# Patient Record
Sex: Female | Born: 1935
Health system: Southern US, Community
[De-identification: ages and names within clinical notes are randomized; demographics above are authoritative.]

## PROBLEM LIST (undated history)

## (undated) DIAGNOSIS — K219 Gastro-esophageal reflux disease without esophagitis: Secondary | ICD-10-CM

## (undated) DIAGNOSIS — C50919 Malignant neoplasm of unspecified site of unspecified female breast: Secondary | ICD-10-CM

## (undated) DIAGNOSIS — D0511 Intraductal carcinoma in situ of right breast: Secondary | ICD-10-CM

## (undated) DIAGNOSIS — I10 Essential (primary) hypertension: Secondary | ICD-10-CM

## (undated) DIAGNOSIS — F0390 Unspecified dementia without behavioral disturbance: Secondary | ICD-10-CM

## (undated) DIAGNOSIS — N183 Chronic kidney disease, stage 3 (moderate): Secondary | ICD-10-CM

## (undated) HISTORY — DX: Chronic kidney disease, stage 3 (moderate): N18.3

## (undated) HISTORY — PX: BACK SURGERY: SHX140

## (undated) HISTORY — PX: OTHER SURGICAL HISTORY: SHX169

## (undated) HISTORY — DX: Malignant neoplasm of unspecified site of unspecified female breast: C50.919

## (undated) HISTORY — DX: Intraductal carcinoma in situ of right breast: D05.11

## (undated) HISTORY — PX: NO PAST SURGERIES: SHX2092

## (undated) HISTORY — DX: Unspecified dementia, unspecified severity, without behavioral disturbance, psychotic disturbance, mood disturbance, and anxiety: F03.90

---

## 2000-07-23 ENCOUNTER — Encounter: Payer: Self-pay | Admitting: Family Medicine

## 2000-07-23 ENCOUNTER — Ambulatory Visit (HOSPITAL_COMMUNITY): Admission: RE | Admit: 2000-07-23 | Discharge: 2000-07-23 | Payer: Self-pay | Admitting: Family Medicine

## 2000-08-20 ENCOUNTER — Encounter: Payer: Self-pay | Admitting: Family Medicine

## 2000-08-20 ENCOUNTER — Ambulatory Visit (HOSPITAL_COMMUNITY): Admission: RE | Admit: 2000-08-20 | Discharge: 2000-08-20 | Payer: Self-pay | Admitting: Family Medicine

## 2000-08-30 ENCOUNTER — Ambulatory Visit (HOSPITAL_COMMUNITY): Admission: RE | Admit: 2000-08-30 | Discharge: 2000-08-30 | Payer: Self-pay | Admitting: Family Medicine

## 2000-08-30 ENCOUNTER — Encounter: Payer: Self-pay | Admitting: Family Medicine

## 2001-07-01 ENCOUNTER — Encounter: Payer: Self-pay | Admitting: Family Medicine

## 2001-07-01 ENCOUNTER — Ambulatory Visit (HOSPITAL_COMMUNITY): Admission: RE | Admit: 2001-07-01 | Discharge: 2001-07-01 | Payer: Self-pay | Admitting: Family Medicine

## 2001-09-09 ENCOUNTER — Ambulatory Visit (HOSPITAL_COMMUNITY): Admission: RE | Admit: 2001-09-09 | Discharge: 2001-09-09 | Payer: Self-pay | Admitting: Obstetrics and Gynecology

## 2001-09-09 ENCOUNTER — Encounter: Payer: Self-pay | Admitting: Obstetrics and Gynecology

## 2002-09-19 ENCOUNTER — Encounter: Payer: Self-pay | Admitting: Family Medicine

## 2002-09-19 ENCOUNTER — Ambulatory Visit (HOSPITAL_COMMUNITY): Admission: RE | Admit: 2002-09-19 | Discharge: 2002-09-19 | Payer: Self-pay | Admitting: Family Medicine

## 2004-01-19 ENCOUNTER — Ambulatory Visit (HOSPITAL_COMMUNITY): Admission: RE | Admit: 2004-01-19 | Discharge: 2004-01-19 | Payer: Self-pay | Admitting: Family Medicine

## 2004-08-17 ENCOUNTER — Ambulatory Visit (HOSPITAL_COMMUNITY): Admission: RE | Admit: 2004-08-17 | Discharge: 2004-08-17 | Payer: Self-pay | Admitting: Family Medicine

## 2005-01-19 ENCOUNTER — Ambulatory Visit (HOSPITAL_COMMUNITY): Admission: RE | Admit: 2005-01-19 | Discharge: 2005-01-19 | Payer: Self-pay | Admitting: Family Medicine

## 2005-05-17 ENCOUNTER — Ambulatory Visit (HOSPITAL_COMMUNITY): Admission: RE | Admit: 2005-05-17 | Discharge: 2005-05-17 | Payer: Self-pay | Admitting: Family Medicine

## 2005-07-10 ENCOUNTER — Ambulatory Visit (HOSPITAL_COMMUNITY): Admission: RE | Admit: 2005-07-10 | Discharge: 2005-07-10 | Payer: Self-pay | Admitting: Family Medicine

## 2006-01-22 ENCOUNTER — Ambulatory Visit (HOSPITAL_COMMUNITY): Admission: RE | Admit: 2006-01-22 | Discharge: 2006-01-22 | Payer: Self-pay | Admitting: Family Medicine

## 2006-02-05 ENCOUNTER — Ambulatory Visit: Payer: Self-pay | Admitting: Internal Medicine

## 2006-02-05 ENCOUNTER — Ambulatory Visit (HOSPITAL_COMMUNITY): Admission: RE | Admit: 2006-02-05 | Discharge: 2006-02-05 | Payer: Self-pay | Admitting: Internal Medicine

## 2006-03-05 ENCOUNTER — Ambulatory Visit (HOSPITAL_COMMUNITY): Admission: RE | Admit: 2006-03-05 | Discharge: 2006-03-05 | Payer: Self-pay | Admitting: Family Medicine

## 2007-01-28 ENCOUNTER — Ambulatory Visit (HOSPITAL_COMMUNITY): Admission: RE | Admit: 2007-01-28 | Discharge: 2007-01-28 | Payer: Self-pay | Admitting: Family Medicine

## 2007-04-09 ENCOUNTER — Ambulatory Visit (HOSPITAL_COMMUNITY): Admission: RE | Admit: 2007-04-09 | Discharge: 2007-04-09 | Payer: Self-pay | Admitting: Family Medicine

## 2007-05-17 ENCOUNTER — Other Ambulatory Visit: Admission: RE | Admit: 2007-05-17 | Discharge: 2007-05-17 | Payer: Self-pay | Admitting: Obstetrics & Gynecology

## 2007-10-29 ENCOUNTER — Encounter: Payer: Self-pay | Admitting: Cardiology

## 2007-10-29 ENCOUNTER — Ambulatory Visit (HOSPITAL_COMMUNITY): Admission: RE | Admit: 2007-10-29 | Discharge: 2007-10-29 | Payer: Self-pay | Admitting: Family Medicine

## 2007-12-04 ENCOUNTER — Ambulatory Visit: Payer: Self-pay | Admitting: Cardiology

## 2007-12-11 ENCOUNTER — Ambulatory Visit: Payer: Self-pay | Admitting: Cardiology

## 2007-12-11 ENCOUNTER — Encounter (HOSPITAL_COMMUNITY): Admission: RE | Admit: 2007-12-11 | Discharge: 2008-01-10 | Payer: Self-pay | Admitting: Cardiology

## 2008-01-29 ENCOUNTER — Ambulatory Visit (HOSPITAL_COMMUNITY): Admission: RE | Admit: 2008-01-29 | Discharge: 2008-01-29 | Payer: Self-pay | Admitting: Family Medicine

## 2008-10-22 DIAGNOSIS — I10 Essential (primary) hypertension: Secondary | ICD-10-CM | POA: Insufficient documentation

## 2008-10-22 DIAGNOSIS — M129 Arthropathy, unspecified: Secondary | ICD-10-CM | POA: Insufficient documentation

## 2009-01-22 ENCOUNTER — Ambulatory Visit (HOSPITAL_COMMUNITY): Admission: RE | Admit: 2009-01-22 | Discharge: 2009-01-22 | Payer: Self-pay | Admitting: Family Medicine

## 2009-01-26 ENCOUNTER — Ambulatory Visit (HOSPITAL_COMMUNITY): Admission: RE | Admit: 2009-01-26 | Discharge: 2009-01-26 | Payer: Self-pay | Admitting: Family Medicine

## 2009-01-31 ENCOUNTER — Emergency Department (HOSPITAL_COMMUNITY): Admission: EM | Admit: 2009-01-31 | Discharge: 2009-01-31 | Payer: Self-pay | Admitting: Emergency Medicine

## 2009-02-09 ENCOUNTER — Ambulatory Visit (HOSPITAL_COMMUNITY): Admission: RE | Admit: 2009-02-09 | Discharge: 2009-02-09 | Payer: Self-pay | Admitting: Emergency Medicine

## 2009-02-17 ENCOUNTER — Ambulatory Visit (HOSPITAL_COMMUNITY): Admission: RE | Admit: 2009-02-17 | Discharge: 2009-02-17 | Payer: Self-pay | Admitting: Family Medicine

## 2010-02-21 ENCOUNTER — Ambulatory Visit (HOSPITAL_COMMUNITY): Admission: RE | Admit: 2010-02-21 | Discharge: 2010-02-21 | Payer: Self-pay | Admitting: Family Medicine

## 2010-04-01 ENCOUNTER — Ambulatory Visit (HOSPITAL_COMMUNITY)
Admission: RE | Admit: 2010-04-01 | Discharge: 2010-04-01 | Payer: Self-pay | Source: Home / Self Care | Attending: Family Medicine | Admitting: Family Medicine

## 2010-07-21 LAB — COMPREHENSIVE METABOLIC PANEL WITH GFR
ALT: 16 U/L (ref 0–35)
AST: 18 U/L (ref 0–37)
Albumin: 4 g/dL (ref 3.5–5.2)
Alkaline Phosphatase: 81 U/L (ref 39–117)
BUN: 15 mg/dL (ref 6–23)
CO2: 32 meq/L (ref 19–32)
Calcium: 9.5 mg/dL (ref 8.4–10.5)
Chloride: 100 meq/L (ref 96–112)
Creatinine, Ser: 0.93 mg/dL (ref 0.4–1.2)
GFR calc non Af Amer: 59 mL/min — ABNORMAL LOW
Glucose, Bld: 114 mg/dL — ABNORMAL HIGH (ref 70–99)
Potassium: 3.8 meq/L (ref 3.5–5.1)
Sodium: 138 meq/L (ref 135–145)
Total Bilirubin: 0.8 mg/dL (ref 0.3–1.2)
Total Protein: 7.4 g/dL (ref 6.0–8.3)

## 2010-07-21 LAB — DIFFERENTIAL
Basophils Relative: 1 % (ref 0–1)
Eosinophils Absolute: 0.4 10*3/uL (ref 0.0–0.7)
Lymphs Abs: 2.3 10*3/uL (ref 0.7–4.0)
Neutro Abs: 2.9 10*3/uL (ref 1.7–7.7)
Neutrophils Relative %: 49 % (ref 43–77)

## 2010-07-21 LAB — CBC
HCT: 42 % (ref 36.0–46.0)
RDW: 12.9 % (ref 11.5–15.5)
WBC: 6 10*3/uL (ref 4.0–10.5)

## 2010-07-21 LAB — LIPASE, BLOOD: Lipase: 33 U/L (ref 11–59)

## 2010-08-30 NOTE — Assessment & Plan Note (Signed)
[facility]                       [place] CARDIOLOGY OFFICE NOTE   Melissa Deleon                       MRN:          [id number]  DATE:12/04/2007                            DOB:          11-14-1933    HISTORY OF PRESENT ILLNESS:  I was asked by Dr. Patrica Duel to consult  on Melissa Deleon, delightful 77 year old married white female with right  arm and right chest aching and pressure.   Melissa Deleon has been in excellent health except for some chronic arthritis  and hypertension.  She has no other major risk factors other than age  for coronary artery disease.   Over the last several months, she has had some aching in her right arm,  starting at her wrist, all the way up to her shoulder, which can go into  right upper part of her chest.  Sometimes this is with activity,  sometimes this is out of the blue.  It has woken her up from sleep.  It  sometimes can last for hours.  She has no nausea, no vomiting,  diaphoresis, or shortness of breath with it.  She has a history of  gastroesophageal reflux, but this is not like that.  She denies any  increase in her reflux.   PAST MEDICAL HISTORY:   ALLERGIES:  She has no known drug allergies.   SOCIAL HISTORY:  She does not smoke or drink.   CURRENT MEDICATIONS:  1. Lisinopril and hydrochlorothiazide 20/12.5 daily.  2. Fosamax 70 mg a day.  3. Aspirin 81 mg a day.  4. Prilosec 20 mg a day.   She has had a colonoscopy in the past.  She has had no other surgery.   SOCIAL HISTORY:  She is retired from [employer].  She used to  soak pillows.  She is married and has 1 child.  Her granddaughter is  with her today who is a former Engineer, civil (consulting) at [employer].   FAMILY HISTORY:  Negative for premature coronary artery disease.  Her  father did die of a heart attack at age 26.   REVIEW OF SYSTEMS:  She wears glasses.  The rest of her review of  systems are totally negative and all her question.  Please  refer to our  diagnostic evaluation form.   PHYSICAL EXAMINATION:  GENERAL:  She is in acute distress.  She is a  well-developed, well-nourished lady.  VITAL SIGNS:  Her blood pressure is 120/80, her pulse is 76 and regular.  She weighs 192 pounds.  HEENT:  Normocephalic and atraumatic.  PERRLA, extraocular movements  intact.  Sclerae are clear.  Facial symmetry is normal.  NECK:  Carotid upstrokes are equal bilaterally without bruits.  No JVD.  Thyroid is not enlarged.  Trachea is midline.  Supple.  LUNGS:  Clear to auscultation and percussion.  HEART:  Regular rate and rhythm.  PMI is hard to hear.  S2 is split  physiologically.  There is no murmur.  ABDOMEN:  Soft.  Good bowel sounds.  No midline bruits.  No pulsatile  mass.  No hepatomegaly.  EXTREMITIES:  No  cyanosis, clubbing, or edema.  Pulses are 2+/4+, both  posterior tibial and dorsalis pedis.  She has some varicose veins with  superficial valve showing.  There is no sign of cellulitis, thrombosis,  or phlebitis.  NEURO:  Intact.  MUSCULOSKELETAL:  No obvious joint deformities.  She has some chronic  arthritic changes.  SKIN:  Unremarkable.   EKG shows sinus rhythm with a low voltage QRS.  She has a little bit of  a left axis deviation.  She has a pseudo inferior infarct pattern.  She  has poor R-wave progression across the anterior precordium and she has  low voltage.  She had a chest x-ray on October 29, 2007, which showed no  acute findings.  She has a large hiatal hernia.  She has some  compression of her lower thoracic vertebrae, but nothing seen in her  lower cervical spine.  Heart size was normal.   ASSESSMENT:  Chest tightness and right arm throbbing, which has some  typical and atypical features of coronary artery disease, considering  her age, history of hypertension, and her active lifestyle, I have asked  her to undergo an adenosine Myoview to rule out obstructive coronary  artery disease.  Indication, risk,  and potential benefits were  discussed.  She agrees to proceed.  I have made no changes in her  medical program.     Jesse Sans. Daleen Squibb, MD, [facility]  Electronically Signed    TCW/MedQ  DD: 12/04/2007  DT: 12/05/2007  Job #: [id number]   cc:   Patrica Duel, M.D.

## 2010-09-02 NOTE — Op Note (Signed)
Melissa Deleon, Melissa Deleon                ACCOUNT NO.:  [id number]   MEDICAL RECORD NO.:  [id number]          PATIENT TYPE:  AMB   LOCATION:  DAY                           FACILITY:  [facility]   PHYSICIAN:  R. Roetta Sessions, M.D. DATE OF BIRTH:  01-14-1934   DATE OF PROCEDURE:  02/05/2006  DATE OF DISCHARGE:                                 OPERATIVE REPORT   INDICATIONS FOR PROCEDURE:  The patient is 77 year old lady sent over  courtesy Dr. Nobie Putnam.  She is devoid of any lower GI tract symptoms.  She  had an unremarkable colonoscopy back in 1998.  There is no family history  colorectal neoplasia.  She is referred for routine screening.  This approach  has been discussed with patient at length.  Potential risks, benefits,  alternatives have been reviewed, questions answered.  She is agreeable.  Please see documentation medical record.   PROCEDURE NOTE:  O2 saturation, blood pressure, pulse, respirations were  monitored the entire procedure.   CONSCIOUS SEDATION:  Versed 4 mg IV and Demerol 100 mg IV in divided doses.   INSTRUMENT:  Olympus video chip system.   FINDINGS:  Digital rectal exam revealed no abnormalities.   ENDOSCOPIC FINDINGS:  Prep was adequate.  Rectum:  About rectal mucosa  including retroflex anal verge revealed no abnormalities.  Colon:  Colonic mucosa was surveyed from rectosigmoid junction through the  left, transverse, right, colon to area of appendiceal orifice, ileocecal  valve and cecum.  These structures well seen.  Photographed for the record.  From this level the scope was slowly withdrawn.  All previous mentioned  mucosal surfaces were again seen.  The patient had shallow narrow mouthed  left-sided diverticula scattered. Otherwise colonic mucosa appeared entirely  normal.  The patient tolerated the procedure well, was reacted endoscopy.   IMPRESSION:  Normal rectum, few scattered left-sided diverticula, remainder  colon mucosa appeared normal.   RECOMMENDATIONS:  1. Diverticulosis literature provided to Ms. Roderic Scarce.  2. Consider repeat screening colonoscopy 10 years.      Jonathon Bellows, M.D.  Electronically Signed     RMR/MEDQ  D:  02/05/2006  T:  02/05/2006  Job:  [id number]   cc:   Patrica Duel, M.D.  Fax: [phone]

## 2011-01-16 ENCOUNTER — Other Ambulatory Visit (HOSPITAL_COMMUNITY): Payer: Self-pay | Admitting: Internal Medicine

## 2011-01-16 DIAGNOSIS — Z139 Encounter for screening, unspecified: Secondary | ICD-10-CM

## 2011-01-30 ENCOUNTER — Other Ambulatory Visit (HOSPITAL_COMMUNITY): Payer: Self-pay | Admitting: Physician Assistant

## 2011-01-30 DIAGNOSIS — Z0000 Encounter for general adult medical examination without abnormal findings: Secondary | ICD-10-CM

## 2011-01-30 DIAGNOSIS — M810 Age-related osteoporosis without current pathological fracture: Secondary | ICD-10-CM

## 2011-01-30 DIAGNOSIS — M159 Polyosteoarthritis, unspecified: Secondary | ICD-10-CM

## 2011-02-02 ENCOUNTER — Other Ambulatory Visit (HOSPITAL_COMMUNITY)
Admission: RE | Admit: 2011-02-02 | Discharge: 2011-02-02 | Disposition: A | Discharge status: Home / Self Care | Payer: Medicare Other | Source: Ambulatory Visit | Attending: Obstetrics & Gynecology | Admitting: Obstetrics & Gynecology

## 2011-02-02 ENCOUNTER — Other Ambulatory Visit: Payer: Self-pay | Admitting: Obstetrics & Gynecology

## 2011-02-02 DIAGNOSIS — Z01419 Encounter for gynecological examination (general) (routine) without abnormal findings: Secondary | ICD-10-CM | POA: Insufficient documentation

## 2011-02-24 ENCOUNTER — Other Ambulatory Visit (HOSPITAL_COMMUNITY): Payer: Self-pay

## 2011-02-24 ENCOUNTER — Ambulatory Visit (HOSPITAL_COMMUNITY)
Admission: RE | Admit: 2011-02-24 | Discharge: 2011-02-24 | Disposition: A | Discharge status: Home / Self Care | Payer: Medicare Other | Source: Ambulatory Visit | Attending: Internal Medicine | Admitting: Internal Medicine

## 2011-02-24 DIAGNOSIS — Z139 Encounter for screening, unspecified: Secondary | ICD-10-CM

## 2011-02-24 DIAGNOSIS — Z1231 Encounter for screening mammogram for malignant neoplasm of breast: Secondary | ICD-10-CM | POA: Insufficient documentation

## 2011-06-22 ENCOUNTER — Encounter (HOSPITAL_COMMUNITY): Admission: RE | Admit: 2011-06-22 | Payer: Medicare Other | Source: Ambulatory Visit

## 2011-06-28 ENCOUNTER — Encounter (HOSPITAL_COMMUNITY): Admission: RE | Payer: Self-pay | Source: Ambulatory Visit

## 2011-06-28 ENCOUNTER — Ambulatory Visit (HOSPITAL_COMMUNITY)
Admission: RE | Admit: 2011-06-28 | Payer: Medicare Other | Source: Ambulatory Visit | Admitting: Obstetrics & Gynecology

## 2011-06-28 SURGERY — HYSTERECTOMY, VAGINAL
Anesthesia: General

## 2011-10-06 ENCOUNTER — Ambulatory Visit (HOSPITAL_COMMUNITY)
Admission: RE | Admit: 2011-10-06 | Discharge: 2011-10-06 | Disposition: A | Discharge status: Home / Self Care | Payer: Medicare Other | Source: Ambulatory Visit | Attending: Physician Assistant | Admitting: Physician Assistant

## 2011-10-06 DIAGNOSIS — Z780 Asymptomatic menopausal state: Secondary | ICD-10-CM | POA: Insufficient documentation

## 2011-10-06 DIAGNOSIS — Z0000 Encounter for general adult medical examination without abnormal findings: Secondary | ICD-10-CM

## 2011-10-06 DIAGNOSIS — M159 Polyosteoarthritis, unspecified: Secondary | ICD-10-CM

## 2011-10-06 DIAGNOSIS — M810 Age-related osteoporosis without current pathological fracture: Secondary | ICD-10-CM

## 2011-12-29 ENCOUNTER — Other Ambulatory Visit (HOSPITAL_COMMUNITY): Payer: Self-pay | Admitting: Family Medicine

## 2011-12-29 ENCOUNTER — Ambulatory Visit (HOSPITAL_COMMUNITY)
Admission: RE | Admit: 2011-12-29 | Discharge: 2011-12-29 | Disposition: A | Discharge status: Home / Self Care | Payer: Medicare Other | Source: Ambulatory Visit | Attending: Family Medicine | Admitting: Family Medicine

## 2011-12-29 DIAGNOSIS — M5430 Sciatica, unspecified side: Secondary | ICD-10-CM

## 2011-12-29 DIAGNOSIS — M25559 Pain in unspecified hip: Secondary | ICD-10-CM

## 2012-01-22 ENCOUNTER — Other Ambulatory Visit (HOSPITAL_COMMUNITY): Payer: Self-pay | Admitting: Family Medicine

## 2012-01-22 DIAGNOSIS — Z139 Encounter for screening, unspecified: Secondary | ICD-10-CM

## 2012-01-31 ENCOUNTER — Other Ambulatory Visit (HOSPITAL_COMMUNITY): Payer: Self-pay | Admitting: Orthopaedic Surgery

## 2012-01-31 DIAGNOSIS — M545 Low back pain, unspecified: Secondary | ICD-10-CM

## 2012-02-06 ENCOUNTER — Ambulatory Visit (HOSPITAL_COMMUNITY)
Admission: RE | Admit: 2012-02-06 | Discharge: 2012-02-06 | Disposition: A | Discharge status: Home / Self Care | Payer: Medicare Other | Source: Ambulatory Visit | Attending: Orthopaedic Surgery | Admitting: Orthopaedic Surgery

## 2012-02-06 DIAGNOSIS — M79609 Pain in unspecified limb: Secondary | ICD-10-CM | POA: Insufficient documentation

## 2012-02-06 DIAGNOSIS — M545 Low back pain, unspecified: Secondary | ICD-10-CM | POA: Insufficient documentation

## 2012-02-06 DIAGNOSIS — M48061 Spinal stenosis, lumbar region without neurogenic claudication: Secondary | ICD-10-CM | POA: Insufficient documentation

## 2012-02-27 ENCOUNTER — Ambulatory Visit (HOSPITAL_COMMUNITY)
Admission: RE | Admit: 2012-02-27 | Discharge: 2012-02-27 | Disposition: A | Discharge status: Home / Self Care | Payer: Medicare Other | Source: Ambulatory Visit | Attending: Family Medicine | Admitting: Family Medicine

## 2012-02-27 DIAGNOSIS — Z139 Encounter for screening, unspecified: Secondary | ICD-10-CM

## 2012-02-27 DIAGNOSIS — Z1231 Encounter for screening mammogram for malignant neoplasm of breast: Secondary | ICD-10-CM | POA: Insufficient documentation

## 2012-02-29 ENCOUNTER — Other Ambulatory Visit: Payer: Self-pay | Admitting: Neurosurgery

## 2012-03-05 ENCOUNTER — Encounter (HOSPITAL_COMMUNITY)
Admission: RE | Admit: 2012-03-05 | Discharge: 2012-03-05 | Disposition: A | Discharge status: Home / Self Care | Payer: Medicare Other | Source: Ambulatory Visit | Attending: Neurosurgery | Admitting: Neurosurgery

## 2012-03-05 ENCOUNTER — Encounter (HOSPITAL_COMMUNITY): Payer: Self-pay

## 2012-03-05 ENCOUNTER — Encounter (HOSPITAL_COMMUNITY)
Admission: RE | Admit: 2012-03-05 | Discharge: 2012-03-05 | Disposition: A | Discharge status: Home / Self Care | Payer: Medicare Other | Source: Ambulatory Visit | Attending: Anesthesiology | Admitting: Anesthesiology

## 2012-03-05 HISTORY — DX: Gastro-esophageal reflux disease without esophagitis: K21.9

## 2012-03-05 HISTORY — DX: Essential (primary) hypertension: I10

## 2012-03-05 LAB — URINALYSIS, ROUTINE W REFLEX MICROSCOPIC
Glucose, UA: NEGATIVE mg/dL
Hgb urine dipstick: NEGATIVE
Ketones, ur: NEGATIVE mg/dL
Protein, ur: NEGATIVE mg/dL

## 2012-03-05 LAB — BASIC METABOLIC PANEL
BUN: 13 mg/dL (ref 6–23)
Chloride: 96 mEq/L (ref 96–112)
GFR calc Af Amer: 76 mL/min — ABNORMAL LOW (ref >90)
Potassium: 3.8 mEq/L (ref 3.5–5.1)

## 2012-03-05 LAB — CBC WITH DIFFERENTIAL/PLATELET
Eosinophils Absolute: 0.5 10*3/uL (ref 0.0–0.7)
Eosinophils Relative: 7 % — ABNORMAL HIGH (ref 0–5)
Hemoglobin: 13.2 g/dL (ref 12.0–15.0)
Lymphs Abs: 2.6 10*3/uL (ref 0.7–4.0)
MCH: 29.5 pg (ref 26.0–34.0)
MCV: 89.1 fL (ref 78.0–100.0)
Monocytes Relative: 11 % (ref 3–12)
RBC: 4.48 MIL/uL (ref 3.87–5.11)

## 2012-03-05 LAB — URINE MICROSCOPIC-ADD ON

## 2012-03-05 LAB — PROTIME-INR: INR: 0.98 (ref 0.00–1.49)

## 2012-03-05 LAB — SURGICAL PCR SCREEN: MRSA, PCR: NEGATIVE

## 2012-03-05 LAB — APTT: aPTT: 29 seconds (ref 24–37)

## 2012-03-05 NOTE — Progress Notes (Signed)
[facility] STATED THEY DID NOT HAVE STRESS TEST.

## 2012-03-05 NOTE — Pre-Procedure Instructions (Signed)
20 Melissa Deleon  03/05/2012   Your procedure is scheduled MW:[id number]  03/07/12     Report to [facility] at 930 AM.  Call this number if you have problems the morning of surgery: [phone]   Remember:   Do not eat food OR DRINK :After Midnight.  Take these medicines the morning of surgery with A SIP OF WATER: (STOP ASPIRIN, COUMADIN, PLAVIX, EFFIENT, HERBAL MEDICINES)   Do not wear jewelry, make-up or nail polish.  Do not wear lotions, powders, or perfumes. You may wear deodorant.  Do not shave 48 hours prior to surgery. Men may shave face and neck.  Do not bring valuables to the hospital.  Contacts, dentures or bridgework may not be worn into surgery.  Leave suitcase in the car. After surgery it may be brought to your room.  For patients admitted to the hospital, checkout time is 11:00 AM the day of discharge.   Patients discharged the day of surgery will not be allowed to drive home.  Name and phone number of your driver:  Special Instructions: Shower using CHG 2 nights before surgery and the night before surgery.  If you shower the day of surgery use CHG.  Use special wash - you have one bottle of CHG for all showers.  You should use approximately 1/3 of the bottle for each shower.   Please read over the following fact sheets that you were given: Pain Booklet, Coughing and Deep Breathing, MRSA Information and Surgical Site Infection Prevention

## 2012-03-06 ENCOUNTER — Encounter (HOSPITAL_COMMUNITY): Payer: Self-pay | Admitting: Pharmacy Technician

## 2012-03-06 LAB — URINE CULTURE: Colony Count: >=100000

## 2012-03-06 MED ORDER — CEFAZOLIN SODIUM-DEXTROSE 2-3 GM-% IV SOLR
2.0000 g | INTRAVENOUS | Status: AC
  Administered 2012-03-07: 2 g via INTRAVENOUS
  Filled 2012-03-06: qty 50

## 2012-03-06 NOTE — Consult Note (Signed)
Anesthesia chart review: Patient is a 78 year old female scheduled for left L4-5 laminectomy for synovial cyst by Dr. Phoebe Perch on 03/07/2012.Marland Kitchen  History includes HTN, GERD, non-smoker.  EKG on 03/05/12 showed NSR, poor r wave progression, cannot rule out anterior infarct (age undetermined).  It appears stable since her EKG from 12/04/07.  She was evaluated by Cardiologist Dr. Daleen Squibb in August 2009 for right sided chest aching and pressure.  He ordered a nuclear stress test (12/11/07) that showed: Probably negative stress nuclear myocardial study revealing no significant stress - induced EKG abnormalities, normal left ventricular size and normal left ventricular systolic function. By scintigraphic imaging, there was a minor posterolateral and apical abnormality that could reflect variable breast attenuation artifact. The possibility of a small degree of ischemia in this region cannot be unequivocally excluded. EF 62%.  Chest x-ray on 03/05/2012 showed moderately large hiatal hernia. No acute cardiopulmonary disease.  Preoperative labs noted. Urine culture shows >100,000 colonies, none predominant.  WBC normal.  (I left a voicemail regarding labs with Shanda Bumps at [facility].)  No CV symptoms were documented at her PAT visit.  Clinical correlation of the day of surgery.  If remains asymptomatic then anticipate she can proceed as planned.  Shonna Chock, PA-C 03/06/12 1332

## 2012-03-07 ENCOUNTER — Encounter (HOSPITAL_COMMUNITY)
Admission: RE | Disposition: A | Discharge status: Home / Self Care | Payer: Self-pay | Source: Ambulatory Visit | Attending: Neurosurgery

## 2012-03-07 ENCOUNTER — Encounter (HOSPITAL_COMMUNITY): Payer: Self-pay | Admitting: *Deleted

## 2012-03-07 ENCOUNTER — Encounter (HOSPITAL_COMMUNITY): Payer: Self-pay | Admitting: Vascular Surgery

## 2012-03-07 ENCOUNTER — Observation Stay (HOSPITAL_COMMUNITY)
Admission: RE | Admit: 2012-03-07 | Discharge: 2012-03-08 | Disposition: A | Discharge status: Home / Self Care | Payer: Medicare Other | Source: Ambulatory Visit | Attending: Neurosurgery | Admitting: Neurosurgery

## 2012-03-07 ENCOUNTER — Ambulatory Visit (HOSPITAL_COMMUNITY): Payer: Medicare Other

## 2012-03-07 ENCOUNTER — Ambulatory Visit (HOSPITAL_COMMUNITY): Payer: Medicare Other | Admitting: Vascular Surgery

## 2012-03-07 DIAGNOSIS — Z01810 Encounter for preprocedural cardiovascular examination: Secondary | ICD-10-CM | POA: Insufficient documentation

## 2012-03-07 DIAGNOSIS — Z01812 Encounter for preprocedural laboratory examination: Secondary | ICD-10-CM | POA: Insufficient documentation

## 2012-03-07 DIAGNOSIS — I10 Essential (primary) hypertension: Secondary | ICD-10-CM | POA: Insufficient documentation

## 2012-03-07 DIAGNOSIS — K219 Gastro-esophageal reflux disease without esophagitis: Secondary | ICD-10-CM | POA: Insufficient documentation

## 2012-03-07 DIAGNOSIS — M7130 Other bursal cyst, unspecified site: Principal | ICD-10-CM | POA: Insufficient documentation

## 2012-03-07 HISTORY — PX: LUMBAR LAMINECTOMY/DECOMPRESSION MICRODISCECTOMY: SHX5026

## 2012-03-07 SURGERY — LUMBAR LAMINECTOMY/DECOMPRESSION MICRODISCECTOMY 1 LEVEL
Anesthesia: General | Site: Spine Lumbar | Laterality: Left | Wound class: Clean

## 2012-03-07 MED ORDER — ACETAMINOPHEN 650 MG RE SUPP: 650.0000 mg | RECTAL | Status: DC | PRN

## 2012-03-07 MED ORDER — PROPOFOL 10 MG/ML IV BOLUS
INTRAVENOUS | Status: DC | PRN
  Administered 2012-03-07: 140 mg via INTRAVENOUS

## 2012-03-07 MED ORDER — METHOCARBAMOL 500 MG PO TABS: 500.0000 mg | ORAL_TABLET | Freq: Four times a day (QID) | ORAL | Status: DC | PRN

## 2012-03-07 MED ORDER — LISINOPRIL 20 MG PO TABS
20.0000 mg | ORAL_TABLET | Freq: Every day | ORAL | Status: DC
Start: 2012-03-08 — End: 2012-03-08
  Filled 2012-03-07: qty 1

## 2012-03-07 MED ORDER — CYCLOBENZAPRINE HCL 10 MG PO TABS
10.0000 mg | ORAL_TABLET | Freq: Three times a day (TID) | ORAL | Status: DC | PRN
  Administered 2012-03-07: 10 mg via ORAL
  Filled 2012-03-07: qty 1

## 2012-03-07 MED ORDER — METHOCARBAMOL 100 MG/ML IJ SOLN
500.0000 mg | Freq: Four times a day (QID) | INTRAVENOUS | Status: DC | PRN
  Filled 2012-03-07: qty 5

## 2012-03-07 MED ORDER — ZOLPIDEM TARTRATE 5 MG PO TABS: 5.0000 mg | ORAL_TABLET | Freq: Every evening | ORAL | Status: DC | PRN

## 2012-03-07 MED ORDER — ROCURONIUM BROMIDE 100 MG/10ML IV SOLN
INTRAVENOUS | Status: DC | PRN
  Administered 2012-03-07: 35 mg via INTRAVENOUS

## 2012-03-07 MED ORDER — SODIUM CHLORIDE 0.9 % IJ SOLN: 3.0000 mL | INTRAMUSCULAR | Status: DC | PRN

## 2012-03-07 MED ORDER — PROMETHAZINE HCL 25 MG/ML IJ SOLN: 12.5000 mg | INTRAMUSCULAR | Status: DC | PRN

## 2012-03-07 MED ORDER — NEOSTIGMINE METHYLSULFATE 1 MG/ML IJ SOLN
INTRAMUSCULAR | Status: DC | PRN
  Administered 2012-03-07: 3 mg via INTRAVENOUS

## 2012-03-07 MED ORDER — OXYBUTYNIN CHLORIDE ER 10 MG PO TB24
10.0000 mg | ORAL_TABLET | Freq: Every day | ORAL | Status: DC
  Administered 2012-03-07: 10 mg via ORAL
  Filled 2012-03-07 (×2): qty 1

## 2012-03-07 MED ORDER — 0.9 % SODIUM CHLORIDE (POUR BTL) OPTIME
TOPICAL | Status: DC | PRN
  Administered 2012-03-07: 1000 mL

## 2012-03-07 MED ORDER — SODIUM CHLORIDE 0.9 % IJ SOLN
3.0000 mL | Freq: Two times a day (BID) | INTRAMUSCULAR | Status: DC
  Administered 2012-03-07: 3 mL via INTRAVENOUS

## 2012-03-07 MED ORDER — SODIUM CHLORIDE 0.9 % IV SOLN
INTRAVENOUS | Status: AC
  Filled 2012-03-07: qty 500

## 2012-03-07 MED ORDER — PROMETHAZINE HCL 25 MG PO TABS: 12.5000 mg | ORAL_TABLET | ORAL | Status: DC | PRN

## 2012-03-07 MED ORDER — MORPHINE SULFATE 2 MG/ML IJ SOLN: 1.0000 mg | INTRAMUSCULAR | Status: DC | PRN

## 2012-03-07 MED ORDER — KETOROLAC TROMETHAMINE 30 MG/ML IJ SOLN
30.0000 mg | Freq: Four times a day (QID) | INTRAMUSCULAR | Status: DC
  Administered 2012-03-07 – 2012-03-08 (×2): 30 mg via INTRAVENOUS
  Filled 2012-03-07 (×5): qty 1

## 2012-03-07 MED ORDER — BISACODYL 10 MG RE SUPP: 10.0000 mg | Freq: Every day | RECTAL | Status: DC | PRN

## 2012-03-07 MED ORDER — BACITRACIN 50000 UNITS IM SOLR
INTRAMUSCULAR | Status: AC
  Filled 2012-03-07: qty 1

## 2012-03-07 MED ORDER — ACETAMINOPHEN 325 MG PO TABS: 650.0000 mg | ORAL_TABLET | ORAL | Status: DC | PRN

## 2012-03-07 MED ORDER — FENTANYL CITRATE 0.05 MG/ML IJ SOLN
INTRAMUSCULAR | Status: DC | PRN
  Administered 2012-03-07 (×3): 50 ug via INTRAVENOUS
  Administered 2012-03-07: 100 ug via INTRAVENOUS

## 2012-03-07 MED ORDER — MAGNESIUM HYDROXIDE 400 MG/5ML PO SUSP: 30.0000 mL | Freq: Every day | ORAL | Status: DC | PRN

## 2012-03-07 MED ORDER — SODIUM CHLORIDE 0.9 % IR SOLN
Status: DC | PRN
  Administered 2012-03-07: 14:00:00

## 2012-03-07 MED ORDER — ARTIFICIAL TEARS OP OINT
TOPICAL_OINTMENT | OPHTHALMIC | Status: DC | PRN
  Administered 2012-03-07: 1 via OPHTHALMIC

## 2012-03-07 MED ORDER — LISINOPRIL-HYDROCHLOROTHIAZIDE 20-25 MG PO TABS: 1.0000 | ORAL_TABLET | Freq: Every day | ORAL | Status: DC

## 2012-03-07 MED ORDER — OXYCODONE-ACETAMINOPHEN 5-325 MG PO TABS: 1.0000 | ORAL_TABLET | ORAL | Status: DC | PRN

## 2012-03-07 MED ORDER — ALENDRONATE SODIUM 70 MG PO TABS: 70.0000 mg | ORAL_TABLET | ORAL | Status: DC

## 2012-03-07 MED ORDER — HYDROMORPHONE HCL PF 1 MG/ML IJ SOLN: 0.2500 mg | INTRAMUSCULAR | Status: DC | PRN

## 2012-03-07 MED ORDER — HYDROCHLOROTHIAZIDE 25 MG PO TABS
25.0000 mg | ORAL_TABLET | Freq: Every day | ORAL | Status: DC
  Filled 2012-03-07: qty 1

## 2012-03-07 MED ORDER — LACTATED RINGERS IV SOLN: INTRAVENOUS | Status: DC

## 2012-03-07 MED ORDER — THROMBIN 5000 UNITS EX KIT
PACK | CUTANEOUS | Status: DC | PRN
  Administered 2012-03-07 (×2): 5000 [IU] via TOPICAL

## 2012-03-07 MED ORDER — EPHEDRINE SULFATE 50 MG/ML IJ SOLN
INTRAMUSCULAR | Status: DC | PRN
  Administered 2012-03-07: 10 mg via INTRAVENOUS
  Administered 2012-03-07 (×3): 5 mg via INTRAVENOUS

## 2012-03-07 MED ORDER — LIDOCAINE-EPINEPHRINE 1 %-1:100000 IJ SOLN
INTRAMUSCULAR | Status: DC | PRN
  Administered 2012-03-07: 20 mL

## 2012-03-07 MED ORDER — ONDANSETRON HCL 4 MG/2ML IJ SOLN
INTRAMUSCULAR | Status: DC | PRN
  Administered 2012-03-07: 4 mg via INTRAVENOUS

## 2012-03-07 MED ORDER — LIDOCAINE HCL 4 % MT SOLN
OROMUCOSAL | Status: DC | PRN
  Administered 2012-03-07: 4 mL via TOPICAL

## 2012-03-07 MED ORDER — HEMOSTATIC AGENTS (NO CHARGE) OPTIME
TOPICAL | Status: DC | PRN
  Administered 2012-03-07: 1 via TOPICAL

## 2012-03-07 MED ORDER — TRAMADOL HCL 50 MG PO TABS
50.0000 mg | ORAL_TABLET | Freq: Four times a day (QID) | ORAL | Status: DC | PRN
  Filled 2012-03-07: qty 1

## 2012-03-07 MED ORDER — DOCUSATE SODIUM 100 MG PO CAPS
100.0000 mg | ORAL_CAPSULE | Freq: Two times a day (BID) | ORAL | Status: DC
  Administered 2012-03-07 – 2012-03-08 (×2): 100 mg via ORAL
  Filled 2012-03-07 (×2): qty 1

## 2012-03-07 MED ORDER — CEFAZOLIN SODIUM 1-5 GM-% IV SOLN
1.0000 g | Freq: Three times a day (TID) | INTRAVENOUS | Status: AC
  Administered 2012-03-07 – 2012-03-08 (×2): 1 g via INTRAVENOUS
  Filled 2012-03-07 (×2): qty 50

## 2012-03-07 MED ORDER — ONDANSETRON HCL 4 MG/2ML IJ SOLN: 4.0000 mg | INTRAMUSCULAR | Status: DC | PRN

## 2012-03-07 MED ORDER — LIDOCAINE HCL (CARDIAC) 20 MG/ML IV SOLN
INTRAVENOUS | Status: DC | PRN
  Administered 2012-03-07: 80 mg via INTRAVENOUS

## 2012-03-07 MED ORDER — HYDROCODONE-ACETAMINOPHEN 5-325 MG PO TABS
1.0000 | ORAL_TABLET | ORAL | Status: DC | PRN
  Administered 2012-03-08: 1 via ORAL
  Filled 2012-03-07: qty 1

## 2012-03-07 MED ORDER — ONDANSETRON HCL 4 MG/2ML IJ SOLN: 4.0000 mg | Freq: Once | INTRAMUSCULAR | Status: DC | PRN

## 2012-03-07 MED ORDER — GLYCOPYRROLATE 0.2 MG/ML IJ SOLN
INTRAMUSCULAR | Status: DC | PRN
  Administered 2012-03-07: 0.4 mg via INTRAVENOUS

## 2012-03-07 MED ORDER — LACTATED RINGERS IV SOLN
INTRAVENOUS | Status: DC | PRN
  Administered 2012-03-07 (×2): via INTRAVENOUS

## 2012-03-07 SURGICAL SUPPLY — 56 items
APL SKNCLS STERI-STRIP NONHPOA (GAUZE/BANDAGES/DRESSINGS) ×1
BAG DECANTER FOR FLEXI CONT (MISCELLANEOUS) ×2 IMPLANT
BENZOIN TINCTURE PRP APPL 2/3 (GAUZE/BANDAGES/DRESSINGS) ×2 IMPLANT
BLADE SURG ROTATE 9660 (MISCELLANEOUS) IMPLANT
BUR ROUND FLUTED 5 RND (BURR) ×2 IMPLANT
CANISTER SUCTION 2500CC (MISCELLANEOUS) ×2 IMPLANT
CLOTH BEACON ORANGE TIMEOUT ST (SAFETY) ×2 IMPLANT
CONT SPEC 4OZ CLIKSEAL STRL BL (MISCELLANEOUS) ×2 IMPLANT
DRAPE LAPAROTOMY 100X72X124 (DRAPES) ×2 IMPLANT
DRAPE MICROSCOPE LEICA (MISCELLANEOUS) ×2 IMPLANT
DRAPE POUCH INSTRU U-SHP 10X18 (DRAPES) ×2 IMPLANT
DRAPE SURG 17X23 STRL (DRAPES) ×2 IMPLANT
DRESSING TELFA 8X3 (GAUZE/BANDAGES/DRESSINGS) ×2 IMPLANT
DURAPREP 26ML APPLICATOR (WOUND CARE) ×2 IMPLANT
ELECT REM PT RETURN 9FT ADLT (ELECTROSURGICAL) ×2
ELECTRODE REM PT RTRN 9FT ADLT (ELECTROSURGICAL) ×1 IMPLANT
GAUZE SPONGE 4X4 16PLY XRAY LF (GAUZE/BANDAGES/DRESSINGS) IMPLANT
GLOVE BIO SURGEON STRL SZ8 (GLOVE) ×1 IMPLANT
GLOVE BIOGEL PI IND STRL 8.5 (GLOVE) ×1 IMPLANT
GLOVE BIOGEL PI INDICATOR 8.5 (GLOVE) ×1
GLOVE ECLIPSE 7.5 STRL STRAW (GLOVE) ×2 IMPLANT
GLOVE EXAM NITRILE LRG STRL (GLOVE) IMPLANT
GLOVE EXAM NITRILE MD LF STRL (GLOVE) IMPLANT
GLOVE EXAM NITRILE XL STR (GLOVE) IMPLANT
GLOVE EXAM NITRILE XS STR PU (GLOVE) IMPLANT
GLOVE INDICATOR 7.0 STRL GRN (GLOVE) ×1 IMPLANT
GLOVE SS BIOGEL STRL SZ 6.5 (GLOVE) IMPLANT
GLOVE SUPERSENSE BIOGEL SZ 6.5 (GLOVE) ×3
GOWN BRE IMP SLV AUR LG STRL (GOWN DISPOSABLE) ×3 IMPLANT
GOWN BRE IMP SLV AUR XL STRL (GOWN DISPOSABLE) ×2 IMPLANT
GOWN STRL REIN 2XL LVL4 (GOWN DISPOSABLE) IMPLANT
KIT BASIN OR (CUSTOM PROCEDURE TRAY) ×2 IMPLANT
KIT ROOM TURNOVER OR (KITS) ×2 IMPLANT
NEEDLE HYPO 18GX1.5 BLUNT FILL (NEEDLE) IMPLANT
NEEDLE HYPO 22GX1.5 SAFETY (NEEDLE) ×4 IMPLANT
NS IRRIG 1000ML POUR BTL (IV SOLUTION) ×2 IMPLANT
PACK LAMINECTOMY NEURO (CUSTOM PROCEDURE TRAY) ×2 IMPLANT
PAD ARMBOARD 7.5X6 YLW CONV (MISCELLANEOUS) ×6 IMPLANT
PATTIES SURGICAL .75X.75 (GAUZE/BANDAGES/DRESSINGS) ×2 IMPLANT
RUBBERBAND STERILE (MISCELLANEOUS) ×4 IMPLANT
SPONGE GAUZE 4X4 12PLY (GAUZE/BANDAGES/DRESSINGS) ×2 IMPLANT
SPONGE LAP 4X18 X RAY DECT (DISPOSABLE) IMPLANT
SPONGE SURGIFOAM ABS GEL SZ50 (HEMOSTASIS) ×2 IMPLANT
STRIP CLOSURE SKIN 1/2X4 (GAUZE/BANDAGES/DRESSINGS) ×2 IMPLANT
SUT PROLENE 6 0 BV (SUTURE) IMPLANT
SUT VIC AB 0 CT1 18XCR BRD8 (SUTURE) ×1 IMPLANT
SUT VIC AB 0 CT1 8-18 (SUTURE) ×2
SUT VIC AB 2-0 CP2 18 (SUTURE) ×2 IMPLANT
SUT VIC AB 3-0 SH 8-18 (SUTURE) ×2 IMPLANT
SYR 20CC LL (SYRINGE) ×2 IMPLANT
SYR 20ML ECCENTRIC (SYRINGE) ×1 IMPLANT
SYR 5ML LL (SYRINGE) IMPLANT
TAPE CLOTH SURG 4X10 WHT LF (GAUZE/BANDAGES/DRESSINGS) ×1 IMPLANT
TOWEL OR 17X24 6PK STRL BLUE (TOWEL DISPOSABLE) ×2 IMPLANT
TOWEL OR 17X26 10 PK STRL BLUE (TOWEL DISPOSABLE) ×2 IMPLANT
WATER STERILE IRR 1000ML POUR (IV SOLUTION) ×2 IMPLANT

## 2012-03-07 NOTE — Op Note (Signed)
03/07/2012  2:59 PM  PATIENT:  Melissa Deleon  78 y.o. female  PRE-OPERATIVE DIAGNOSIS:  Synovial cyst, Lumbar stenosis  POST-OPERATIVE DIAGNOSIS:  Synovial cyst, Lumbar stenosis  PROCEDURE:  Procedure(s): lumabar laminictomy for epidural mass (synovial cyst) - Left L4-5 , microdisection  SURGEON:  Surgeon(s): Clydene Fake, MD Maeola Harman, MD- assist   ANESTHESIA:   general  EBL:  Total I/O In: 1200 [I.V.:1200] Out: 50 [Blood:50]  BLOOD ADMINISTERED:none  DRAINS: none   SPECIMEN:  Excision  DICTATION: Patient having back and left leg pain and numbness greatest depth with steroids did not help to the lower NSAIDs. MRI done showing multiple spinal changes at the left-sided 45 facet hypertrophy large epidural mass probable synovial cyst. Patient brought in for decompressive lamina resection epidural mass.  ) Operating room general anesthesia induced patient placed in prone position Wilson frame all pressure points padded. He has been draped sterile fashion segments inject with 20 cc 1% lidocaine with epinephrine. Needle was placed and x-rays attention needle was putting at the L4 spinous process incision was made then made centered just below with needle was incision taken the fascia hemostasis obtained cauterization fascia incised and subperiosteal dissection over the left side the spine over the left 4 and 5 spinous process lamina to the facets are became retractors placed markers placed in interspace another x-rays obtained confirming or positioning at L4-5. Microscope was brought in for microdissection this point in decompressive semi-hemilaminectomy medial facetectomy was started with high-speed drill completed with Kerrison punches. We medially range this large epidural mass with thickened hypertrophic ligament which we removed some of the soft tissue of the mass was sent to pathology for permanent section most likely this is synovial cyst and remove the removed off the dura  with and without with ease and laminotomy done over the L5 root and we used upper ankle joint curettes to scrape behind her exposure to make sure he did hold the cyst and decompression of the canal were finished we did decompression the canal and the 4 and 5 roots. It hemostasis with Gelfoam thrombin. The anterior thigh. About solution had very good hemostasis retractors removed fascia closed 0 Vicryl interrupted sutures of his tissue closed with 021 through Vicryl interrupted sutures skin closed benzoin Steri-Strips dressing was placed patient placed back in spine position woken (and transferred to recovery room.  PLAN OF CARE: Admit for overnight observation  PATIENT DISPOSITION:  PACU - hemodynamically stable.

## 2012-03-07 NOTE — H&P (Signed)
See H& P.

## 2012-03-07 NOTE — Anesthesia Postprocedure Evaluation (Signed)
  Anesthesia Post-op Note  Patient: Melissa Deleon  Procedure(s) Performed: Procedure(s) (LRB) with comments: LUMBAR LAMINECTOMY/DECOMPRESSION MICRODISCECTOMY 1 LEVEL (Left) - Left Lumbar four-five Laminectomy for Synovial cyst  Patient Location: PACU  Anesthesia Type:General  Level of Consciousness: awake, oriented, sedated and patient cooperative  Airway and Oxygen Therapy: Patient Spontanous Breathing  Post-op Pain: mild  Post-op Assessment: Post-op Vital signs reviewed, Patient's Cardiovascular Status Stable, Respiratory Function Stable, Patent Airway, No signs of Nausea or vomiting and Pain level controlled  Post-op Vital Signs: stable  Complications: No apparent anesthesia complications

## 2012-03-07 NOTE — Preoperative (Signed)
Beta Blockers   Reason not to administer Beta Blockers:Not Applicable 

## 2012-03-07 NOTE — Anesthesia Preprocedure Evaluation (Signed)
Anesthesia Evaluation  Patient identified by MRN, date of birth, ID band Patient awake    Reviewed: Allergy & Precautions, H&P , NPO status , Patient's Chart, lab work & pertinent test results  Airway Mallampati: I TM Distance: >3 FB Neck ROM: full    Dental   Pulmonary          Cardiovascular hypertension, Rhythm:regular Rate:Normal     Neuro/Psych    GI/Hepatic GERD-  ,  Endo/Other    Renal/GU      Musculoskeletal   Abdominal   Peds  Hematology   Anesthesia Other Findings   Reproductive/Obstetrics                           Anesthesia Physical Anesthesia Plan  ASA: I  Anesthesia Plan: General   Post-op Pain Management:    Induction: Intravenous  Airway Management Planned: Oral ETT  Additional Equipment:   Intra-op Plan:   Post-operative Plan:   Informed Consent: I have reviewed the patients History and Physical, chart, labs and discussed the procedure including the risks, benefits and alternatives for the proposed anesthesia with the patient or authorized representative who has indicated his/her understanding and acceptance.     Plan Discussed with: CRNA, Anesthesiologist and Surgeon  Anesthesia Plan Comments:         Anesthesia Quick Evaluation

## 2012-03-07 NOTE — Progress Notes (Signed)

## 2012-03-07 NOTE — Transfer of Care (Signed)
Immediate Anesthesia Transfer of Care Note  Patient: Melissa Deleon  Procedure(s) Performed: Procedure(s) (LRB) with comments: LUMBAR LAMINECTOMY/DECOMPRESSION MICRODISCECTOMY 1 LEVEL (Left) - Left Lumbar four-five Laminectomy for Synovial cyst  Patient Location: PACU  Anesthesia Type:General  Level of Consciousness: awake and alert   Airway & Oxygen Therapy: Patient Spontanous Breathing and Patient connected to nasal cannula oxygen  Post-op Assessment: Report given to PACU RN, Post -op Vital signs reviewed and stable and Patient moving all extremities X 4  Post vital signs: Reviewed and stable  Complications: No apparent anesthesia complications

## 2012-03-07 NOTE — Interval H&P Note (Signed)
History and Physical Interval Note:  03/07/2012 1:11 PM  Melissa Deleon  has presented today for surgery, with the diagnosis of Synovial cyst, Lumbar stenosis  The various methods of treatment have been discussed with the patient and family. After consideration of risks, benefits and other options for treatment, the patient has consented to  Procedure(s) (LRB) with comments: LUMBAR LAMINECTOMY/DECOMPRESSION MICRODISCECTOMY 1 LEVEL (Left) - Left L4-5 Laminectomy for Synovial cyst as a surgical intervention .  The patient's history has been reviewed, patient examined, no change in status, stable for surgery.  I have reviewed the patient's chart and labs.  Questions were answered to the patient's satisfaction.     Melissa Deleon R

## 2012-03-07 NOTE — Plan of Care (Signed)
Problem: Consults Goal: Diagnosis - Spinal Surgery Outcome: Completed/Met Date Met:  03/07/12 Lumbar Laminectomy (Complex)     

## 2012-03-07 NOTE — Anesthesia Procedure Notes (Signed)
Procedure Name: Intubation Date/Time: 03/07/2012 1:32 PM Performed by: Glo Herring BROWN Pre-anesthesia Checklist: Timeout performed, Patient identified, Emergency Drugs available, Suction available and Patient being monitored Patient Re-evaluated:Patient Re-evaluated prior to inductionOxygen Delivery Method: Circle system utilized Preoxygenation: Pre-oxygenation with 100% oxygen Intubation Type: IV induction Ventilation: Mask ventilation without difficulty Laryngoscope Size: Mac and 3 Grade View: Grade II Tube type: Oral Tube size: 7.5 mm Number of attempts: 1 Airway Equipment and Method: Stylet and LTA kit utilized Placement Confirmation: positive ETCO2,  ETT inserted through vocal cords under direct vision and breath sounds checked- equal and bilateral Secured at: 21 cm Tube secured with: Tape Dental Injury: Teeth and Oropharynx as per pre-operative assessment

## 2012-03-08 ENCOUNTER — Encounter (HOSPITAL_COMMUNITY): Payer: Self-pay | Admitting: Neurosurgery

## 2012-03-08 MED ORDER — CYCLOBENZAPRINE HCL 10 MG PO TABS: 10.0000 mg | ORAL_TABLET | Freq: Three times a day (TID) | ORAL | Status: DC | PRN

## 2012-03-08 MED ORDER — HYDROCODONE-ACETAMINOPHEN 5-325 MG PO TABS: 1.0000 | ORAL_TABLET | ORAL | Status: DC | PRN

## 2012-03-08 NOTE — Discharge Summary (Signed)
Physician Discharge Summary  Patient ID: Melissa Deleon MRN: [id number] DOB/AGE: 24-Aug-1933 78 y.o.  Admit date: 03/07/2012 Discharge date: 03/08/2012  Admission Diagnoses:Synovial cyst, Lumbar stenosis   Discharge Diagnoses: Synovial cyst, Lumbar stenosis  Active Problems:  * No active hospital problems. *    Discharged Condition: good  Hospital Course: pt admitted day of surgery - underwent procedure below - pt doing well - no leg pain  - ambulating, eating voiding  Consults: none  Significant Diagnostic Studies: none  Treatments: surgery: lumabar laminictomy for epidural mass (synovial cyst) - Left L4-5 , microdisection   Discharge Exam: Blood pressure 113/73, pulse 83, temperature 99.1 F (37.3 C), temperature source Oral, resp. rate 14, SpO2 93.00%. Wound:c/d/i  Disposition: home     Medication List     As of 03/08/2012  8:27 AM    TAKE these medications         alendronate 70 MG tablet   Commonly known as: FOSAMAX   Take 70 mg by mouth every 7 (seven) days. Take with a full glass of water on an empty stomach. Take on fridays      aspirin EC 81 MG tablet   Take 81 mg by mouth daily.      cyclobenzaprine 10 MG tablet   Commonly known as: FLEXERIL   Take 1 tablet (10 mg total) by mouth 3 (three) times daily as needed for muscle spasms.      HYDROcodone-acetaminophen 5-325 MG per tablet   Commonly known as: NORCO/VICODIN   Take 1-2 tablets by mouth every 4 (four) hours as needed.      lisinopril-hydrochlorothiazide 20-25 MG per tablet   Commonly known as: PRINZIDE,ZESTORETIC   Take 1 tablet by mouth daily.      oxybutynin 10 MG 24 hr tablet   Commonly known as: DITROPAN-XL   Take 10 mg by mouth at bedtime.         SignedClydene Fake, MD 03/08/2012, 8:27 AM

## 2012-03-08 NOTE — Progress Notes (Signed)
Utilization review completed. Marykatherine Sherwood, RN, BSN. 

## 2012-04-19 ENCOUNTER — Emergency Department (HOSPITAL_COMMUNITY)
Admission: EM | Admit: 2012-04-19 | Discharge: 2012-04-19 | Disposition: A | Discharge status: Home / Self Care | Payer: Medicare Other | Attending: Emergency Medicine | Admitting: Emergency Medicine

## 2012-04-19 ENCOUNTER — Encounter (HOSPITAL_COMMUNITY): Payer: Self-pay

## 2012-04-19 ENCOUNTER — Emergency Department (HOSPITAL_COMMUNITY): Payer: Medicare Other

## 2012-04-19 DIAGNOSIS — I10 Essential (primary) hypertension: Secondary | ICD-10-CM | POA: Insufficient documentation

## 2012-04-19 DIAGNOSIS — N390 Urinary tract infection, site not specified: Secondary | ICD-10-CM | POA: Insufficient documentation

## 2012-04-19 DIAGNOSIS — K219 Gastro-esophageal reflux disease without esophagitis: Secondary | ICD-10-CM | POA: Insufficient documentation

## 2012-04-19 DIAGNOSIS — Z7982 Long term (current) use of aspirin: Secondary | ICD-10-CM | POA: Insufficient documentation

## 2012-04-19 DIAGNOSIS — Z79899 Other long term (current) drug therapy: Secondary | ICD-10-CM | POA: Insufficient documentation

## 2012-04-19 LAB — CBC WITH DIFFERENTIAL/PLATELET
Basophils Absolute: 0.1 10*3/uL (ref 0.0–0.1)
Basophils Relative: 1 % (ref 0–1)
Eosinophils Absolute: 0.3 10*3/uL (ref 0.0–0.7)
Eosinophils Relative: 6 % — ABNORMAL HIGH (ref 0–5)
HCT: 41.4 % (ref 36.0–46.0)
Lymphocytes Relative: 26 % (ref 12–46)
MCH: 29.2 pg (ref 26.0–34.0)
MCHC: 32.6 g/dL (ref 30.0–36.0)
MCV: 89.6 fL (ref 78.0–100.0)
Monocytes Absolute: 0.5 10*3/uL (ref 0.1–1.0)
Platelets: 279 10*3/uL (ref 150–400)
RDW: 12.9 % (ref 11.5–15.5)
WBC: 5.8 10*3/uL (ref 4.0–10.5)

## 2012-04-19 LAB — COMPREHENSIVE METABOLIC PANEL
ALT: 14 U/L (ref 0–35)
Albumin: 4.2 g/dL (ref 3.5–5.2)
Alkaline Phosphatase: 91 U/L (ref 39–117)
BUN: 11 mg/dL (ref 6–23)
Chloride: 98 mEq/L (ref 96–112)
Potassium: 3.6 mEq/L (ref 3.5–5.1)
Sodium: 137 mEq/L (ref 135–145)
Total Bilirubin: 0.5 mg/dL (ref 0.3–1.2)
Total Protein: 7.9 g/dL (ref 6.0–8.3)

## 2012-04-19 LAB — URINE MICROSCOPIC-ADD ON

## 2012-04-19 LAB — LIPASE, BLOOD: Lipase: 39 U/L (ref 11–59)

## 2012-04-19 LAB — URINALYSIS, ROUTINE W REFLEX MICROSCOPIC
Bilirubin Urine: NEGATIVE
Hgb urine dipstick: NEGATIVE
Ketones, ur: NEGATIVE mg/dL
Nitrite: NEGATIVE
Specific Gravity, Urine: <1.005 — ABNORMAL LOW (ref 1.005–1.030)
Urobilinogen, UA: 0.2 mg/dL (ref 0.0–1.0)

## 2012-04-19 MED ORDER — IOHEXOL 300 MG/ML  SOLN
100.0000 mL | Freq: Once | INTRAMUSCULAR | Status: AC | PRN
  Administered 2012-04-19: 100 mL via INTRAVENOUS

## 2012-04-19 MED ORDER — DEXTROSE 5 % IV SOLN
1.0000 g | Freq: Once | INTRAVENOUS | Status: AC
  Administered 2012-04-19: 1 g via INTRAVENOUS
  Filled 2012-04-19: qty 10

## 2012-04-19 MED ORDER — OXYCODONE-ACETAMINOPHEN 5-325 MG PO TABS
1.0000 | ORAL_TABLET | Freq: Once | ORAL | Status: AC
  Administered 2012-04-19: 1 via ORAL
  Filled 2012-04-19: qty 1

## 2012-04-19 MED ORDER — SODIUM CHLORIDE 0.9 % IV SOLN
INTRAVENOUS | Status: DC
  Administered 2012-04-19: 500 mL via INTRAVENOUS

## 2012-04-19 MED ORDER — OXYCODONE-ACETAMINOPHEN 5-325 MG PO TABS: 1.0000 | ORAL_TABLET | ORAL | Status: DC | PRN

## 2012-04-19 MED ORDER — SODIUM CHLORIDE 0.9 % IV BOLUS (SEPSIS)
500.0000 mL | Freq: Once | INTRAVENOUS | Status: AC
  Administered 2012-04-19: 500 mL via INTRAVENOUS

## 2012-04-19 NOTE — ED Provider Notes (Signed)
79 y.o. Female seen in her pmd office yesterday and diagnosed with uti.  She continues to have suprapubic pain after two doses of antibiotics yesterday and presented earlier this a.m. With initial assessment by Dr. Adriana Simas.  He obtained labs and ct scan. Results for orders placed during the hospital encounter of 04/19/12  COMPREHENSIVE METABOLIC PANEL      Component Value Range   Sodium 137  135 - 145 mEq/L   Potassium 3.6  3.5 - 5.1 mEq/L   Chloride 98  96 - 112 mEq/L   CO2 31  19 - 32 mEq/L   Glucose, Bld 114 (*) 70 - 99 mg/dL   BUN 11  6 - 23 mg/dL   Creatinine, Ser 4.09  0.50 - 1.10 mg/dL   Calcium 81.1  8.4 - 91.4 mg/dL   Total Protein 7.9  6.0 - 8.3 g/dL   Albumin 4.2  3.5 - 5.2 g/dL   AST 20  0 - 37 U/L   ALT 14  0 - 35 U/L   Alkaline Phosphatase 91  39 - 117 U/L   Total Bilirubin 0.5  0.3 - 1.2 mg/dL   GFR calc non Af Amer 68 (*) >90 mL/min   GFR calc Af Amer 79 (*) >90 mL/min  CBC WITH DIFFERENTIAL      Component Value Range   WBC 5.8  4.0 - 10.5 K/uL   RBC 4.62  3.87 - 5.11 MIL/uL   Hemoglobin 13.5  12.0 - 15.0 g/dL   HCT 78.2  95.6 - 21.3 %   MCV 89.6  78.0 - 100.0 fL   MCH 29.2  26.0 - 34.0 pg   MCHC 32.6  30.0 - 36.0 g/dL   RDW 08.6  57.8 - 46.9 %   Platelets 279  150 - 400 K/uL   Neutrophils Relative 58  43 - 77 %   Neutro Abs 3.3  1.7 - 7.7 K/uL   Lymphocytes Relative 26  12 - 46 %   Lymphs Abs 1.5  0.7 - 4.0 K/uL   Monocytes Relative 9  3 - 12 %   Monocytes Absolute 0.5  0.1 - 1.0 K/uL   Eosinophils Relative 6 (*) 0 - 5 %   Eosinophils Absolute 0.3  0.0 - 0.7 K/uL   Basophils Relative 1  0 - 1 %   Basophils Absolute 0.1  0.0 - 0.1 K/uL  LIPASE, BLOOD      Component Value Range   Lipase 39  11 - 59 U/L  URINALYSIS, ROUTINE W REFLEX MICROSCOPIC      Component Value Range   Color, Urine YELLOW  YELLOW   APPearance CLEAR  CLEAR   Specific Gravity, Urine <1.005 (*) 1.005 - 1.030   pH 6.5  5.0 - 8.0   Glucose, UA NEGATIVE  NEGATIVE mg/dL   Hgb urine dipstick  NEGATIVE  NEGATIVE   Bilirubin Urine NEGATIVE  NEGATIVE   Ketones, ur NEGATIVE  NEGATIVE mg/dL   Protein, ur NEGATIVE  NEGATIVE mg/dL   Urobilinogen, UA 0.2  0.0 - 1.0 mg/dL   Nitrite NEGATIVE  NEGATIVE   Leukocytes, UA MODERATE (*) NEGATIVE  URINE MICROSCOPIC-ADD ON      Component Value Range   Squamous Epithelial / LPF FEW (*) RARE   WBC, UA TOO NUMEROUS TO COUNT  <3 WBC/hpf   Bacteria, UA FEW (*) RARE   Ct Abdomen Pelvis W Contrast  04/19/2012  *RADIOLOGY REPORT*  Clinical Data: Right lower quadrant abdominal pain for 3  days, history of bladder ring  CT ABDOMEN AND PELVIS WITH CONTRAST  Technique:  Multidetector CT imaging of the abdomen and pelvis was performed following the standard protocol during bolus administration of intravenous contrast.  Contrast: OMNIPAQUE IOHEXOL 300 MG/ML  SOLN  Comparison: Abdominal ultrasound - 01/26/2009  Findings:  Normal hepatic contour.  No discrete hepatic lesions.  Gallstones is seen with an otherwise normal-appearing gallbladder.  No definite evidence of gallbladder wall thickening or pericholecystic fluid.  No definite intra or extrahepatic biliary ductal dilatation.  No ascites.  There is symmetric enhancement and excretion of the bilateral kidneys.  Note is made of a partially exophytic approximately 4.5 x 4.2 cm hypoattenuating (5 HU) exophytic cyst arising from the posterior superior aspect of the left kidney (image 31, series 2). Additional sub centimeter bilateral hypoattenuating lesions too small to accurately characterize so favored to represent additional renal cysts.  No definite renal stones on the post contrast examination.  No urinary obstruction or perinephric stranding. There is mild thickening of the crux of the left adrenal gland without discrete nodule.  Normal appearance of the right adrenal gland, pancreas and spleen.  Large hiatal hernia. Ingested enteric contrast extends to the level of the cecum.  No evidence of enteric obstruction.   Scattered colonic diverticulosis without evidence of diverticulitis.  Normal appearance of the appendix (best seen on coronal images 26 through 37, series four).  No pneumoperitoneum, pneumatosis or portal venous gas.  Moderate amount of primarily calcified atherosclerotic plaque within normal caliber abdominal aorta.  There is a moderate amount of eccentric calcified plaque involving the origin of the left renal artery.  There is scattered atherosclerotic plaque involving the main trunk of the SMA.  The major branch vessels of the abdominal aorta, including the IMA, appear patent on this non CTA examination.  No definite retroperitoneal, mesenteric, pelvic or inguinal lymphadenopathy.  Vaginal pessary device. Otherwise, normal appearance of the pelvic organs.  No free fluid is seen within the pelvis.  Limited visualization of the lower thorax demonstrates minimal bibasilar dependent atelectasis.  No focal airspace opacities.  No pleural effusion.  Borderline cardiomegaly.  Calcifications in the mitral valve annulus.  No pericardial effusion.  Multilevel mild to moderate thoracolumbar spine degenerative change.  IMPRESSION:  1.  No explanation for patient's right lower quadrant abdominal pain.  Specifically, no evidence of enteric or urinary obstruction. Normal appearance of the appendix. 2.  Cholelithiasis without evidence of cholecystitis.  3.  Colonic diverticulosis without evidence of diverticulitis. 4.  Large hiatal hernia.  5.  Left-sided renal cyst.  6.  Moderate amount of atherosclerotic plaque within a normal caliber abdominal aorta.  7.  Vaginal pessary device.   Original Report Authenticated By: Tacey Ruiz, MD    Patient with uti but no evidence of acute intraabdominal abnormality on ct.  Patient afebrile, no leukocytosis and taking po.  Patient will be given short course of pain medicine.  Urine to be cultured and antibiotics continued.  She received rocephin iv here per Dr. Adriana Simas.   Hilario Quarry,  MD 04/19/12 [phone]

## 2012-04-19 NOTE — ED Notes (Signed)
Pt with c/o abd pain and back pain s/p back surgery 6 weeks ago.  Nausea, no vomiting or diarrhea

## 2012-04-19 NOTE — ED Notes (Signed)
Patient finished with oral contrast. CT made aware.

## 2012-04-19 NOTE — ED Provider Notes (Signed)
History     CSN: [id number]  Arrival date & time 04/19/12  4782   First MD Initiated Contact with Patient 04/19/12 [phone]      Chief Complaint  Patient presents with  . Abdominal Pain  . Back Pain    (Consider location/radiation/quality/duration/timing/severity/associated sxs/prior treatment) HPI... right lower quadrant abdominal pain since Wednesday with radiation to the right groin and right suprapubic area. Normal urination and bowel movements. No fever, sweats, chills, diarrhea. Does complain of nausea. Recent back surgery approximately 6 weeks ago.  Past Medical History  Diagnosis Date  . Hypertension   . GERD (gastroesophageal reflux disease)     Past Surgical History  Procedure Date  . No past surgeries     RING PLACED TO HOLD BLADDER   . Lumbar laminectomy/decompression microdiscectomy 03/07/2012    Procedure: LUMBAR LAMINECTOMY/DECOMPRESSION MICRODISCECTOMY 1 LEVEL;  Surgeon: Clydene Fake, MD;  Location: [facility];  Service: Neurosurgery;  Laterality: Left;  Left Lumbar four-five Laminectomy for Synovial cyst    No family history on file.  History  Substance Use Topics  . Smoking status: Never Smoker   . Smokeless tobacco: Not on file  . Alcohol Use: No    OB History    Grav Para Term Preterm Abortions TAB SAB Ect Mult Living                  Review of Systems  All other systems reviewed and are negative.    Allergies  Sulfa antibiotics  Home Medications   Current Outpatient Rx  Name  Route  Sig  Dispense  Refill  . ALENDRONATE SODIUM 70 MG PO TABS   Oral   Take 70 mg by mouth every 7 (seven) days. Take with a full glass of water on an empty stomach. Take on fridays         . ASPIRIN EC 81 MG PO TBEC   Oral   Take 81 mg by mouth daily.         Marland Kitchen LISINOPRIL-HYDROCHLOROTHIAZIDE 20-25 MG PO TABS   Oral   Take 1 tablet by mouth daily.         . OXYBUTYNIN CHLORIDE ER 10 MG PO TB24   Oral   Take 10 mg by mouth at bedtime.         . CYCLOBENZAPRINE HCL 10 MG PO TABS   Oral   Take 1 tablet (10 mg total) by mouth 3 (three) times daily as needed for muscle spasms.   4 tablet   1   . HYDROCODONE-ACETAMINOPHEN 5-325 MG PO TABS   Oral   Take 1-2 tablets by mouth every 4 (four) hours as needed.   41 tablet   0     BP 143/74  Pulse 65  Temp 98.1 F (36.7 C) (Oral)  Resp 18  Ht 5\' 2"  (1.575 m)  Wt 165 lb (74.844 kg)  BMI 30.18 kg/m2  SpO2 99%  Physical Exam  Nursing note and vitals reviewed. Constitutional: She is oriented to person, place, and time. She appears well-developed and well-nourished.  HENT:  Head: Normocephalic and atraumatic.  Eyes: Conjunctivae normal and EOM are normal. Pupils are equal, round, and reactive to light.  Neck: Normal range of motion. Neck supple.  Cardiovascular: Normal rate, regular rhythm and normal heart sounds.   Pulmonary/Chest: Effort normal and breath sounds normal.  Abdominal: Soft. Bowel sounds are normal.       Minimal tenderness right lower quadrant, right inguinal area, right suprapubic  area  Musculoskeletal: Normal range of motion.  Neurological: She is alert and oriented to person, place, and time.  Skin: Skin is warm and dry.  Psychiatric: She has a normal mood and affect.    ED Course  Procedures (including critical care time)   Labs Reviewed  COMPREHENSIVE METABOLIC PANEL  CBC WITH DIFFERENTIAL  LIPASE, BLOOD  URINALYSIS, ROUTINE W REFLEX MICROSCOPIC   No results found.   No diagnosis found.    MDM  Patient does not request pain medication. Will obtain labs, urinalysis, CT scan of abdomen pelvis secondary to age. Discussed with Dr Rosalia Hammers        Donnetta Hutching, MD 04/19/12 0630

## 2012-04-22 LAB — URINE CULTURE

## 2012-04-23 NOTE — ED Notes (Signed)
+   urine Patient treated with Rocephin-sensitive to same-Chart appended per protocol MD.

## 2012-05-27 ENCOUNTER — Other Ambulatory Visit (HOSPITAL_COMMUNITY): Payer: Self-pay | Admitting: Neurosurgery

## 2012-05-27 DIAGNOSIS — M8448XA Pathological fracture, other site, initial encounter for fracture: Secondary | ICD-10-CM

## 2012-05-30 ENCOUNTER — Other Ambulatory Visit (HOSPITAL_COMMUNITY): Payer: Medicare Other

## 2012-06-03 ENCOUNTER — Ambulatory Visit (HOSPITAL_COMMUNITY)
Admission: RE | Admit: 2012-06-03 | Discharge: 2012-06-03 | Disposition: A | Discharge status: Home / Self Care | Payer: Medicare Other | Source: Ambulatory Visit | Attending: Neurosurgery | Admitting: Neurosurgery

## 2012-06-03 DIAGNOSIS — M5126 Other intervertebral disc displacement, lumbar region: Secondary | ICD-10-CM | POA: Insufficient documentation

## 2012-06-03 DIAGNOSIS — M5137 Other intervertebral disc degeneration, lumbosacral region: Secondary | ICD-10-CM | POA: Insufficient documentation

## 2012-06-03 DIAGNOSIS — M79609 Pain in unspecified limb: Secondary | ICD-10-CM | POA: Insufficient documentation

## 2012-06-03 DIAGNOSIS — M51379 Other intervertebral disc degeneration, lumbosacral region without mention of lumbar back pain or lower extremity pain: Secondary | ICD-10-CM | POA: Insufficient documentation

## 2012-06-03 DIAGNOSIS — M545 Low back pain, unspecified: Secondary | ICD-10-CM | POA: Insufficient documentation

## 2012-06-03 DIAGNOSIS — M8448XA Pathological fracture, other site, initial encounter for fracture: Secondary | ICD-10-CM

## 2012-06-07 ENCOUNTER — Other Ambulatory Visit: Payer: Self-pay | Admitting: Neurosurgery

## 2012-06-07 ENCOUNTER — Encounter (HOSPITAL_COMMUNITY): Payer: Self-pay | Admitting: Pharmacy Technician

## 2012-06-10 ENCOUNTER — Encounter (HOSPITAL_COMMUNITY): Payer: Self-pay | Admitting: *Deleted

## 2012-06-10 MED ORDER — CEFAZOLIN SODIUM-DEXTROSE 2-3 GM-% IV SOLR
2.0000 g | INTRAVENOUS | Status: AC
  Administered 2012-06-11: 2 g via INTRAVENOUS
  Filled 2012-06-10: qty 50

## 2012-06-11 ENCOUNTER — Encounter (HOSPITAL_COMMUNITY): Payer: Self-pay | Admitting: Certified Registered"

## 2012-06-11 ENCOUNTER — Encounter (HOSPITAL_COMMUNITY)
Admission: RE | Disposition: A | Discharge status: Home / Self Care | Payer: Self-pay | Source: Ambulatory Visit | Attending: Neurosurgery

## 2012-06-11 ENCOUNTER — Observation Stay (HOSPITAL_COMMUNITY)
Admission: RE | Admit: 2012-06-11 | Discharge: 2012-06-12 | Disposition: A | Discharge status: Home / Self Care | Payer: Medicare Other | Source: Ambulatory Visit | Attending: Neurosurgery | Admitting: Neurosurgery

## 2012-06-11 ENCOUNTER — Ambulatory Visit (HOSPITAL_COMMUNITY): Payer: Medicare Other | Admitting: Certified Registered"

## 2012-06-11 ENCOUNTER — Ambulatory Visit (HOSPITAL_COMMUNITY): Payer: Medicare Other

## 2012-06-11 ENCOUNTER — Encounter (HOSPITAL_COMMUNITY): Payer: Self-pay | Admitting: Anesthesiology

## 2012-06-11 DIAGNOSIS — I10 Essential (primary) hypertension: Secondary | ICD-10-CM | POA: Insufficient documentation

## 2012-06-11 DIAGNOSIS — M8448XA Pathological fracture, other site, initial encounter for fracture: Principal | ICD-10-CM | POA: Insufficient documentation

## 2012-06-11 HISTORY — PX: KYPHOPLASTY: SHX5884

## 2012-06-11 LAB — BASIC METABOLIC PANEL
CO2: 30 mEq/L (ref 19–32)
Calcium: 10.4 mg/dL (ref 8.4–10.5)
Creatinine, Ser: 0.74 mg/dL (ref 0.50–1.10)
GFR calc non Af Amer: 81 mL/min — ABNORMAL LOW (ref >90)

## 2012-06-11 LAB — CBC WITH DIFFERENTIAL/PLATELET
Basophils Absolute: 0 10*3/uL (ref 0.0–0.1)
Eosinophils Absolute: 0.4 10*3/uL (ref 0.0–0.7)
Eosinophils Relative: 6 % — ABNORMAL HIGH (ref 0–5)
Lymphocytes Relative: 38 % (ref 12–46)
MCH: 29.2 pg (ref 26.0–34.0)
MCV: 85.7 fL (ref 78.0–100.0)
Neutrophils Relative %: 46 % (ref 43–77)
Platelets: 295 10*3/uL (ref 150–400)
RDW: 12.5 % (ref 11.5–15.5)
WBC: 5.8 10*3/uL (ref 4.0–10.5)

## 2012-06-11 LAB — PROTIME-INR: Prothrombin Time: 12.6 seconds (ref 11.6–15.2)

## 2012-06-11 LAB — APTT: aPTT: 27 seconds (ref 24–37)

## 2012-06-11 SURGERY — KYPHOPLASTY
Anesthesia: General

## 2012-06-11 MED ORDER — 0.9 % SODIUM CHLORIDE (POUR BTL) OPTIME
TOPICAL | Status: DC | PRN
  Administered 2012-06-11: 1000 mL

## 2012-06-11 MED ORDER — ONDANSETRON HCL 4 MG/2ML IJ SOLN
INTRAMUSCULAR | Status: DC | PRN
  Administered 2012-06-11: 4 mg via INTRAVENOUS

## 2012-06-11 MED ORDER — LIDOCAINE HCL (CARDIAC) 20 MG/ML IV SOLN
INTRAVENOUS | Status: DC | PRN
  Administered 2012-06-11: 30 mg via INTRAVENOUS

## 2012-06-11 MED ORDER — PROPOFOL 10 MG/ML IV BOLUS
INTRAVENOUS | Status: DC | PRN
  Administered 2012-06-11: 50 mg via INTRAVENOUS
  Administered 2012-06-11: 150 mg via INTRAVENOUS

## 2012-06-11 MED ORDER — SODIUM CHLORIDE 0.9 % IV SOLN
INTRAVENOUS | Status: DC
  Administered 2012-06-11: 23:00:00 via INTRAVENOUS

## 2012-06-11 MED ORDER — HYDROCODONE-ACETAMINOPHEN 5-325 MG PO TABS: 1.0000 | ORAL_TABLET | ORAL | Status: DC | PRN

## 2012-06-11 MED ORDER — KETOROLAC TROMETHAMINE 30 MG/ML IJ SOLN
INTRAMUSCULAR | Status: AC
  Filled 2012-06-11: qty 1

## 2012-06-11 MED ORDER — HYDROCHLOROTHIAZIDE 25 MG PO TABS
25.0000 mg | ORAL_TABLET | Freq: Every day | ORAL | Status: DC
  Filled 2012-06-11: qty 1

## 2012-06-11 MED ORDER — LACTATED RINGERS IV SOLN
INTRAVENOUS | Status: DC | PRN
  Administered 2012-06-11 (×2): via INTRAVENOUS

## 2012-06-11 MED ORDER — ALENDRONATE SODIUM 70 MG PO TABS: 70.0000 mg | ORAL_TABLET | ORAL | Status: DC

## 2012-06-11 MED ORDER — SODIUM CHLORIDE 0.9 % IJ SOLN
3.0000 mL | Freq: Two times a day (BID) | INTRAMUSCULAR | Status: DC
  Administered 2012-06-11: 3 mL via INTRAVENOUS

## 2012-06-11 MED ORDER — DOCUSATE SODIUM 100 MG PO CAPS
100.0000 mg | ORAL_CAPSULE | Freq: Two times a day (BID) | ORAL | Status: DC
  Administered 2012-06-11: 100 mg via ORAL
  Filled 2012-06-11: qty 1

## 2012-06-11 MED ORDER — ACETAMINOPHEN 325 MG PO TABS: 650.0000 mg | ORAL_TABLET | ORAL | Status: DC | PRN

## 2012-06-11 MED ORDER — ACETAMINOPHEN 650 MG RE SUPP: 650.0000 mg | RECTAL | Status: DC | PRN

## 2012-06-11 MED ORDER — LACTATED RINGERS IV SOLN: INTRAVENOUS | Status: DC

## 2012-06-11 MED ORDER — OXYCODONE-ACETAMINOPHEN 5-325 MG PO TABS: 1.0000 | ORAL_TABLET | ORAL | Status: DC | PRN

## 2012-06-11 MED ORDER — LIDOCAINE-EPINEPHRINE 1 %-1:100000 IJ SOLN
INTRAMUSCULAR | Status: DC | PRN
  Administered 2012-06-11: 17 mL

## 2012-06-11 MED ORDER — ROCURONIUM BROMIDE 100 MG/10ML IV SOLN
INTRAVENOUS | Status: DC | PRN
  Administered 2012-06-11: 20 mg via INTRAVENOUS

## 2012-06-11 MED ORDER — CYCLOBENZAPRINE HCL 10 MG PO TABS: 10.0000 mg | ORAL_TABLET | Freq: Three times a day (TID) | ORAL | Status: DC | PRN

## 2012-06-11 MED ORDER — SODIUM CHLORIDE 0.9 % IV SOLN: 250.0000 mL | INTRAVENOUS | Status: DC

## 2012-06-11 MED ORDER — SODIUM CHLORIDE 0.9 % IJ SOLN: 3.0000 mL | INTRAMUSCULAR | Status: DC | PRN

## 2012-06-11 MED ORDER — ONDANSETRON HCL 4 MG/2ML IJ SOLN: 4.0000 mg | INTRAMUSCULAR | Status: DC | PRN

## 2012-06-11 MED ORDER — IOHEXOL 300 MG/ML  SOLN
INTRAMUSCULAR | Status: DC | PRN
  Administered 2012-06-11: 50 mL via INTRAVENOUS

## 2012-06-11 MED ORDER — GLYCOPYRROLATE 0.2 MG/ML IJ SOLN
INTRAMUSCULAR | Status: DC | PRN
  Administered 2012-06-11: 0.4 mg via INTRAVENOUS

## 2012-06-11 MED ORDER — LISINOPRIL-HYDROCHLOROTHIAZIDE 20-25 MG PO TABS: 1.0000 | ORAL_TABLET | Freq: Every day | ORAL | Status: DC

## 2012-06-11 MED ORDER — MAGNESIUM HYDROXIDE 400 MG/5ML PO SUSP: 30.0000 mL | Freq: Every day | ORAL | Status: DC | PRN

## 2012-06-11 MED ORDER — OXYBUTYNIN CHLORIDE ER 10 MG PO TB24
10.0000 mg | ORAL_TABLET | Freq: Every day | ORAL | Status: DC
  Administered 2012-06-11: 10 mg via ORAL
  Filled 2012-06-11 (×2): qty 1

## 2012-06-11 MED ORDER — MORPHINE SULFATE 2 MG/ML IJ SOLN: 1.0000 mg | INTRAMUSCULAR | Status: DC | PRN

## 2012-06-11 MED ORDER — KETOROLAC TROMETHAMINE 30 MG/ML IJ SOLN
30.0000 mg | Freq: Four times a day (QID) | INTRAMUSCULAR | Status: DC
  Administered 2012-06-11 – 2012-06-12 (×3): 30 mg via INTRAVENOUS
  Filled 2012-06-11 (×6): qty 1

## 2012-06-11 MED ORDER — CEFAZOLIN SODIUM 1-5 GM-% IV SOLN
1.0000 g | Freq: Three times a day (TID) | INTRAVENOUS | Status: AC
  Administered 2012-06-11 – 2012-06-12 (×2): 1 g via INTRAVENOUS
  Filled 2012-06-11 (×2): qty 50

## 2012-06-11 MED ORDER — MUPIROCIN 2 % EX OINT
TOPICAL_OINTMENT | Freq: Two times a day (BID) | CUTANEOUS | Status: DC
  Administered 2012-06-11: 1 via NASAL
  Filled 2012-06-11: qty 22

## 2012-06-11 MED ORDER — FENTANYL CITRATE 0.05 MG/ML IJ SOLN
INTRAMUSCULAR | Status: DC | PRN
  Administered 2012-06-11 (×2): 50 ug via INTRAVENOUS

## 2012-06-11 MED ORDER — MUPIROCIN 2 % EX OINT
TOPICAL_OINTMENT | CUTANEOUS | Status: AC
  Filled 2012-06-11: qty 22

## 2012-06-11 MED ORDER — ONDANSETRON HCL 4 MG/2ML IJ SOLN: 4.0000 mg | Freq: Once | INTRAMUSCULAR | Status: DC | PRN

## 2012-06-11 MED ORDER — LISINOPRIL 20 MG PO TABS
20.0000 mg | ORAL_TABLET | Freq: Every day | ORAL | Status: DC
  Filled 2012-06-11: qty 1

## 2012-06-11 MED ORDER — HYDROMORPHONE HCL PF 1 MG/ML IJ SOLN: 0.2500 mg | INTRAMUSCULAR | Status: DC | PRN

## 2012-06-11 MED ORDER — NEOSTIGMINE METHYLSULFATE 1 MG/ML IJ SOLN
INTRAMUSCULAR | Status: DC | PRN
  Administered 2012-06-11: 3 mg via INTRAVENOUS

## 2012-06-11 MED ORDER — ARTIFICIAL TEARS OP OINT
TOPICAL_OINTMENT | OPHTHALMIC | Status: DC | PRN
  Administered 2012-06-11: 1 via OPHTHALMIC

## 2012-06-11 MED ORDER — LIDOCAINE HCL 4 % MT SOLN
OROMUCOSAL | Status: DC | PRN
  Administered 2012-06-11: 4 mL via TOPICAL

## 2012-06-11 SURGICAL SUPPLY — 36 items
ADH SKN CLS APL DERMABOND .7 (GAUZE/BANDAGES/DRESSINGS) ×1
BANDAGE ADHESIVE 1X3 (GAUZE/BANDAGES/DRESSINGS) ×2 IMPLANT
BLADE SURG 15 STRL LF DISP TIS (BLADE) ×1 IMPLANT
BLADE SURG 15 STRL SS (BLADE) ×2
BLADE SURG ROTATE 9660 (MISCELLANEOUS) IMPLANT
CEMENT KYPHON C01A KIT/MIXER (Cement) ×2 IMPLANT
CLOTH BEACON ORANGE TIMEOUT ST (SAFETY) ×2 IMPLANT
CONT SPEC 4OZ CLIKSEAL STRL BL (MISCELLANEOUS) ×4 IMPLANT
DERMABOND ADVANCED (GAUZE/BANDAGES/DRESSINGS) ×1
DERMABOND ADVANCED .7 DNX12 (GAUZE/BANDAGES/DRESSINGS) IMPLANT
DEVICE BIOPSY BONE KYPHX (INSTRUMENTS) ×1 IMPLANT
DRAPE C-ARM 42X72 X-RAY (DRAPES) ×3 IMPLANT
DRAPE LAPAROTOMY 100X72X124 (DRAPES) ×2 IMPLANT
DRAPE PROXIMA HALF (DRAPES) IMPLANT
DURAPREP 26ML APPLICATOR (WOUND CARE) ×2 IMPLANT
GAUZE SPONGE 4X4 16PLY XRAY LF (GAUZE/BANDAGES/DRESSINGS) ×2 IMPLANT
GLOVE ECLIPSE 7.5 STRL STRAW (GLOVE) ×2 IMPLANT
GLOVE EXAM NITRILE LRG STRL (GLOVE) ×1 IMPLANT
GLOVE EXAM NITRILE MD LF STRL (GLOVE) IMPLANT
GLOVE EXAM NITRILE XL STR (GLOVE) IMPLANT
GLOVE EXAM NITRILE XS STR PU (GLOVE) IMPLANT
GOWN BRE IMP SLV AUR LG STRL (GOWN DISPOSABLE) ×2 IMPLANT
GOWN STRL REIN 2XL LVL4 (GOWN DISPOSABLE) IMPLANT
KIT BASIN OR (CUSTOM PROCEDURE TRAY) ×2 IMPLANT
KIT ROOM TURNOVER OR (KITS) ×2 IMPLANT
NDL HYPO 25X1 1.5 SAFETY (NEEDLE) ×1 IMPLANT
NEEDLE HYPO 25X1 1.5 SAFETY (NEEDLE) ×2 IMPLANT
NS IRRIG 1000ML POUR BTL (IV SOLUTION) ×2 IMPLANT
PACK SURGICAL SETUP 50X90 (CUSTOM PROCEDURE TRAY) ×2 IMPLANT
PAD ARMBOARD 7.5X6 YLW CONV (MISCELLANEOUS) ×6 IMPLANT
SUT VIC AB 3-0 SH 8-18 (SUTURE) ×2 IMPLANT
SYR CONTROL 10ML LL (SYRINGE) ×2 IMPLANT
TOWEL OR 17X24 6PK STRL BLUE (TOWEL DISPOSABLE) ×2 IMPLANT
TOWEL OR 17X26 10 PK STRL BLUE (TOWEL DISPOSABLE) ×2 IMPLANT
TRAY KYPHOPAK 20/3 ONESTEP 1ST (MISCELLANEOUS) ×1 IMPLANT
WATER STERILE IRR 1000ML POUR (IV SOLUTION) ×2 IMPLANT

## 2012-06-11 NOTE — Discharge Summary (Addendum)
Physician Discharge Summary  Patient ID: Melissa Deleon MRN: [id number] DOB/AGE: 1933/10/08 79 y.o.  Admit date: 06/11/2012 Discharge date: 06/11/2012  Admission Diagnoses:L2 compression fracture   Discharge Diagnoses: L2 compression fracture  Active Problems:   * No active hospital problems. *   Discharged Condition: good  Hospital Course: pt admitted day of surgery - underwent procedure below - pt doing well - ambulating -    Consults: None  Significant Diagnostic Studies: none  Treatments: Balloon KYPHOPLASTY L2 With mythylmethacrylate, Bone biopsy , (C-Arm x 2) ,    Discharge Exam: Blood pressure 139/83, pulse 76, temperature 98.1 F (36.7 C), temperature source Oral, resp. rate 18, SpO2 98.00%. Wound:c/d/i  Disposition: home     Medication List    TAKE these medications       alendronate 70 MG tablet  Commonly known as:  FOSAMAX  Take 70 mg by mouth every 7 (seven) days. Take with a full glass of water on an empty stomach. Take on fridays     ALEVE 220 MG tablet  Generic drug:  naproxen sodium  Take 220 mg by mouth 2 (two) times daily with a meal.     aspirin EC 81 MG tablet  Take 81 mg by mouth daily.     cyclobenzaprine 10 MG tablet  Commonly known as:  FLEXERIL  Take 1 tablet (10 mg total) by mouth 3 (three) times daily as needed for muscle spasms.     HYDROcodone-acetaminophen 5-325 MG per tablet  Commonly known as:  NORCO/VICODIN  Take 1-2 tablets by mouth every 4 (four) hours as needed.     lisinopril-hydrochlorothiazide 20-25 MG per tablet  Commonly known as:  PRINZIDE,ZESTORETIC  Take 1 tablet by mouth daily.     oxybutynin 10 MG 24 hr tablet  Commonly known as:  DITROPAN-XL  Take 10 mg by mouth at bedtime.       pt with no back or hip pain - incisions c/d/i - d/c home  Signed: Clydene Fake, MD 06/11/2012, 3:15 PM

## 2012-06-11 NOTE — Anesthesia Postprocedure Evaluation (Signed)
  Anesthesia Post-op Note  Patient: Melissa Deleon  Procedure(s) Performed: Procedure(s) with comments: KYPHOPLASTY (C-Arm x 2) (N/A) - Lumbar two Kyphoplasty  Patient Location: PACU  Anesthesia Type:General  Level of Consciousness: awake, oriented and patient cooperative  Airway and Oxygen Therapy: Patient Spontanous Breathing  Post-op Pain: mild  Post-op Assessment: Post-op Vital signs reviewed, Patient's Cardiovascular Status Stable, Respiratory Function Stable, Patent Airway, No signs of Nausea or vomiting and Pain level controlled  Post-op Vital Signs: stable  Complications: No apparent anesthesia complications

## 2012-06-11 NOTE — Anesthesia Procedure Notes (Signed)
Procedure Name: Intubation Date/Time: 06/11/2012 1:58 PM Performed by: Luster Landsberg Pre-anesthesia Checklist: Patient identified, Emergency Drugs available, Suction available and Patient being monitored Patient Re-evaluated:Patient Re-evaluated prior to inductionOxygen Delivery Method: Circle system utilized Preoxygenation: Pre-oxygenation with 100% oxygen Intubation Type: IV induction Ventilation: Mask ventilation without difficulty Laryngoscope Size: Mac and 3 Grade View: Grade I Tube type: Oral Tube size: 7.0 mm Number of attempts: 1 Airway Equipment and Method: Stylet and LTA kit utilized Placement Confirmation: ETT inserted through vocal cords under direct vision,  positive ETCO2 and breath sounds checked- equal and bilateral Secured at: 21 cm Tube secured with: Tape Dental Injury: Teeth and Oropharynx as per pre-operative assessment

## 2012-06-11 NOTE — Anesthesia Preprocedure Evaluation (Signed)
Anesthesia Evaluation  Patient identified by MRN, date of birth, ID band Patient awake    Reviewed: Allergy & Precautions, H&P , NPO status , Patient's Chart, lab work & pertinent test results  Airway Mallampati: I TM Distance: >3 FB Neck ROM: full    Dental   Pulmonary          Cardiovascular hypertension, Rhythm:regular Rate:Normal     Neuro/Psych    GI/Hepatic GERD-  ,  Endo/Other    Renal/GU      Musculoskeletal   Abdominal   Peds  Hematology   Anesthesia Other Findings   Reproductive/Obstetrics                           Anesthesia Physical Anesthesia Plan  ASA: II  Anesthesia Plan: General   Post-op Pain Management:    Induction: Intravenous  Airway Management Planned: Oral ETT  Additional Equipment:   Intra-op Plan:   Post-operative Plan: Extubation in OR  Informed Consent: I have reviewed the patients History and Physical, chart, labs and discussed the procedure including the risks, benefits and alternatives for the proposed anesthesia with the patient or authorized representative who has indicated his/her understanding and acceptance.     Plan Discussed with: CRNA, Anesthesiologist and Surgeon  Anesthesia Plan Comments:         Anesthesia Quick Evaluation  

## 2012-06-11 NOTE — Transfer of Care (Signed)
Immediate Anesthesia Transfer of Care Note  Patient: Melissa Deleon  Procedure(s) Performed: Procedure(s) with comments: KYPHOPLASTY (C-Arm x 2) (N/A) - Lumbar two Kyphoplasty  Patient Location: PACU  Anesthesia Type:General  Level of Consciousness: awake, alert  and oriented  Airway & Oxygen Therapy: Patient Spontanous Breathing and Patient connected to nasal cannula oxygen  Post-op Assessment: Report given to PACU RN, Post -op Vital signs reviewed and stable and Patient moving all extremities  Post vital signs: Reviewed and stable  Complications: No apparent anesthesia complications

## 2012-06-11 NOTE — Progress Notes (Signed)
Pts family states they thought she was coming home tomorrow and they are unprepared to take her home tonight.  They state they have no one to stay with her and they want her to spend the night.  Dr. Phoebe Perch notified.  Orders received to admit pt 23hr obs to 3500. Admitting notified for bed.

## 2012-06-11 NOTE — Progress Notes (Signed)
Report called to pt's nurse.  Pt transported to 3533 without incident.

## 2012-06-11 NOTE — Interval H&P Note (Signed)
History and Physical Interval Note:  06/11/2012 1:35 PM  Melissa Deleon  has presented today for surgery, with the diagnosis of Pathologic fracture  The various methods of treatment have been discussed with the patient and family. After consideration of risks, benefits and other options for treatment, the patient has consented to  Procedure(s) with comments: KYPHOPLASTY (C-Arm x 2) (N/A) - Lumbar two Kyphoplasty as a surgical intervention .  The patient's history has been reviewed, patient examined, no change in status, stable for surgery.  I have reviewed the patient's chart and labs.  Questions were answered to the patient's satisfaction.     Leana Springston R

## 2012-06-11 NOTE — H&P (Signed)
See H& P.

## 2012-06-11 NOTE — Op Note (Signed)
06/11/2012  3:09 PM  PATIENT:  Melissa Deleon  79 y.o. female  PRE-OPERATIVE DIAGNOSIS:  L2 compression  fracture  POST-OPERATIVE DIAGNOSIS:  L2 compression  fracture  PROCEDURE:  Procedure(s): Balloon KYPHOPLASTY L2  With mythylmethacrylate,   Bone biopsy  ,   (C-Arm x 2) ,   SURGEON:  Surgeon(s): Clydene Fake, MD    ANESTHESIA:   general  EBL:  Total I/O In: 1000 [I.V.:1000] Out: -   BLOOD ADMINISTERED:none  DRAINS: none   SPECIMEN:  Core Needle Biopsy  DICTATION: Patient having back pain right upper leg pain postop for x-ray was done showing a compression fracture of L2 MRI was done also some compression fracture with edema only slight retropulsion it was decided to proceed with kyphoplasty of this L2 fracture.  Patient brought in from general anesthesia induced patient placed in prone position with an suspension and prepped draped sterile fashion AP and lateral fluoroscopy was used to find the L2 vertebral body was fractured into point for the left the pedicle was found to sent incision inject with 10 and 9 cc morcellized with epinephrine a sterile incision was then made in the bone he was placed it down to the pedicle and then placed through the pedicle into the vertebral body using fluoroscopy. Remove the interspinous and took a bone mumps biopsy of the L2 vertebral body. That's it's the left and we used drill to drill into the vertebral body then placed the kyphoplasty balloon and gently inflated the balloon while watching under fluoroscopy. Rheumatoid again across midline so entry points for the right side was found injected the skin with 8 cc of lidocaine with epinephrine stable incision made another subacromial placed and then to the pedicle and into and through the pedicle into the vertebral body using fluoroscopy as a guide cannula removed we used to drill to drill through the vertebral body and placed in the kyphoplasty and then alternating sides we carefully inflated  the balloon while watching on fluoroscopy at. Methylmethacrylate was mixed remove the balloon the right and injected the methylmethacrylate up to cc of methylmethacrylate was injected in the right side and we deflated the balloon on the left injected and then1 1/2-2 cc of methacrylate in the vertebral left side and. We did get methylmethacrylate across the hole vertebral body and we watch this under fluoroscopy. We were satisfied 7 we tapped down the methylmethacrylate with and then removed the working port some bilaterally took final AP and lateral for imaging. The 2 stab incisions closed with 1 subcuticular through Vicryl stitch and then skin closed with Dermabond and it was dry bandage placed over it to spots. Patient will transferred supine position woken (and transferred to recovery.  PLAN OF CARE: Admit for overnight observation  PATIENT DISPOSITION:  PACU - hemodynamically stable.

## 2012-06-11 NOTE — Progress Notes (Signed)

## 2012-06-12 NOTE — Progress Notes (Signed)
Pt doing well. Pt given D/C instructions with Rx, verbal understanding given. Pt D/C'd home via wheelchair with family per MD order. Rema Fendt, RN

## 2012-06-13 ENCOUNTER — Encounter (HOSPITAL_COMMUNITY): Payer: Self-pay | Admitting: Neurosurgery

## 2012-10-08 ENCOUNTER — Encounter: Payer: Self-pay | Admitting: Obstetrics & Gynecology

## 2012-10-08 ENCOUNTER — Ambulatory Visit (INDEPENDENT_AMBULATORY_CARE_PROVIDER_SITE_OTHER): Payer: Medicare Other | Admitting: Obstetrics & Gynecology

## 2012-10-08 VITALS — BP 120/70 | Wt 159.0 lb

## 2012-10-08 DIAGNOSIS — N3941 Urge incontinence: Secondary | ICD-10-CM

## 2012-10-08 DIAGNOSIS — N813 Complete uterovaginal prolapse: Secondary | ICD-10-CM

## 2012-10-08 MED ORDER — OXYBUTYNIN CHLORIDE ER 10 MG PO TB24: 10.0000 mg | ORAL_TABLET | Freq: Every day | ORAL | Status: DC

## 2012-10-08 NOTE — Patient Instructions (Addendum)
Prolapse  Prolapse means the falling down, bulging, dropping, or drooping of a body part. Organs that commonly prolapse include the rectum, small intestine, bladder, urethra, vagina (birth canal), uterus (womb), and cervix. Prolapse occurs when the ligaments and muscle tissue around the rectum, bladder, and uterus are damaged or weakened.  CAUSES  This happens especially with:  Childbirth. Some women feel pelvic pressure or have trouble holding their urine right after childbirth, because of stretching and tearing of pelvic tissues. This generally gets better with time and the feeling usually goes away, but it may return with aging.  Chronic heavy lifting.  Aging.  Menopause, with loss of estrogen production weakening the pelvic ligaments and muscles.  Past pelvic surgery.  Obesity.  Chronic constipation.  Chronic cough. Prolapse may affect a single organ, or several organs may prolapse at the same time. The front wall of the vagina holds up the bladder. The back wall holds up part of the lower intestine, or rectum. The uterus fills a spot in the middle. All these organs can be involved when the ligaments and muscles around the vagina relax too much. This often gets worse when women stop producing estrogen (menopause). SYMPTOMS  Uncontrolled loss of urine (incontinence) with cough, sneeze, straining, and exercise.  More force may be required to have a bowel movement, due to trapping of the stool.  When part of an organ bulges through the opening of the vagina, there is sometimes a feeling of heaviness or pressure. It may feel as though something is falling out. This sensation increases with coughing or bearing down.  If the organs protrude through the opening of the vagina and rub against the clothing, there may be soreness, ulcers, infection, pain, and bleeding.  Lower back pain.  Pushing in the upper or lower part of the vagina, to pass urine or have a bowel movement.  Problems  having sexual intercourse.  Being unable to insert a tampon or applicator. DIAGNOSIS  Usually, a physical exam is all that is needed to identify the problem. During the examination, you may be asked to cough and strain while lying down, sitting up, and standing up. Your caregiver will determine if more testing is required, such as bladder function tests. Some diagnoses are:  Cystocele: Bulging and falling of the bladder into the top of the vagina.  Rectocele: Part of the rectum bulging into the vagina.  Prolapse of the uterus: The uterus falls or drops into the vagina.  Enterocele: Bulging of the top of the vagina, after a hysterectomy (uterus removal), with the small intestine bulging into the vagina. A hernia in the top of the vagina.  Urethrocele: The urethra (urine carrying tube) bulging into the vagina. TREATMENT  In most cases, prolapse needs to be treated only if it produces symptoms. If the symptoms are interfering with your usual daily or sexual activities, treatment may be necessary. The following are some measures that may be used to treat prolapse.  Estrogen may help elderly women with mild prolapse.  Kegel exercises may help mild cases of prolapse, by strengthening and tightening the muscles of the pelvic floor.  Pessaries are used in women who choose not to, or are unable to, have surgery. A pessary is a doughnut-shaped piece of plastic or rubber that is put into the vagina to keep the organs in place. This device must be fitted by your caregiver. Your caregiver will also explain how to care for yourself with the pessary. If it works well for you,   this may be the only treatment required.  Surgery is often the only form of treatment for more severe prolapses. There are different types of surgery available. You should discuss what the best procedure is for you. If the uterus is prolapsed, it may be removed (hysterectomy) as part of the surgical treatment. Your caregiver will  discuss the risks and benefits with you.  Uterine-vaginal suspension (surgery to hold up the organs) may be used, especially if you want to maintain your fertility. No form of treatment is guaranteed to correct the prolapse or relieve the symptoms. HOME CARE INSTRUCTIONS   Wear a sanitary pad or absorbent product if you have incontinence of urine.  Avoid heavy lifting and straining with exercise and work.  Take over-the-counter pain medicine for minor discomfort.  Try taking estrogen or using estrogen vaginal cream.  Try Kegel exercises or use a pessary, before deciding to have surgery.  Do Kegel exercises after having a baby. SEEK MEDICAL CARE IF:   Your symptoms interfere with your daily activities.  You need medicine to help with the discomfort.  You need to be fitted with a pessary.  You notice bleeding from the vagina.  You think you have ulcers or you notice ulcers on the cervix.  You have an oral temperature above 102 F (38.9 C).  You develop pain or blood with urination.  You have bleeding with a bowel movement.  The symptoms are interfering with your sex life.  You have urinary incontinence that interferes with your daily activities.  You lose urine with sexual intercourse.  You have a chronic cough.  You have chronic constipation. Document Released: 10/08/2002 Document Revised: 06/26/2011 Document Reviewed: 04/18/2009 [employer] Patient Information 2014 [employer].  

## 2012-10-08 NOTE — Progress Notes (Signed)
Patient ID: Melissa Deleon, female   DOB: April 01, 1934, 79 y.o.   MRN: [id number]      Melissa Deleon presents today for routine follow up related to her pessary.   She uses a Milex ring with support 2 1/2 inches She reports little vaginal discharge or vaginal bleeding.  Exam reveals no undue vaginal mucosal pressure of breakdown, no discharge and no vaginal bleeding.  The pessary is removed, cleaned and replaced without difficulty.    Consuelo Pandy will be sen back in 3 months for continued follow up.  EURE,LUTHER H 10/08/2012 10:25 AM

## 2013-01-07 ENCOUNTER — Encounter: Payer: Self-pay | Admitting: Obstetrics & Gynecology

## 2013-01-07 ENCOUNTER — Ambulatory Visit (INDEPENDENT_AMBULATORY_CARE_PROVIDER_SITE_OTHER): Payer: Medicare Other | Admitting: Obstetrics & Gynecology

## 2013-01-07 VITALS — BP 140/82 | Ht 62.0 in | Wt 161.0 lb

## 2013-01-07 DIAGNOSIS — N813 Complete uterovaginal prolapse: Secondary | ICD-10-CM

## 2013-01-07 NOTE — Progress Notes (Signed)
Patient ID: LARAINA SULTON, female   DOB: 11/30/1933, 79 y.o.   MRN: [id number] Patient ID: LYNLEIGH KOVACK, female   DOB: [birth year], 79 y.o.   MRN: [id number]      KANNON BAUM presents today for routine follow up related to her pessary.   She uses a Milex ring with support 2 1/2 inches She reports little vaginal discharge or vaginal bleeding.  Exam reveals no undue vaginal mucosal pressure of breakdown, no discharge and no vaginal bleeding.  The pessary is removed, cleaned and replaced without difficulty.    Consuelo Pandy will be sen back in 3 months for continued follow up.  EURE,LUTHER H 01/07/2013 9:27 AM

## 2013-01-16 ENCOUNTER — Other Ambulatory Visit (HOSPITAL_COMMUNITY): Payer: Self-pay | Admitting: Internal Medicine

## 2013-01-16 DIAGNOSIS — Z139 Encounter for screening, unspecified: Secondary | ICD-10-CM

## 2013-02-26 ENCOUNTER — Encounter (INDEPENDENT_AMBULATORY_CARE_PROVIDER_SITE_OTHER): Payer: Self-pay

## 2013-02-26 ENCOUNTER — Encounter: Payer: Self-pay | Admitting: Obstetrics & Gynecology

## 2013-02-26 ENCOUNTER — Ambulatory Visit (INDEPENDENT_AMBULATORY_CARE_PROVIDER_SITE_OTHER): Payer: Medicare Other | Admitting: Obstetrics & Gynecology

## 2013-02-26 VITALS — BP 120/70 | Wt 160.0 lb

## 2013-02-26 DIAGNOSIS — N813 Complete uterovaginal prolapse: Secondary | ICD-10-CM

## 2013-02-26 NOTE — Progress Notes (Deleted)
Patient ID: Melissa Deleon, female   DOB: April 23, 1933, 79 y.o.   MRN: [id number] Subjective:     Melissa Deleon is a 79 y.o. female here for a routine exam.  No LMP recorded. Patient is postmenopausal. No obstetric history on file. Current complaints: none, wants STD check.     Gynecologic History No LMP recorded. Patient is postmenopausal. Contraception: OCP (estrogen/progesterone) Last Pap: 2013. Results were: normal Last mammogram: 2014. Results were: normal  Past Medical History  Diagnosis Date  . Hypertension   . GERD (gastroesophageal reflux disease)     Past Surgical History  Procedure Laterality Date  . No past surgeries      RING PLACED TO HOLD BLADDER   . Lumbar laminectomy/decompression microdiscectomy  03/07/2012    Procedure: LUMBAR LAMINECTOMY/DECOMPRESSION MICRODISCECTOMY 1 LEVEL;  Surgeon: Clydene Fake, MD;  Location: [facility];  Service: Neurosurgery;  Laterality: Left;  Left Lumbar four-five Laminectomy for Synovial cyst  . Bladder ring    . Back surgery    . Kyphoplasty N/A 06/11/2012    Procedure: KYPHOPLASTY (C-Arm x 2);  Surgeon: Clydene Fake, MD;  Location: [facility] [place] [facility];  Service: Neurosurgery;  Laterality: N/A;  Lumbar two Kyphoplasty    OB History   Grav Para Term Preterm Abortions TAB SAB Ect Mult Living                  History   Social History  . Marital Status: Widowed    Spouse Name: N/A    Number of Children: N/A  . Years of Education: N/A   Social History Main Topics  . Smoking status: Never Smoker   . Smokeless tobacco: Never Used  . Alcohol Use: No  . Drug Use: No  . Sexual Activity: Not Currently    Birth Control/ Protection: Post-menopausal   Other Topics Concern  . None   Social History Narrative  . None    Family History  Problem Relation Age of Onset  . Diabetes Mother   . Heart attack Father   . Cancer Sister     breast     Review of Systems  Review of Systems  Constitutional: Negative for fever, chills,  weight loss, malaise/fatigue and diaphoresis.  HENT: Negative for hearing loss, ear pain, nosebleeds, congestion, sore throat, neck pain, tinnitus and ear discharge.   Eyes: Negative for blurred vision, double vision, photophobia, pain, discharge and redness.  Respiratory: Negative for cough, hemoptysis, sputum production, shortness of breath, wheezing and stridor.   Cardiovascular: Negative for chest pain, palpitations, orthopnea, claudication, leg swelling and PND.  Gastrointestinal: negative for abdominal pain. Negative for heartburn, nausea, vomiting, diarrhea, constipation, blood in stool and melena.  Genitourinary: Negative for dysuria, urgency, frequency, hematuria and flank pain.  Musculoskeletal: Negative for myalgias, back pain, joint pain and falls.  Skin: Negative for itching and rash.  Neurological: Negative for dizziness, tingling, tremors, sensory change, speech change, focal weakness, seizures, loss of consciousness, weakness and headaches.  Endo/Heme/Allergies: Negative for environmental allergies and polydipsia. Does not bruise/bleed easily.  Psychiatric/Behavioral: Negative for depression, suicidal ideas, hallucinations, memory loss and substance abuse. The patient is not nervous/anxious and does not have insomnia.        Objective:    Physical Exam  Vitals reviewed. Constitutional: She is oriented to person, place, and time. She appears well-developed and well-nourished.  HENT:  Head: Normocephalic and atraumatic.        Right Ear: External ear normal.  Left Ear: External  ear normal.  Nose: Nose normal.  Mouth/Throat: Oropharynx is clear and moist.  Eyes: Conjunctivae and EOM are normal. Pupils are equal, round, and reactive to light. Right eye exhibits no discharge. Left eye exhibits no discharge. No scleral icterus.  Neck: Normal range of motion. Neck supple. No tracheal deviation present. No thyromegaly present.  Cardiovascular: Normal rate, regular rhythm, normal  heart sounds and intact distal pulses.  Exam reveals no gallop and no friction rub.   No murmur heard. Respiratory: Effort normal and breath sounds normal. No respiratory distress. She has no wheezes. She has no rales. She exhibits no tenderness.  GI: Soft. Bowel sounds are normal. She exhibits no distension and no mass. There is no tenderness. There is no rebound and no guarding.  Genitourinary:  Breasts no masses skin changes or nipple changes bilaterally      Vulva is normal without lesions Vagina is pink moist without discharge Cervix normal in appearance and pap is done Uterus is normal size shape and contour Adnexa is negative with normal sized ovaries  Rectal    Musculoskeletal: Normal range of motion. She exhibits no edema and no tenderness.  Neurological: She is alert and oriented to person, place, and time. She has normal reflexes. She displays normal reflexes. No cranial nerve deficit. She exhibits normal muscle tone. Coordination normal.  Skin: Skin is warm and dry. No rash noted. No erythema. No pallor.  Psychiatric: She has a normal mood and affect. Her behavior is normal. Judgment and thought content normal.       Assessment:    Healthy female exam.    Plan:    Contraception: OCP (estrogen/progesterone). Mammogram ordered. Follow up in: 1 year.

## 2013-02-26 NOTE — Progress Notes (Signed)
Patient ID: [name], female   DOB: 02-14-1934, 79 y.o.   MRN: [id number] Patient ID: [name], female   DOB: [birth year], 78 y.o.   MRN: [id number] Patient ID: [name], female   DOB: 08-14-1933, 79 y.o.   MRN: [id number]      Melissa Deleon presents today for routine follow up related to her pessary.   She uses a Milex ring with support 2 1/2 inches She reports little vaginal discharge or vaginal bleeding.  Exam reveals no undue vaginal mucosal pressure of breakdown, no discharge and no vaginal bleeding.  The pessary is removed, cleaned and replaced without difficulty.    Consuelo Pandy will be sen back in 3 months for continued follow up.  Lazaro Arms 02/26/2013 2:59 PM

## 2013-02-27 ENCOUNTER — Ambulatory Visit (HOSPITAL_COMMUNITY)
Admission: RE | Admit: 2013-02-27 | Discharge: 2013-02-27 | Disposition: A | Discharge status: Home / Self Care | Payer: Medicare Other | Source: Ambulatory Visit | Attending: Internal Medicine | Admitting: Internal Medicine

## 2013-02-27 ENCOUNTER — Ambulatory Visit (HOSPITAL_COMMUNITY): Payer: Medicare Other

## 2013-02-27 DIAGNOSIS — Z1231 Encounter for screening mammogram for malignant neoplasm of breast: Secondary | ICD-10-CM | POA: Insufficient documentation

## 2013-02-27 DIAGNOSIS — Z139 Encounter for screening, unspecified: Secondary | ICD-10-CM

## 2013-04-08 ENCOUNTER — Ambulatory Visit: Payer: Medicare Other | Admitting: Obstetrics & Gynecology

## 2013-06-30 ENCOUNTER — Encounter: Payer: Self-pay | Admitting: Obstetrics & Gynecology

## 2013-06-30 ENCOUNTER — Ambulatory Visit (INDEPENDENT_AMBULATORY_CARE_PROVIDER_SITE_OTHER): Payer: Medicare Other | Admitting: Obstetrics & Gynecology

## 2013-06-30 ENCOUNTER — Encounter (INDEPENDENT_AMBULATORY_CARE_PROVIDER_SITE_OTHER): Payer: Self-pay

## 2013-06-30 VITALS — BP 130/80 | Wt 164.0 lb

## 2013-06-30 DIAGNOSIS — N813 Complete uterovaginal prolapse: Secondary | ICD-10-CM

## 2013-06-30 NOTE — Progress Notes (Signed)
Patient ID: [name], female   DOB: [birth year], 80 y.o.   MRN: [id number] Patient ID: [name], female   DOB: 01/31/1934, 80 y.o.   MRN: [id number] Patient ID: [name], female   DOB: 07/01/1933, 80 y.o.   MRN: [id number] Patient ID: [name], female   DOB: 1933/10/16, 80 y.o.   MRN: [id number]      Melissa Deleon presents today for routine follow up related to her pessary.   She uses a Milex ring with support 2 1/2 inches She reports little vaginal discharge or vaginal bleeding.  Exam reveals no undue vaginal mucosal pressure of breakdown, no discharge and 1 spot of vaginal bleeding  The pessary is removed, cleaned and replaced without difficulty.    Melissa Deleon will be sen back in 3 months for continued follow up.  EURE,LUTHER H 06/30/2013 10:42 AM

## 2013-10-06 ENCOUNTER — Ambulatory Visit (INDEPENDENT_AMBULATORY_CARE_PROVIDER_SITE_OTHER): Payer: Medicare Other | Admitting: Obstetrics & Gynecology

## 2013-10-06 ENCOUNTER — Encounter: Payer: Self-pay | Admitting: Obstetrics & Gynecology

## 2013-10-06 VITALS — BP 116/60 | Wt 164.0 lb

## 2013-10-06 DIAGNOSIS — N390 Urinary tract infection, site not specified: Secondary | ICD-10-CM | POA: Diagnosis not present

## 2013-10-06 DIAGNOSIS — N3941 Urge incontinence: Secondary | ICD-10-CM

## 2013-10-06 DIAGNOSIS — N813 Complete uterovaginal prolapse: Secondary | ICD-10-CM

## 2013-10-06 LAB — POCT URINALYSIS DIPSTICK
Glucose, UA: NEGATIVE
Ketones, UA: NEGATIVE
Nitrite, UA: POSITIVE
Protein, UA: NEGATIVE
RBC UA: NEGATIVE

## 2013-10-06 MED ORDER — NITROFURANTOIN MONOHYD MACRO 100 MG PO CAPS: 100.0000 mg | ORAL_CAPSULE | Freq: Two times a day (BID) | ORAL | Status: DC

## 2013-10-06 NOTE — Addendum Note (Signed)
Addended by: Doyne Keel on: 10/06/2013 10:28 AM   Modules accepted: Orders

## 2013-10-06 NOTE — Progress Notes (Signed)
Patient ID: Melissa Deleon, female   DOB: 1933/09/14, 80 y.o.   MRN: [id number] Patient ID: Melissa Deleon, female   DOB: 06-07-1933, 80 y.o.   MRN: [id number] Patient ID: Melissa Deleon, female   DOB: 07-Nov-1933, 80 y.o.   MRN: [id number] Patient ID: Melissa Deleon, female   DOB: 02-24-1934, 80 y.o.   MRN: [id number] Patient ID: Melissa Deleon, female   DOB: 08/22/1933, 80 y.o.   MRN: [id number]      Melissa Deleon presents today for routine follow up related to her pessary.   She also has detrusor instability and urge incontinence and uses ditropan xl 10 mg qhs She uses a Milex ring with support 2 1/2 inches She reports little vaginal discharge or vaginal bleeding.  Exam reveals \\NEFG   no undue vaginal mucosal pressure of breakdown, no discharge and 1 spot of vaginal bleeding  ROS No burning with urination, frequency or urgency No nausea, vomiting or diarrhea Nor fever chills or other constitutional symptoms    The pessary is removed, cleaned and replaced without difficulty.   Urinalysis today is positive for nitrates  Impresssion?plan Uterovaginal prolapse: stable on her Milex ring and follow up in 3 months for removal and cleaning and reassessment  Urge incontinence: stable on ditropan XL  UTI: treat with macrobid 100 BID x 7days   KAMRIN SPATH will be sen back in 3 months for continued follow up.  EURE,LUTHER H 10/06/2013 9:23 AM

## 2013-10-08 ENCOUNTER — Telehealth: Payer: Self-pay | Admitting: Obstetrics & Gynecology

## 2013-10-08 LAB — URINE CULTURE

## 2013-10-08 MED ORDER — CEPHALEXIN 500 MG PO CAPS: 500.0000 mg | ORAL_CAPSULE | Freq: Three times a day (TID) | ORAL | Status: DC

## 2013-10-08 NOTE — Telephone Encounter (Signed)
+  UTI I prescribed macrobid and it is resistant Keflex is e prescribed

## 2013-10-09 NOTE — Telephone Encounter (Signed)
Pt informed Keflex e-scribed to [facility], stop taking the Macrobid due to resistance per Dr. Elonda Husky. Pt verbalized understanding.

## 2014-01-06 ENCOUNTER — Encounter: Payer: Self-pay | Admitting: Obstetrics & Gynecology

## 2014-01-06 ENCOUNTER — Ambulatory Visit (INDEPENDENT_AMBULATORY_CARE_PROVIDER_SITE_OTHER): Payer: Medicare Other | Admitting: Obstetrics & Gynecology

## 2014-01-06 VITALS — BP 150/90 | Wt 164.0 lb

## 2014-01-06 DIAGNOSIS — N813 Complete uterovaginal prolapse: Secondary | ICD-10-CM | POA: Diagnosis not present

## 2014-01-06 DIAGNOSIS — N3941 Urge incontinence: Secondary | ICD-10-CM

## 2014-01-06 MED ORDER — METRONIDAZOLE 500 MG PO TABS: 500.0000 mg | ORAL_TABLET | Freq: Two times a day (BID) | ORAL | Status: DC

## 2014-01-06 NOTE — Progress Notes (Signed)
Patient ID: Melissa Deleon, female   DOB: [birth year], 80 y.o.   MRN: [id number] Melissa Deleon presents today for routine follow up related to her pessary.  She uses a Milex ring with support 2 1/2 inches  She reports little vaginal discharge or vaginal bleeding.  Exam reveals no undue vaginal mucosal pressure of breakdown, no discharge and no vaginal bleeding.  The pessary is removed, cleaned and replaced without difficulty.  [name] will be sen back in 3 months for continued follow up.

## 2014-01-16 ENCOUNTER — Telehealth: Payer: Self-pay | Admitting: *Deleted

## 2014-01-16 MED ORDER — CEPHALEXIN 500 MG PO CAPS: 500.0000 mg | ORAL_CAPSULE | Freq: Three times a day (TID) | ORAL | Status: DC

## 2014-01-16 NOTE — Telephone Encounter (Signed)
Keflex refilled.

## 2014-01-16 NOTE — Telephone Encounter (Signed)
Pt aware Keflex e-scribed.

## 2014-01-22 ENCOUNTER — Other Ambulatory Visit: Payer: Self-pay | Admitting: Obstetrics and Gynecology

## 2014-01-22 DIAGNOSIS — Z1231 Encounter for screening mammogram for malignant neoplasm of breast: Secondary | ICD-10-CM

## 2014-01-28 ENCOUNTER — Ambulatory Visit (HOSPITAL_COMMUNITY)
Admission: RE | Admit: 2014-01-28 | Discharge: 2014-01-28 | Disposition: A | Discharge status: Home / Self Care | Payer: Medicare Other | Source: Ambulatory Visit | Attending: Obstetrics and Gynecology | Admitting: Obstetrics and Gynecology

## 2014-01-28 DIAGNOSIS — Z1231 Encounter for screening mammogram for malignant neoplasm of breast: Secondary | ICD-10-CM

## 2014-03-02 ENCOUNTER — Ambulatory Visit (HOSPITAL_COMMUNITY)
Admission: RE | Admit: 2014-03-02 | Discharge: 2014-03-02 | Disposition: A | Discharge status: Home / Self Care | Payer: Medicare Other | Source: Ambulatory Visit | Attending: Obstetrics and Gynecology | Admitting: Obstetrics and Gynecology

## 2014-03-02 DIAGNOSIS — Z1231 Encounter for screening mammogram for malignant neoplasm of breast: Secondary | ICD-10-CM | POA: Diagnosis not present

## 2014-03-02 DIAGNOSIS — R921 Mammographic calcification found on diagnostic imaging of breast: Secondary | ICD-10-CM | POA: Insufficient documentation

## 2014-03-03 ENCOUNTER — Other Ambulatory Visit: Payer: Self-pay | Admitting: Obstetrics and Gynecology

## 2014-03-03 DIAGNOSIS — R928 Other abnormal and inconclusive findings on diagnostic imaging of breast: Secondary | ICD-10-CM

## 2014-03-17 ENCOUNTER — Other Ambulatory Visit: Payer: Self-pay | Admitting: Obstetrics and Gynecology

## 2014-03-17 ENCOUNTER — Ambulatory Visit (HOSPITAL_COMMUNITY)
Admission: RE | Admit: 2014-03-17 | Discharge: 2014-03-17 | Disposition: A | Discharge status: Home / Self Care | Payer: Medicare Other | Source: Ambulatory Visit | Attending: Obstetrics and Gynecology | Admitting: Obstetrics and Gynecology

## 2014-03-17 DIAGNOSIS — R928 Other abnormal and inconclusive findings on diagnostic imaging of breast: Secondary | ICD-10-CM | POA: Diagnosis present

## 2014-03-17 DIAGNOSIS — R921 Mammographic calcification found on diagnostic imaging of breast: Secondary | ICD-10-CM

## 2014-03-31 ENCOUNTER — Ambulatory Visit (INDEPENDENT_AMBULATORY_CARE_PROVIDER_SITE_OTHER): Payer: Medicare Other | Admitting: Obstetrics & Gynecology

## 2014-03-31 ENCOUNTER — Encounter: Payer: Self-pay | Admitting: Obstetrics & Gynecology

## 2014-03-31 VITALS — BP 142/90 | Wt 156.0 lb

## 2014-03-31 DIAGNOSIS — N813 Complete uterovaginal prolapse: Secondary | ICD-10-CM | POA: Diagnosis not present

## 2014-03-31 NOTE — Progress Notes (Signed)
Patient ID: [name], female   DOB: 10/21/1933, 80 y.o.   MRN: [id number]      Melissa Deleon presents today for routine follow up related to her pessary.   She uses a Milex ring with support #4 She reports no vaginal discharge or vaginal bleeding.  Exam reveals no undue vaginal mucosal pressure of breakdown, no discharge and no vaginal bleeding.  The pessary is removed, cleaned and replaced without difficulty.    [name] will be sen back in 3 months for continued follow up.  [name] H 03/31/2014 10:34 AM

## 2014-04-01 ENCOUNTER — Ambulatory Visit
Admission: RE | Admit: 2014-04-01 | Discharge: 2014-04-01 | Disposition: A | Discharge status: Home / Self Care | Payer: Medicare Other | Source: Ambulatory Visit | Attending: Obstetrics and Gynecology | Admitting: Obstetrics and Gynecology

## 2014-04-01 DIAGNOSIS — R921 Mammographic calcification found on diagnostic imaging of breast: Secondary | ICD-10-CM

## 2014-04-22 ENCOUNTER — Other Ambulatory Visit: Payer: Self-pay | Admitting: Obstetrics & Gynecology

## 2014-04-30 ENCOUNTER — Telehealth: Payer: Self-pay | Admitting: Obstetrics & Gynecology

## 2014-05-01 NOTE — Telephone Encounter (Signed)
Pt informed received her forms for prior auth for the oxybutynin and will attempt to contact insurance for the approval. Pt verbalized understanding.

## 2014-05-18 DIAGNOSIS — C50919 Malignant neoplasm of unspecified site of unspecified female breast: Secondary | ICD-10-CM

## 2014-05-18 HISTORY — DX: Malignant neoplasm of unspecified site of unspecified female breast: C50.919

## 2014-05-26 ENCOUNTER — Other Ambulatory Visit (HOSPITAL_COMMUNITY): Payer: Self-pay | Admitting: General Surgery

## 2014-05-26 DIAGNOSIS — C50911 Malignant neoplasm of unspecified site of right female breast: Secondary | ICD-10-CM

## 2014-05-27 NOTE — H&P (Addendum)
  NTS SOAP Note  Vital Signs:  Vitals as of: 6/56/8127: Systolic 517: Diastolic 83: Heart Rate 81: Temp 98.48F: Height 75ft 2in: Weight 159Lbs 0 Ounces: BMI 29.08  BMI : 29.08 kg/m2  Subjective: This 81 year old female presents for of an abnormal right breast biopsy.  Found on routine mammography.  Biopsy showed some atypical cells,  but no malignancy.  Sister had breast cancer in her 39's,  has since past away.  No nipple discharge.   Review of Symptoms:  Constitutional:unremarkable   Head:unremarkable Eyes:unremarkable   Nose/Mouth/Throat:unremarkable Cardiovascular:  unremarkable Respiratory:unremarkable Gastrointestinheartburn Genitourinary:frequency, urinary hesitancy back,  neck,  and joint pain Skin:unremarkable Hematolgic/Lymphatic:unremarkable   Allergic/Immunologic:unremarkable   Past Medical History:  Reviewed  Past Medical History  Surgical History: back surgery Medical Problems: HTN,  high cholesterol Allergies: sulfur Medications: lisinopril,  pravastatin,  oxybutrin   Social History:Reviewed  Social History  Preferred Language: English Race:  White Ethnicity: Not Hispanic / Latino Age: 78 year Marital Status:  S Alcohol: no   Smoking Status: Never smoker reviewed on 04/23/2014 Functional Status reviewed on 04/23/2014 ------------------------------------------------ Bathing: Normal Cooking: Normal Dressing: Normal Driving: Normal Eating: Normal Managing Meds: Normal Oral Care: Normal Shopping: Normal Toileting: Normal Transferring: Normal Walking: Normal Cognitive Status reviewed on 04/23/2014 ------------------------------------------------ Attention: Normal Decision Making: Normal Language: Normal Memory: Normal Motor: Normal Perception: Normal Problem Solving: Normal Visual and Spatial: Normal   Family History:Reviewed  Family Health History Mother  Father  Sister, Deceased; Breast cancer;      Objective Information: General:Well appearing, well nourished in no distress. Neck:Supple without lymphadenopathy.  Heart:RRR, no murmur or gallop.  Normal S1, S2.  No S3, S4.  Lungs:  CTA bilaterally, no wheezes, rhonchi, rales.  Breathing unlabored. No dominant mass,  nipple discharge,  dimplingin either breast.  Axilla negative for palpable nodes.  Path results discussed with Pathologist,  Dr. Saralyn Pilar,  very focal finding. 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  She is at higher risk for breast cancer given her sister.  I cannot give her 100% guarantee that she does not have breast cancer without a biopsy.  She understands that,  and will give me a call with her decision about a right breast biopsy after needle localization.   Patient Education:Alternative treatments to surgery were discussed with patient (and family).  Risks and benefits  of procedure were fully explained to the patient (and family) who gave informed consent. Patient/family questions were addressed.  Follow-up:Pending Surgery

## 2014-06-01 NOTE — H&P (Signed)
  NTS SOAP Note  Vital Signs:  Vitals as of: 10/16/7791: Systolic 903: Diastolic 83: Heart Rate 81: Temp 98.61F: Height 74ft 2in: Weight 159Lbs 0 Ounces: BMI 29.08  BMI : 29.08 kg/m2  Subjective: This 81 year old female presents for of an abnormal right breast biopsy.  Found on routine mammography.  Biopsy showed some atypical cells,  but no malignancy.  Sister had breast cancer in her 49's,  has since past away.  No nipple discharge.   Review of Symptoms:  Constitutional:unremarkable   Head:unremarkable Eyes:unremarkable   Nose/Mouth/Throat:unremarkable Cardiovascular:  unremarkable Respiratory:unremarkable Gastrointestinheartburn Genitourinary:frequency, urinary hesitancy back,  neck,  and joint pain Skin:unremarkable Hematolgic/Lymphatic:unremarkable   Allergic/Immunologic:unremarkable   Past Medical History:  Reviewed  Past Medical History  Surgical History: back surgery Medical Problems: HTN,  high cholesterol Allergies: sulfur Medications: lisinopril,  pravastatin,  oxybutrin   Social History:Reviewed  Social History  Preferred Language: English Race:  White Ethnicity: Not Hispanic / Latino Age: 12 year Marital Status:  S Alcohol: no   Smoking Status: Never smoker reviewed on 04/23/2014 Functional Status reviewed on 04/23/2014 ------------------------------------------------ Bathing: Normal Cooking: Normal Dressing: Normal Driving: Normal Eating: Normal Managing Meds: Normal Oral Care: Normal Shopping: Normal Toileting: Normal Transferring: Normal Walking: Normal Cognitive Status reviewed on 04/23/2014 ------------------------------------------------ Attention: Normal Decision Making: Normal Language: Normal Memory: Normal Motor: Normal Perception: Normal Problem Solving: Normal Visual and Spatial: Normal   Family History:Reviewed  Family Health History Mother  Father  Sister, Deceased; Breast cancer;      Objective Information: General:Well appearing, well nourished in no distress. Neck:Supple without lymphadenopathy.  Heart:RRR, no murmur or gallop.  Normal S1, S2.  No S3, S4.  Lungs:  CTA bilaterally, no wheezes, rhonchi, rales.  Breathing unlabored. No dominant mass,  nipple discharge,  dimplingin either breast.  Axilla negative for palpable nodes.  Path results discussed with Pathologist,  Dr. Saralyn Pilar,  very focal finding. 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  She is at higher risk for breast cancer given her sister.  I cannot give her 100% guarantee that she does not have breast cancer without a biopsy.  She understands that,  and will give me a call with her decision about a right breast biopsy after needle localization.   Patient Education:Alternative treatments to surgery were discussed with patient (and family).  Risks and benefits  of procedure were fully explained to the patient (and family) who gave informed consent. Patient/family questions were addressed.  Follow-up:Pending Surgery

## 2014-06-02 ENCOUNTER — Telehealth: Payer: Self-pay | Admitting: *Deleted

## 2014-06-02 ENCOUNTER — Telehealth: Payer: Self-pay | Admitting: Obstetrics & Gynecology

## 2014-06-02 NOTE — Telephone Encounter (Signed)
Pt aware [facility] called this am in regards to insurance not covering medication, message routed to Dr. Elonda Husky.

## 2014-06-02 NOTE — Telephone Encounter (Signed)
[facility] called stating pt insurance would not cover the Oxybutynin. Is there an alternative?

## 2014-06-03 ENCOUNTER — Telehealth: Payer: Self-pay | Admitting: Obstetrics & Gynecology

## 2014-06-04 MED ORDER — TOLTERODINE TARTRATE ER 4 MG PO CP24: ORAL_CAPSULE | ORAL | Status: DC

## 2014-06-04 NOTE — Patient Instructions (Signed)
Melissa Deleon  06/04/2014   Your procedure is scheduled on:  06/10/2014  Report to [facility] at 8:00 AM.  Call this number if you have problems the morning of surgery: [phone]   Remember:   Do not eat food or drink liquids after midnight.   Take these medicines the morning of surgery with A SIP OF WATER: Flexeril, Hydrocodone, Lisinopril, Prilosec, Ditropan   Do not wear jewelry, make-up or nail polish.  Do not wear lotions, powders, or perfumes. You may wear deodorant.  Do not shave 48 hours prior to surgery. Men may shave face and neck.  Do not bring valuables to the hospital.  [facility] is not responsible for any belongings or valuables.               Contacts, dentures or bridgework may not be worn into surgery.  Leave suitcase in the car. After surgery it may be brought to your room.  For patients admitted to the hospital, discharge time is determined by your treatment team.               Patients discharged the day of surgery will not be allowed to drive home.  Name and phone number of your driver:   Special Instructions: Shower using CHG 2 nights before surgery and the night before surgery.  If you shower the day of surgery use CHG.  Use special wash - you have one bottle of CHG for all showers.  You should use approximately 1/3 of the bottle for each shower.   Please read over the following fact sheets that you were given: Surgical Site Infection Prevention and Anesthesia Post-op Instructions   PATIENT INSTRUCTIONS POST-ANESTHESIA  IMMEDIATELY FOLLOWING SURGERY:  Do not drive or operate machinery for the first twenty four hours after surgery.  Do not make any important decisions for twenty four hours after surgery or while taking narcotic pain medications or sedatives.  If you develop intractable nausea and vomiting or a severe headache please notify your doctor immediately.  FOLLOW-UP:  Please make an appointment with your surgeon as instructed. You do not need to  follow up with anesthesia unless specifically instructed to do so.  WOUND CARE INSTRUCTIONS (if applicable):  Keep a dry clean dressing on the anesthesia/puncture wound site if there is drainage.  Once the wound has quit draining you may leave it open to air.  Generally you should leave the bandage intact for twenty four hours unless there is drainage.  If the epidural site drains for more than 36-48 hours please call the anesthesia department.  QUESTIONS?:  Please feel free to call your physician or the hospital operator if you have any questions, and they will be happy to assist you.      Breast Biopsy A breast biopsy is a procedure where a sample of breast tissue is removed from your breast. The tissue is examined under a microscope to see if cancerous cells are present. A breast biopsy is done when there is:  Any undiagnosed breast mass (tumor).  Nipple abnormalities, dimpling, crusting, or ulcerations.  Abnormal discharge from the nipple, especially blood.  Redness, swelling, and pain of the breast.  Calcium deposits (calcifications) or abnormalities seen on a mammogram, ultrasound result, or results of magnetic resonance imaging (MRI).  Suspicious changes in the breast seen on your mammogram. If the tumor is found to be cancerous (malignant), a breast biopsy can help to determine what the best treatment is for you. There are many  different types of breast biopsies. Talk to your caregiver about your options and which type is best for you. LET YOUR CAREGIVER KNOW ABOUT:  Allergies to food or medicine.  Medicines taken, including vitamins, herbs, eyedrops, over-the-counter medicines, and creams.  Use of steroids (by mouth or creams).  Previous problems with anesthetics or numbing medicines.  History of bleeding problems or blood clots.  Previous surgery.  Other health problems, including diabetes and kidney problems.  Any recent colds or infections.  Possibility of  pregnancy, if this applies. RISKS AND COMPLICATIONS   Bleeding.  Infection.  Allergy to medicines.  Bruising and swelling of the breast.  Alteration in the shape of the breast.  Not finding the lump or abnormality.  Needing more surgery. BEFORE THE PROCEDURE  Arrange for someone to drive you home after the procedure.  Do not smoke for 2 weeks before the procedure. Stop smoking, if you smoke.  Do not drink alcohol for 24 hours before procedure.  Wear a good support bra to the procedure. PROCEDURE  You may be given a medicine to numb the breast area (local anesthesia) or a medicine to make you sleep (general anesthesia) during the procedure. The following are the different types of biopsies that can be performed.   Fine-needle aspiration--A thin needle is attached to a syringe and inserted into the breast lump. Fluid and cells are removed and then looked at under a microscope. If the breast lump cannot be felt, an ultrasound may be used to help locate the lump and place the needle in the correct area.   Core needle biopsy--A wide, hollow needle (core needle) is inserted into the breast lump 3-6 times to get tissue samples or cores. The samples are removed. The needle is usually placed in the correct area by using an ultrasound or X-ray.   Stereotactic biopsy--X-ray equipment and a computer are used to analyze X-ray pictures of the breast lump. The computer then finds exactly where the core needle needs to be inserted. Tissue samples are removed.   Vacuum-assisted biopsy--A small incision (less than  inch) is made in your breast. A biopsy device that includes a hollow needle and vacuum is passed through the incision and into the breast tissue. The vacuum gently draws abnormal breast tissue into the needle to remove it. This type of biopsy removes a larger tissue sample than a regular core needle biopsy. No stitches are needed, and there is usually little  scarring.  Ultrasound-guided core needle biopsy--A high frequency ultrasound helps guide the core needle to the area of the mass or abnormality. An incision is made to insert the needle. Tissue samples are removed.  Open biopsy--A larger incision is made in the breast. Your caregiver will attempt to remove the whole breast lump or as much as possible. AFTER THE PROCEDURE  You will be taken to the recovery area. If you are doing well and have no problems, you will be allowed to go home.  You may notice bruising on your breast. This is normal.  Your caregiver may apply a pressure dressing on your breast for 24-48 hours. A pressure dressing is a bandage that is wrapped tightly around the chest to stop fluid from collecting underneath tissues. Document Released: 04/03/2005 Document Revised: 07/29/2012 Document Reviewed: 05/04/2011 [facility] Patient Information 2015 [place], [place]. This information is not intended to replace advice given to you by your health care provider. Make sure you discuss any questions you have with your health care provider.

## 2014-06-04 NOTE — Telephone Encounter (Signed)
detrol LA 4 mg e prescribed

## 2014-06-05 ENCOUNTER — Other Ambulatory Visit: Payer: Self-pay

## 2014-06-05 ENCOUNTER — Encounter (HOSPITAL_COMMUNITY)
Admission: RE | Admit: 2014-06-05 | Discharge: 2014-06-05 | Disposition: A | Discharge status: Home / Self Care | Payer: Medicare Other | Source: Ambulatory Visit | Attending: General Surgery | Admitting: General Surgery

## 2014-06-05 ENCOUNTER — Encounter (HOSPITAL_COMMUNITY): Payer: Self-pay

## 2014-06-05 DIAGNOSIS — D493 Neoplasm of unspecified behavior of breast: Secondary | ICD-10-CM | POA: Insufficient documentation

## 2014-06-05 DIAGNOSIS — Z01818 Encounter for other preprocedural examination: Secondary | ICD-10-CM | POA: Insufficient documentation

## 2014-06-05 LAB — CBC WITH DIFFERENTIAL/PLATELET
BASOS ABS: 0.1 10*3/uL (ref 0.0–0.1)
BASOS PCT: 1 % (ref 0–1)
EOS ABS: 0.4 10*3/uL (ref 0.0–0.7)
EOS PCT: 6 % — AB (ref 0–5)
HEMATOCRIT: 39.7 % (ref 36.0–46.0)
Hemoglobin: 12.7 g/dL (ref 12.0–15.0)
Lymphocytes Relative: 39 % (ref 12–46)
Lymphs Abs: 2.4 10*3/uL (ref 0.7–4.0)
MCH: 29.1 pg (ref 26.0–34.0)
MCHC: 32 g/dL (ref 30.0–36.0)
MCV: 91.1 fL (ref 78.0–100.0)
MONOS PCT: 9 % (ref 3–12)
Monocytes Absolute: 0.6 10*3/uL (ref 0.1–1.0)
NEUTROS ABS: 2.8 10*3/uL (ref 1.7–7.7)
Neutrophils Relative %: 45 % (ref 43–77)
Platelets: 302 10*3/uL (ref 150–400)
RBC: 4.36 MIL/uL (ref 3.87–5.11)
RDW: 12.5 % (ref 11.5–15.5)
WBC: 6.3 10*3/uL (ref 4.0–10.5)

## 2014-06-05 LAB — BASIC METABOLIC PANEL
Anion gap: 4 — ABNORMAL LOW (ref 5–15)
BUN: 16 mg/dL (ref 6–23)
CALCIUM: 10.2 mg/dL (ref 8.4–10.5)
CHLORIDE: 104 mmol/L (ref 96–112)
CO2: 30 mmol/L (ref 19–32)
CREATININE: 0.86 mg/dL (ref 0.50–1.10)
GFR calc non Af Amer: 63 mL/min — ABNORMAL LOW (ref >90)
GFR, EST AFRICAN AMERICAN: 73 mL/min — AB (ref >90)
GLUCOSE: 119 mg/dL — AB (ref 70–99)
Potassium: 4 mmol/L (ref 3.5–5.1)
Sodium: 138 mmol/L (ref 135–145)

## 2014-06-10 ENCOUNTER — Ambulatory Visit (HOSPITAL_COMMUNITY)
Admission: RE | Admit: 2014-06-10 | Discharge: 2014-06-10 | Disposition: A | Discharge status: Home / Self Care | Payer: Medicare Other | Source: Ambulatory Visit | Attending: General Surgery | Admitting: General Surgery

## 2014-06-10 ENCOUNTER — Ambulatory Visit (HOSPITAL_COMMUNITY): Payer: Medicare Other | Admitting: Anesthesiology

## 2014-06-10 ENCOUNTER — Encounter (HOSPITAL_COMMUNITY): Payer: Self-pay

## 2014-06-10 ENCOUNTER — Encounter (HOSPITAL_COMMUNITY)
Admission: RE | Disposition: A | Discharge status: Home / Self Care | Payer: Self-pay | Source: Ambulatory Visit | Attending: General Surgery

## 2014-06-10 ENCOUNTER — Ambulatory Visit (HOSPITAL_COMMUNITY): Payer: Medicare Other

## 2014-06-10 ENCOUNTER — Encounter (HOSPITAL_COMMUNITY): Payer: Self-pay | Admitting: *Deleted

## 2014-06-10 ENCOUNTER — Other Ambulatory Visit (HOSPITAL_COMMUNITY): Payer: Self-pay

## 2014-06-10 DIAGNOSIS — N62 Hypertrophy of breast: Secondary | ICD-10-CM | POA: Insufficient documentation

## 2014-06-10 DIAGNOSIS — C50911 Malignant neoplasm of unspecified site of right female breast: Secondary | ICD-10-CM

## 2014-06-10 DIAGNOSIS — N6011 Diffuse cystic mastopathy of right breast: Secondary | ICD-10-CM | POA: Insufficient documentation

## 2014-06-10 DIAGNOSIS — E780 Pure hypercholesterolemia: Secondary | ICD-10-CM | POA: Diagnosis not present

## 2014-06-10 DIAGNOSIS — R928 Other abnormal and inconclusive findings on diagnostic imaging of breast: Secondary | ICD-10-CM | POA: Diagnosis present

## 2014-06-10 DIAGNOSIS — I10 Essential (primary) hypertension: Secondary | ICD-10-CM | POA: Diagnosis not present

## 2014-06-10 DIAGNOSIS — IMO0002 Reserved for concepts with insufficient information to code with codable children: Secondary | ICD-10-CM

## 2014-06-10 DIAGNOSIS — R229 Localized swelling, mass and lump, unspecified: Secondary | ICD-10-CM

## 2014-06-10 DIAGNOSIS — D0511 Intraductal carcinoma in situ of right breast: Secondary | ICD-10-CM | POA: Insufficient documentation

## 2014-06-10 HISTORY — PX: BREAST BIOPSY: SHX20

## 2014-06-10 LAB — GLUCOSE, CAPILLARY
Glucose-Capillary: 153 mg/dL — ABNORMAL HIGH (ref 70–99)
Glucose-Capillary: 76 mg/dL (ref 70–99)
Glucose-Capillary: 131 mg/dL — ABNORMAL HIGH (ref 70–99)

## 2014-06-10 SURGERY — BREAST BIOPSY WITH NEEDLE LOCALIZATION
Anesthesia: General | Laterality: Right

## 2014-06-10 MED ORDER — CEFAZOLIN SODIUM-DEXTROSE 2-3 GM-% IV SOLR
INTRAVENOUS | Status: AC
  Filled 2014-06-10: qty 50

## 2014-06-10 MED ORDER — LACTATED RINGERS IV SOLN
INTRAVENOUS | Status: DC | PRN
  Administered 2014-06-10: 09:00:00 via INTRAVENOUS

## 2014-06-10 MED ORDER — DEXTROSE 50 % IV SOLN
INTRAVENOUS | Status: AC
  Filled 2014-06-10: qty 50

## 2014-06-10 MED ORDER — FENTANYL CITRATE 0.05 MG/ML IJ SOLN
INTRAMUSCULAR | Status: DC | PRN
  Administered 2014-06-10 (×3): 25 ug via INTRAVENOUS

## 2014-06-10 MED ORDER — EPHEDRINE SULFATE 50 MG/ML IJ SOLN
INTRAMUSCULAR | Status: DC | PRN
  Administered 2014-06-10: 10 mg via INTRAVENOUS
  Administered 2014-06-10 (×2): 5 mg via INTRAVENOUS

## 2014-06-10 MED ORDER — BUPIVACAINE HCL (PF) 0.5 % IJ SOLN
INTRAMUSCULAR | Status: AC
  Filled 2014-06-10: qty 30

## 2014-06-10 MED ORDER — DEXTROSE 50 % IV SOLN
25.0000 mL | Freq: Once | INTRAVENOUS | Status: AC
  Administered 2014-06-10: 25 mL via INTRAVENOUS

## 2014-06-10 MED ORDER — FENTANYL CITRATE 0.05 MG/ML IJ SOLN
25.0000 ug | INTRAMUSCULAR | Status: DC | PRN
  Administered 2014-06-10 (×2): 25 ug via INTRAVENOUS
  Administered 2014-06-10: 50 ug via INTRAVENOUS

## 2014-06-10 MED ORDER — FENTANYL CITRATE 0.05 MG/ML IJ SOLN
25.0000 ug | INTRAMUSCULAR | Status: AC
  Administered 2014-06-10 (×2): 25 ug via INTRAVENOUS

## 2014-06-10 MED ORDER — ONDANSETRON HCL 4 MG/2ML IJ SOLN
INTRAMUSCULAR | Status: AC
  Filled 2014-06-10: qty 2

## 2014-06-10 MED ORDER — CHLORHEXIDINE GLUCONATE 4 % EX LIQD: 1.0000 "application " | Freq: Once | CUTANEOUS | Status: DC

## 2014-06-10 MED ORDER — HYDROCODONE-ACETAMINOPHEN 5-325 MG PO TABS: 1.0000 | ORAL_TABLET | ORAL | Status: DC | PRN

## 2014-06-10 MED ORDER — PHENYLEPHRINE HCL 10 MG/ML IJ SOLN
INTRAMUSCULAR | Status: AC
  Filled 2014-06-10: qty 1

## 2014-06-10 MED ORDER — BUPIVACAINE HCL (PF) 0.5 % IJ SOLN
INTRAMUSCULAR | Status: DC | PRN
  Administered 2014-06-10: 5 mL

## 2014-06-10 MED ORDER — LIDOCAINE HCL (PF) 2 % IJ SOLN
INTRAMUSCULAR | Status: AC
  Filled 2014-06-10: qty 10

## 2014-06-10 MED ORDER — ONDANSETRON HCL 4 MG/2ML IJ SOLN: 4.0000 mg | Freq: Once | INTRAMUSCULAR | Status: DC | PRN

## 2014-06-10 MED ORDER — PROPOFOL 10 MG/ML IV BOLUS
INTRAVENOUS | Status: AC
  Filled 2014-06-10: qty 20

## 2014-06-10 MED ORDER — 0.9 % SODIUM CHLORIDE (POUR BTL) OPTIME
TOPICAL | Status: DC | PRN
  Administered 2014-06-10: 1000 mL

## 2014-06-10 MED ORDER — KETOROLAC TROMETHAMINE 30 MG/ML IJ SOLN
30.0000 mg | Freq: Once | INTRAMUSCULAR | Status: AC
  Administered 2014-06-10: 30 mg via INTRAVENOUS
  Filled 2014-06-10: qty 1

## 2014-06-10 MED ORDER — PROPOFOL 10 MG/ML IV BOLUS
INTRAVENOUS | Status: DC | PRN
  Administered 2014-06-10: 120 mg via INTRAVENOUS

## 2014-06-10 MED ORDER — CEFAZOLIN SODIUM-DEXTROSE 2-3 GM-% IV SOLR
2.0000 g | INTRAVENOUS | Status: AC
  Administered 2014-06-10: 2 g via INTRAVENOUS

## 2014-06-10 MED ORDER — FENTANYL CITRATE 0.05 MG/ML IJ SOLN
INTRAMUSCULAR | Status: AC
  Filled 2014-06-10: qty 5

## 2014-06-10 MED ORDER — MIDAZOLAM HCL 2 MG/2ML IJ SOLN
INTRAMUSCULAR | Status: AC
  Filled 2014-06-10: qty 2

## 2014-06-10 MED ORDER — LACTATED RINGERS IV SOLN
INTRAVENOUS | Status: DC
  Administered 2014-06-10: 1000 mL via INTRAVENOUS

## 2014-06-10 MED ORDER — ONDANSETRON HCL 4 MG/2ML IJ SOLN
4.0000 mg | Freq: Once | INTRAMUSCULAR | Status: AC
  Administered 2014-06-10: 4 mg via INTRAVENOUS

## 2014-06-10 MED ORDER — FENTANYL CITRATE 0.05 MG/ML IJ SOLN
INTRAMUSCULAR | Status: AC
  Filled 2014-06-10: qty 2

## 2014-06-10 MED ORDER — MIDAZOLAM HCL 2 MG/2ML IJ SOLN
1.0000 mg | INTRAMUSCULAR | Status: DC | PRN
Start: 2014-06-10 — End: 2014-06-10
  Administered 2014-06-10: 2 mg via INTRAVENOUS

## 2014-06-10 MED ORDER — LIDOCAINE HCL (PF) 1 % IJ SOLN
INTRAMUSCULAR | Status: AC
  Filled 2014-06-10: qty 5

## 2014-06-10 MED ORDER — LIDOCAINE HCL (CARDIAC) 10 MG/ML IV SOLN
INTRAVENOUS | Status: DC | PRN
  Administered 2014-06-10: 50 mg via INTRAVENOUS

## 2014-06-10 MED ORDER — SODIUM CHLORIDE 0.9 % IJ SOLN
INTRAMUSCULAR | Status: AC
  Filled 2014-06-10: qty 20

## 2014-06-10 SURGICAL SUPPLY — 32 items
BAG HAMPER (MISCELLANEOUS) ×2 IMPLANT
BLADE SURG 15 STRL LF DISP TIS (BLADE) ×1 IMPLANT
BLADE SURG 15 STRL SS (BLADE) ×2
CHLORAPREP W/TINT 26ML (MISCELLANEOUS) ×2 IMPLANT
CLOTH BEACON ORANGE TIMEOUT ST (SAFETY) ×2 IMPLANT
COVER LIGHT HANDLE STERIS (MISCELLANEOUS) ×4 IMPLANT
DECANTER SPIKE VIAL GLASS SM (MISCELLANEOUS) ×2 IMPLANT
ELECT REM PT RETURN 9FT ADLT (ELECTROSURGICAL) ×2
ELECTRODE REM PT RTRN 9FT ADLT (ELECTROSURGICAL) ×1 IMPLANT
FORMALIN 10 PREFIL 120ML (MISCELLANEOUS) ×2 IMPLANT
GLOVE BIO SURGEON STRL SZ7.5 (GLOVE) ×1 IMPLANT
GLOVE BIOGEL PI IND STRL 7.0 (GLOVE) IMPLANT
GLOVE BIOGEL PI IND STRL 7.5 (GLOVE) IMPLANT
GLOVE BIOGEL PI INDICATOR 7.0 (GLOVE) ×1
GLOVE BIOGEL PI INDICATOR 7.5 (GLOVE) ×1
GLOVE SURG SS PI 7.5 STRL IVOR (GLOVE) ×4 IMPLANT
GOWN STRL REUS W/TWL LRG LVL3 (GOWN DISPOSABLE) ×6 IMPLANT
KIT ROOM TURNOVER APOR (KITS) ×2 IMPLANT
LIQUID BAND (GAUZE/BANDAGES/DRESSINGS) ×2 IMPLANT
MANIFOLD NEPTUNE II (INSTRUMENTS) ×2 IMPLANT
NDL HYPO 18GX1.5 BLUNT FILL (NEEDLE) ×1 IMPLANT
NEEDLE HYPO 18GX1.5 BLUNT FILL (NEEDLE) ×2 IMPLANT
NEEDLE HYPO 25X1 1.5 SAFETY (NEEDLE) ×2 IMPLANT
NS IRRIG 1000ML POUR BTL (IV SOLUTION) ×2 IMPLANT
PACK MINOR (CUSTOM PROCEDURE TRAY) ×2 IMPLANT
PAD ARMBOARD 7.5X6 YLW CONV (MISCELLANEOUS) ×2 IMPLANT
SET BASIN LINEN APH (SET/KITS/TRAYS/PACK) ×2 IMPLANT
SUT SILK 2 0 SH (SUTURE) ×2 IMPLANT
SUT VIC AB 3-0 SH 27 (SUTURE) ×2
SUT VIC AB 3-0 SH 27X BRD (SUTURE) ×1 IMPLANT
SUT VIC AB 4-0 PS2 27 (SUTURE) ×2 IMPLANT
SYR CONTROL 10ML LL (SYRINGE) ×2 IMPLANT

## 2014-06-10 NOTE — Op Note (Signed)
Patient:  Melissa Deleon  DOB:  1934/01/31  MRN:  [id number]   Preop Diagnosis:  Right breast neoplasm, unspecified  Postop Diagnosis:  Same  Procedure:  Right breast biopsy after needle localization  Surgeon:  Aviva Signs, M.D.  Anes:  Gen.  Indications:  Patient is a 81 year old white female who presents with an abnormal mammogram of the right breast. She now presents for right breast biopsy after needle localization. Risks and benefits of the procedure including bleeding and infection were fully explained to the patient, who gave informed consent.  Procedure note:  The patient was placed the supine position. She had already undergone needle localization in radiology department. After general anesthesia was administered, the right breast was prepped and draped using usual sterile technique with ChloraPrep. Surgical site confirmation was performed.  The needle was present in the upper, outer quadrant of the right breast. An incision was made laterally in the right breast to include the needle. The guidewire was then followed down to the area of concern. This was removed without difficulty. A short suture was placed superiorly and a long suture placed laterally for orientation purposes. Specimen radiography revealed the suspicious lesion and clip to be within the specimen removed. It was sent to pathology for further examination. Any bleeding was controlled using Bovie electrocautery. 0.5% Sensorcaine was instilled the surrounding wound. The skin was closed using a 4-0 Vicryl subcuticular suture. Liquid barium was then applied.  All tape and needle counts were correct at the end of the procedure. Patient was awakened and transferred to PACU in stable condition.  Complications:  None  EBL:  Minimal  Specimen:  Right breast biopsy, short suture superior, long suture lateral

## 2014-06-10 NOTE — Discharge Instructions (Signed)
Breast Biopsy Care After These instructions give you information on caring for yourself after your procedure. Your doctor may also give you more specific instructions. Call your doctor if you have any problems or questions after your procedure. HOME CARE  Only take medicine as told by your doctor.  Do not take aspirin.  Keep your sutures (stitches) dry when bathing.  Protect the biopsy area. Do not let the area get bumped.  Avoid activities that could pull the biopsy site open until your doctor approves. This includes:  Stretching.  Reaching.  Exercise.  Sports.  Lifting more than 3lb.  Continue your normal diet.  Wear a good support bra for as long as told by your doctor.  Change any bandages (dressings) as told by your doctor.  Do not drink alcohol while taking pain medicine.  Keep all doctor visits as told. Ask when your test results will be ready. Make sure you get your test results. GET HELP RIGHT AWAY IF:   You have a fever.  You have more bleeding (more than a small spot) from the biopsy site.  You have trouble breathing.  You have yellowish-white fluid (pus) coming from the biopsy site.  You have redness, puffiness (swelling), or more pain in the biopsy site.  You have a bad smell coming from the biopsy site.  Your biopsy site opens after sutures, staples, or sticky strips have been removed.  You have a rash.  You need stronger medicine. MAKE SURE YOU:  Understand these instructions.  Will watch your condition.  Will get help right away if you are not doing well or get worse. Document Released: 01/28/2009 Document Revised: 06/26/2011 Document Reviewed: 05/14/2011 [facility] Patient Information 2015 [place], [place]. This information is not intended to replace advice given to you by your health care provider. Make sure you discuss any questions you have with your health care provider.

## 2014-06-10 NOTE — Anesthesia Preprocedure Evaluation (Signed)
Anesthesia Evaluation  Patient identified by MRN, date of birth, ID band Patient awake    Reviewed: Allergy & Precautions, H&P , NPO status , Patient's Chart, lab work & pertinent test results  Airway Mallampati: II  TM Distance: >3 FB Neck ROM: full    Dental  (+) Teeth Intact   Pulmonary neg pulmonary ROS,  breath sounds clear to auscultation        Cardiovascular hypertension, Pt. on medications Rhythm:regular Rate:Normal     Neuro/Psych    GI/Hepatic GERD-  Medicated,  Endo/Other    Renal/GU      Musculoskeletal   Abdominal   Peds  Hematology   Anesthesia Other Findings   Reproductive/Obstetrics                             Anesthesia Physical Anesthesia Plan  ASA: II  Anesthesia Plan: General   Post-op Pain Management:    Induction: Intravenous  Airway Management Planned: LMA  Additional Equipment:   Intra-op Plan:   Post-operative Plan: Extubation in OR  Informed Consent: I have reviewed the patients History and Physical, chart, labs and discussed the procedure including the risks, benefits and alternatives for the proposed anesthesia with the patient or authorized representative who has indicated his/her understanding and acceptance.     Plan Discussed with:   Anesthesia Plan Comments:         Anesthesia Quick Evaluation

## 2014-06-10 NOTE — Transfer of Care (Signed)
Immediate Anesthesia Transfer of Care Note  Patient: Melissa Deleon  Procedure(s) Performed: Procedure(s): RIGHT BREAST BIOPSY AFTER NEEDLE LOCALIZATION (Right)  Patient Location: PACU  Anesthesia Type:General  Level of Consciousness: awake, oriented and patient cooperative  Airway & Oxygen Therapy: Patient Spontanous Breathing and Patient connected to face mask oxygen  Post-op Assessment: Report given to RN and Post -op Vital signs reviewed and stable  Post vital signs: Reviewed and stable  Last Vitals:  Filed Vitals:   06/10/14 1005  BP: 113/65  Temp:   Resp: 18    Complications: No apparent anesthesia complications

## 2014-06-10 NOTE — Anesthesia Procedure Notes (Signed)
Procedure Name: LMA Insertion Date/Time: 06/10/2014 10:21 AM Performed by: Andree Elk, AMY A Pre-anesthesia Checklist: Patient identified, Timeout performed, Emergency Drugs available, Suction available and Patient being monitored Patient Re-evaluated:Patient Re-evaluated prior to inductionOxygen Delivery Method: Circle system utilized Preoxygenation: Pre-oxygenation with 100% oxygen Intubation Type: IV induction Ventilation: Mask ventilation without difficulty LMA: LMA inserted LMA Size: 4.0 Number of attempts: 1 Placement Confirmation: positive ETCO2 and breath sounds checked- equal and bilateral Tube secured with: Tape Dental Injury: Teeth and Oropharynx as per pre-operative assessment

## 2014-06-10 NOTE — Anesthesia Postprocedure Evaluation (Signed)
  Anesthesia Post-op Note Late entry Patient: Melissa Deleon  Procedure(s) Performed: Procedure(s): RIGHT BREAST BIOPSY AFTER NEEDLE LOCALIZATION (Right)  Patient Location: PACU  Anesthesia Type:General  Level of Consciousness: awake, alert , oriented and patient cooperative  Airway and Oxygen Therapy: Patient Spontanous Breathing  Post-op Pain: none  Post-op Assessment: Post-op Vital signs reviewed, Patient's Cardiovascular Status Stable, Respiratory Function Stable, Patent Airway, No signs of Nausea or vomiting and Pain level controlled  Post-op Vital Signs: Reviewed and stable  Last Vitals:  Filed Vitals:   06/10/14 1214  BP: 134/72  Pulse: 77  Temp: 36.7 C  Resp: 16    Complications: No apparent anesthesia complications

## 2014-06-10 NOTE — Progress Notes (Signed)
Pt. Taken to mammography for right breast needle localization.

## 2014-06-10 NOTE — Interval H&P Note (Signed)
History and Physical Interval Note:  06/10/2014 9:41 AM  Melissa Deleon  has presented today for surgery, with the diagnosis of right breast neoplasm unspecified  The various methods of treatment have been discussed with the patient and family. After consideration of risks, benefits and other options for treatment, the patient has consented to  Procedure(s): RIGHT BREAST BIOPSY AFTER NEEDLE LOCALIZATION (Right) as a surgical intervention .  The patient's history has been reviewed, patient examined, no change in status, stable for surgery.  I have reviewed the patient's chart and labs.  Questions were answered to the patient's satisfaction.     Aviva Signs A

## 2014-06-11 ENCOUNTER — Encounter (HOSPITAL_COMMUNITY): Payer: Self-pay | Admitting: General Surgery

## 2014-06-16 HISTORY — PX: BREAST LUMPECTOMY: SHX2

## 2014-06-19 NOTE — Telephone Encounter (Signed)
Detrol e-scribed.

## 2014-06-24 ENCOUNTER — Encounter (HOSPITAL_COMMUNITY): Payer: Medicare Other | Attending: Hematology & Oncology | Admitting: Hematology & Oncology

## 2014-06-24 ENCOUNTER — Encounter (HOSPITAL_COMMUNITY): Payer: Self-pay | Admitting: Hematology & Oncology

## 2014-06-24 VITALS — BP 136/68 | HR 75 | Temp 98.9°F | Resp 18 | Wt 159.1 lb

## 2014-06-24 DIAGNOSIS — N6099 Unspecified benign mammary dysplasia of unspecified breast: Secondary | ICD-10-CM

## 2014-06-24 DIAGNOSIS — M8580 Other specified disorders of bone density and structure, unspecified site: Secondary | ICD-10-CM

## 2014-06-24 DIAGNOSIS — D0511 Intraductal carcinoma in situ of right breast: Secondary | ICD-10-CM

## 2014-06-24 DIAGNOSIS — M2550 Pain in unspecified joint: Secondary | ICD-10-CM

## 2014-06-24 DIAGNOSIS — N62 Hypertrophy of breast: Secondary | ICD-10-CM | POA: Diagnosis not present

## 2014-06-24 NOTE — Progress Notes (Signed)
[place] CONSULT NOTE  Patient Care Team: Redmond School, MD as PCP - General (Internal Medicine)  CHIEF COMPLAINTS/PURPOSE OF CONSULTATION:  Abnormal  Mammogram of the R breast R breast biopsy after needle localization with Dr.Jenkins on 06/10/2014 DCIS ER+ (100%), PR- (0%), intermediate grade 0.2cm, focal atypical ductal hyperplasia pTis Nx  HISTORY OF PRESENTING ILLNESS:  Melissa Deleon 81 y.o. female is here because of newly diagnosed DCIS of the R breast. She went for routine screening mammogram in November 2015 that showed abnormal calcifications in the right breast. She was ultimately referred to Dr. Arnoldo Morale and has since undergone definitive surgical resection with good margins. She is here today for additional discussion of needed treatment options.  She is very independent, she lives alone. She has a large family. She has a good social support system. She enjoys yardwork and mows her grass. She enjoys cooking and gardening. She feels she has minimal physical limitations.  She has no significant postoperative complaints.  MEDICAL HISTORY:  Past Medical History  Diagnosis Date  . Hypertension   . GERD (gastroesophageal reflux disease)     SURGICAL HISTORY: Past Surgical History  Procedure Laterality Date  . No past surgeries      RING PLACED TO HOLD BLADDER   . Lumbar laminectomy/decompression microdiscectomy  03/07/2012    Procedure: LUMBAR LAMINECTOMY/DECOMPRESSION MICRODISCECTOMY 1 LEVEL;  Surgeon: Otilio Connors, MD;  Location: [place] NEURO ORS;  Service: Neurosurgery;  Laterality: Left;  Left Lumbar four-five Laminectomy for Synovial cyst  . Bladder ring    . Back surgery    . Kyphoplasty N/A 06/11/2012    Procedure: KYPHOPLASTY (C-Arm x 2);  Surgeon: Otilio Connors, MD;  Location: [facility] NEURO ORS;  Service: Neurosurgery;  Laterality: N/A;  Lumbar two Kyphoplasty  . Breast biopsy Right 06/10/2014    Procedure: RIGHT BREAST BIOPSY AFTER NEEDLE LOCALIZATION;   Surgeon: Jamesetta So, MD;  Location: [facility];  Service: General;  Laterality: Right;    SOCIAL HISTORY: History   Social History  . Marital Status: Widowed    Spouse Name: N/A  . Number of Children: N/A  . Years of Education: N/A   Occupational History  . Not on file.   Social History Main Topics  . Smoking status: Never Smoker   . Smokeless tobacco: Never Used  . Alcohol Use: No  . Drug Use: No  . Sexual Activity: Not Currently    Birth Control/ Protection: Post-menopausal   Other Topics Concern  . Not on file   Social History Narrative   she does not smoke, is a never smoker. She is very active. She is widowed. She has an excellent support system in her family and friends.  FAMILY HISTORY: Family History  Problem Relation Age of Onset  . Diabetes Mother   . Heart attack Father   . Cancer Sister     breast   indicated that her mother is deceased. She indicated that her father is deceased. She indicated that her sister is deceased. She indicated that her brother is alive.     ALLERGIES:  is allergic to sulfa antibiotics.  MEDICATIONS:  Current Outpatient Prescriptions  Medication Sig Dispense Refill  . calcium carbonate (OS-CAL) 600 MG TABS tablet Take 600 mg by mouth 2 (two) times daily with a meal.    . HYDROcodone-acetaminophen (NORCO/VICODIN) 5-325 MG per tablet Take 1-2 tablets by mouth every 4 (four) hours as needed. 40 tablet 0  . lisinopril-hydrochlorothiazide (PRINZIDE,ZESTORETIC) 20-25 MG  per tablet Take 1 tablet by mouth daily.    Marland Kitchen omeprazole (PRILOSEC) 10 MG capsule Take 10 mg by mouth daily as needed.     Marland Kitchen oxybutynin (DITROPAN-XL) 10 MG 24 hr tablet TAKE ONE TABLET BY MOUTH EVERY NIGHT AT BEDTIME 30 tablet 12  . pravastatin (PRAVACHOL) 10 MG tablet Take 1 tablet by mouth daily.  1  . tolterodine (DETROL LA) 4 MG 24 hr capsule Take 1 tablet at bedtime 30 capsule 11   No current facility-administered medications for this visit.    Review of  Systems  Constitutional: Negative for fever, chills, weight loss and malaise/fatigue.  HENT: Negative for congestion, hearing loss, nosebleeds, sore throat and tinnitus.   Eyes: Negative for blurred vision, double vision, pain and discharge.  Respiratory: Negative for cough, hemoptysis, sputum production, shortness of breath and wheezing.   Cardiovascular: Negative for chest pain, palpitations, claudication, leg swelling and PND.  Gastrointestinal: Negative for heartburn, nausea, vomiting, abdominal pain, diarrhea, constipation, blood in stool and melena.  Genitourinary: Negative for dysuria, urgency, frequency and hematuria.  Musculoskeletal: Positive for joint pain. Negative for myalgias and falls.  Skin: Negative for itching and rash.  Neurological: Negative for dizziness, tingling, tremors, sensory change, speech change, focal weakness, seizures, loss of consciousness, weakness and headaches.  Endo/Heme/Allergies: Does not bruise/bleed easily.  Psychiatric/Behavioral: Negative for depression, suicidal ideas, memory loss and substance abuse. The patient is not nervous/anxious and does not have insomnia.     PHYSICAL EXAMINATION: ECOG PERFORMANCE STATUS: 0 - Asymptomatic  There were no vitals filed for this visit. There were no vitals filed for this visit.   Physical Exam  Constitutional: She is oriented to person, place, and time and well-developed, well-nourished, and in no distress.  HENT:  Head: Normocephalic and atraumatic.  Nose: Nose normal.  Mouth/Throat: Oropharynx is clear and moist. No oropharyngeal exudate.  Eyes: Conjunctivae and EOM are normal. Pupils are equal, round, and reactive to light. Right eye exhibits no discharge. Left eye exhibits no discharge. No scleral icterus.  Neck: Normal range of motion. Neck supple. No tracheal deviation present. No thyromegaly present.  Cardiovascular: Normal rate, regular rhythm and normal heart sounds.  Exam reveals no gallop and no  friction rub.   No murmur heard. Pulmonary/Chest: Effort normal and breath sounds normal. She has no wheezes. She has no rales.  Abdominal: Soft. Bowel sounds are normal. She exhibits no distension and no mass. There is no tenderness. There is no rebound and no guarding.  Musculoskeletal: Normal range of motion. She exhibits no edema.  Lymphadenopathy:    She has no cervical adenopathy.  Neurological: She is alert and oriented to person, place, and time. She has normal reflexes. No cranial nerve deficit. Gait normal. Coordination normal.  Skin: Skin is warm and dry. No rash noted.  Psychiatric: Mood, memory, affect and judgment normal.  Nursing note and vitals reviewed.    LABORATORY DATA:  I have reviewed the data as listed Lab Results  Component Value Date   WBC 6.3 06/05/2014   HGB 12.7 06/05/2014   HCT 39.7 06/05/2014   MCV 91.1 06/05/2014   PLT 302 06/05/2014     Chemistry      Component Value Date/Time   NA 138 06/05/2014 1355   K 4.0 06/05/2014 1355   CL 104 06/05/2014 1355   CO2 30 06/05/2014 1355   BUN 16 06/05/2014 1355   CREATININE 0.86 06/05/2014 1355      Component Value Date/Time   CALCIUM  10.2 06/05/2014 1355   ALKPHOS 91 04/19/2012 0624   AST 20 04/19/2012 0624   ALT 14 04/19/2012 0624   BILITOT 0.5 04/19/2012 0624       RADIOGRAPHIC STUDIES:  I have personally reviewed the radiological images as listed and agreed with the findings in the report.  CLINICAL DATA: Screening.  EXAM: DIGITAL SCREENING BILATERAL MAMMOGRAM WITH CAD  COMPARISON: Previous Exam(s)    BI-RADS CATEGORY 0: Incomplete. Need additional imaging evaluation and/or prior mammograms for comparison.   Electronically Signed  By: Shon Hale M.D.  On: 03/02/2014 16:14   CLINICAL DATA: Right breast calcifications at recent screening mammography.  EXAM: DIGITAL DIAGNOSTIC RIGHT MAMMOGRAM  COMPARISON: Previous examinations, including the screening mammogram  dated 03/02/2014.  RECOMMENDATION: Right breast stereotactic guided core needle biopsy. This has been scheduled for 9:45 a.m. on 04/01/2014 at the [facility] [place].  I have discussed the findings and recommendations with the patient. Results were also provided in writing at the conclusion of the visit. If applicable, a reminder letter will be sent to the patient regarding the next appointment.  BI-RADS CATEGORY 4: Suspicious.   Electronically Signed  By: Enrique Sack M.D.  On: 03/17/2014 17:25  ASSESSMENT & PLAN:   DCIS R breast ER+ PR-, focal atypical ductal hyperplasia  81 year old female with excellent performance status and newly diagnosed DCIS of the right breast. Tumor is estrogen receptor positive and progesterone receptor negative. Total tumor dimension was 0.2 cm. There was evidence of atypical ductal hyperplasia. She has done very well with surgical resection. We spent time today discussing the nature of DCIS. I explained the difference between invasive versus noninvasive breast disease. She has a very good understanding of her malignancy. We discussed the potential role for radiation and I strongly recommended that she consider seeing a radiation oncologist for a consultation and to discuss options and recommendations.  We discussed the role of endocrine therapy principally as a means of chemoprevention. We discussed the use of both tamoxifen and aromatase inhibitors. After our discussion today she will proceed with a radiation oncology consultation. She will return to meet with me in 2-3 weeks at which time we will outline further the role of endocrine therapy. We will also discuss the risks and benefits of that therapy at her follow-up. She was provided with reading information in regards to DCIS.  Osteopenia on DEXA 2013  Her last DEXA showed osteopenia, tamoxifen may be the best option for her but we will consider this moving forward. She has not  had a hysterectomy. She does currently take calcium and vitamin D.    All questions were answered. The patient knows to call the clinic with any problems, questions or concerns.  This note was electronically signed.    Molli Hazard, MD MD 06/24/2014 8:02 AM

## 2014-06-24 NOTE — Patient Instructions (Signed)
..  [place] at [facility] Discharge Instructions  RECOMMENDATIONS MADE BY THE CONSULTANT AND ANY TEST RESULTS WILL BE SENT TO YOUR REFERRING PHYSICIAN.  Exam per Dr. Whitney Muse today  Referral to Radiation Dr.  Return in 3 weeks    Thank you for choosing [place] at [facility] to provide your oncology and hematology care.  To afford each patient quality time with our provider, please arrive at least 15 minutes before your scheduled appointment time.    You need to re-schedule your appointment should you arrive 10 or more minutes late.  We strive to give you quality time with our providers, and arriving late affects you and other patients whose appointments are after yours.  Also, if you no show three or more times for appointments you may be dismissed from the clinic at the providers discretion.     Again, thank you for choosing [facility].  Our hope is that these requests will decrease the amount of time that you wait before being seen by our physicians.       _____________________________________________________________  Should you have questions after your visit to [facility], please contact our office at [phone] between the hours of 8:30 a.m. and 4:30 p.m.  Voicemails left after 4:30 p.m. will not be returned until the following business day.  For prescription refill requests, have your pharmacy contact our office.

## 2014-06-25 ENCOUNTER — Encounter (HOSPITAL_COMMUNITY): Payer: Self-pay | Admitting: Lab

## 2014-06-25 NOTE — Progress Notes (Signed)
Referral sent to Rad Onc.  Melissa Deleon.  Records faxed on 3/10

## 2014-06-29 ENCOUNTER — Encounter: Payer: Self-pay | Admitting: Obstetrics & Gynecology

## 2014-06-29 ENCOUNTER — Ambulatory Visit (INDEPENDENT_AMBULATORY_CARE_PROVIDER_SITE_OTHER): Payer: Medicare Other | Admitting: Obstetrics & Gynecology

## 2014-06-29 VITALS — BP 140/80 | Wt 162.0 lb

## 2014-06-29 DIAGNOSIS — N813 Complete uterovaginal prolapse: Secondary | ICD-10-CM | POA: Diagnosis not present

## 2014-06-29 NOTE — Progress Notes (Signed)
Patient ID: Melissa Deleon, female   DOB: Aug 23, 1933, 81 y.o.   MRN: [id number]      KIA VARNADORE presents today for routine follow up related to her pessary.   She uses a Milex ring with support #2 She reports no vaginal discharge or vaginal bleeding.  Exam reveals no undue vaginal mucosal pressure of breakdown, no discharge and no vaginal bleeding.  The pessary is removed, cleaned and replaced without difficulty.    [name] will be Deleon back in 3 months for continued follow up.  Ardelle Haliburton H 06/29/2014 3:11 PM

## 2014-06-30 ENCOUNTER — Ambulatory Visit: Payer: Medicare Other | Admitting: Obstetrics & Gynecology

## 2014-07-15 ENCOUNTER — Encounter (HOSPITAL_BASED_OUTPATIENT_CLINIC_OR_DEPARTMENT_OTHER): Payer: Medicare Other | Admitting: Hematology & Oncology

## 2014-07-15 ENCOUNTER — Encounter (HOSPITAL_COMMUNITY): Payer: Self-pay | Admitting: Hematology & Oncology

## 2014-07-15 VITALS — BP 128/80 | HR 87 | Temp 99.0°F | Resp 16 | Wt 159.3 lb

## 2014-07-15 DIAGNOSIS — D0511 Intraductal carcinoma in situ of right breast: Secondary | ICD-10-CM

## 2014-07-15 DIAGNOSIS — M8580 Other specified disorders of bone density and structure, unspecified site: Secondary | ICD-10-CM

## 2014-07-15 DIAGNOSIS — C50919 Malignant neoplasm of unspecified site of unspecified female breast: Secondary | ICD-10-CM

## 2014-07-15 MED ORDER — TAMOXIFEN CITRATE 20 MG PO TABS: 20.0000 mg | ORAL_TABLET | Freq: Every day | ORAL | Status: DC

## 2014-07-15 NOTE — Progress Notes (Deleted)
[place] CONSULT NOTE  Patient Care Team: Redmond School, MD as PCP - General (Internal Medicine)  CHIEF COMPLAINTS/PURPOSE OF CONSULTATION:  ***  HISTORY OF PRESENTING ILLNESS:  Melissa Deleon 81 y.o. female is here because of ***   No history exists.     MEDICAL HISTORY:  Past Medical History  Diagnosis Date  . Hypertension   . GERD (gastroesophageal reflux disease)   . Breast cancer     SURGICAL HISTORY: Past Surgical History  Procedure Laterality Date  . No past surgeries      RING PLACED TO HOLD BLADDER   . Lumbar laminectomy/decompression microdiscectomy  03/07/2012    Procedure: LUMBAR LAMINECTOMY/DECOMPRESSION MICRODISCECTOMY 1 LEVEL;  Surgeon: Otilio Connors, MD;  Location: [place] NEURO ORS;  Service: Neurosurgery;  Laterality: Left;  Left Lumbar four-five Laminectomy for Synovial cyst  . Bladder ring    . Back surgery    . Kyphoplasty N/A 06/11/2012    Procedure: KYPHOPLASTY (C-Arm x 2);  Surgeon: Otilio Connors, MD;  Location: [facility] NEURO ORS;  Service: Neurosurgery;  Laterality: N/A;  Lumbar two Kyphoplasty  . Breast biopsy Right 06/10/2014    Procedure: RIGHT BREAST BIOPSY AFTER NEEDLE LOCALIZATION;  Surgeon: Jamesetta So, MD;  Location: [facility] ORS;  Service: General;  Laterality: Right;  . Breast lumpectomy Right 06/2014    SOCIAL HISTORY: History   Social History  . Marital Status: Widowed    Spouse Name: N/A  . Number of Children: N/A  . Years of Education: N/A   Occupational History  . Not on file.   Social History Main Topics  . Smoking status: Never Smoker   . Smokeless tobacco: Never Used  . Alcohol Use: No  . Drug Use: No  . Sexual Activity: Not Currently    Birth Control/ Protection: Post-menopausal   Other Topics Concern  . Not on file   Social History Narrative    FAMILY HISTORY: Family History  Problem Relation Age of Onset  . Diabetes Mother   . Heart attack Father   . Cancer Sister     breast   indicated that  her mother is deceased. She indicated that her father is deceased. She indicated that her sister is deceased. She indicated that her brother is alive.   ALLERGIES:  is allergic to sulfa antibiotics.  MEDICATIONS:  Current Outpatient Prescriptions  Medication Sig Dispense Refill  . calcium carbonate (OS-CAL) 600 MG TABS tablet Take 600 mg by mouth 2 (two) times daily with a meal.    . HYDROcodone-acetaminophen (NORCO/VICODIN) 5-325 MG per tablet Take 1-2 tablets by mouth every 4 (four) hours as needed. (Patient not taking: Reported on 06/24/2014) 40 tablet 0  . lisinopril-hydrochlorothiazide (PRINZIDE,ZESTORETIC) 20-25 MG per tablet Take 1 tablet by mouth daily.    Marland Kitchen omeprazole (PRILOSEC) 10 MG capsule Take 10 mg by mouth daily as needed.     Marland Kitchen oxybutynin (DITROPAN-XL) 10 MG 24 hr tablet TAKE ONE TABLET BY MOUTH EVERY NIGHT AT BEDTIME (Patient not taking: Reported on 06/24/2014) 30 tablet 12  . pravastatin (PRAVACHOL) 10 MG tablet Take 1 tablet by mouth daily.  1  . tolterodine (DETROL LA) 4 MG 24 hr capsule Take 1 tablet at bedtime (Patient not taking: Reported on 06/29/2014) 30 capsule 11   No current facility-administered medications for this visit.    ROS  PHYSICAL EXAMINATION: ECOG PERFORMANCE STATUS: {CHL ONC ECOG PS:[id number]}  There were no vitals filed for this visit. There were no vitals  filed for this visit.   Physical Exam   LABORATORY DATA:  I have reviewed the data as listed Lab Results  Component Value Date   WBC 6.3 06/05/2014   HGB 12.7 06/05/2014   HCT 39.7 06/05/2014   MCV 91.1 06/05/2014   PLT 302 06/05/2014     Chemistry      Component Value Date/Time   NA 138 06/05/2014 1355   K 4.0 06/05/2014 1355   CL 104 06/05/2014 1355   CO2 30 06/05/2014 1355   BUN 16 06/05/2014 1355   CREATININE 0.86 06/05/2014 1355      Component Value Date/Time   CALCIUM 10.2 06/05/2014 1355   ALKPHOS 91 04/19/2012 0624   AST 20 04/19/2012 0624   ALT 14 04/19/2012 0624    BILITOT 0.5 04/19/2012 0624       RADIOGRAPHIC STUDIES: I have personally reviewed the radiological images as listed and agreed with the findings in the report. No results found.  ASSESSMENT & PLAN:  No problem-specific assessment & plan notes found for this encounter.  No orders of the defined types were placed in this encounter.    All questions were answered. The patient knows to call the clinic with any problems, questions or concerns.  I spent {CHL ONC TIME VISIT - OILNZ:[id number]} counseling the patient face to face. The total time spent in the appointment was {CHL ONC TIME VISIT - IFBPP:[id number]} and more than 50% was on counseling.  This note was electronically signed.    Molli Hazard, MD MD 07/15/2014 9:19 AM

## 2014-07-15 NOTE — Patient Instructions (Signed)
[place] at [facility] Discharge Instructions  RECOMMENDATIONS MADE BY THE CONSULTANT AND ANY TEST RESULTS WILL BE SENT TO YOUR REFERRING PHYSICIAN.  Exam and discussion by Dr. Whitney Muse Tamoxifen - take as directed. Report any new lumps, bone pain, shortness of breath or other symptoms.  Labs and office visit in 1 month.   Thank you for choosing [place] at [facility] to provide your oncology and hematology care.  To afford each patient quality time with our provider, please arrive at least 15 minutes before your scheduled appointment time.    You need to re-schedule your appointment should you arrive 10 or more minutes late.  We strive to give you quality time with our providers, and arriving late affects you and other patients whose appointments are after yours.  Also, if you no show three or more times for appointments you may be dismissed from the clinic at the providers discretion.     Again, thank you for choosing [facility]  Our hope is that these requests will decrease the amount of time that you wait before being seen by our physicians.       _____________________________________________________________  Should you have questions after your visit to [facility], please contact our office at [phone] between the hours of 8:30 a.m. and 4:30 p.m.  Voicemails left after 4:30 p.m. will not be returned until the following business day.  For prescription refill requests, have your pharmacy contact our office.    Tamoxifen oral tablet What is this medicine? TAMOXIFEN (ta MOX i fen) blocks the effects of estrogen. It is commonly used to treat breast cancer. It is also used to decrease the chance of breast cancer coming back in women who have received treatment for the disease. It may also help prevent breast cancer in women who have a high risk of developing breast cancer. This medicine may be used for other  purposes; ask your health care provider or pharmacist if you have questions. COMMON BRAND NAME(S): Nolvadex What should I tell my health care provider before I take this medicine? They need to know if you have any of these conditions: -blood clots -blood disease -cataracts or impaired eyesight -endometriosis -high calcium levels -high cholesterol -irregular menstrual cycles -liver disease -stroke -uterine fibroids -an unusual or allergic reaction to tamoxifen, other medicines, foods, dyes, or preservatives -pregnant or trying to get pregnant -breast-feeding How should I use this medicine? Take this medicine by mouth with a glass of water. Follow the directions on the prescription label. You can take it with or without food. Take your medicine at regular intervals. Do not take your medicine more often than directed. Do not stop taking except on your doctor's advice. A special MedGuide will be given to you by the pharmacist with each prescription and refill. Be sure to read this information carefully each time. Talk to your pediatrician regarding the use of this medicine in children. While this drug may be prescribed for selected conditions, precautions do apply. Overdosage: If you think you have taken too much of this medicine contact a poison control center or emergency room at once. NOTE: This medicine is only for you. Do not share this medicine with others. What if I miss a dose? If you miss a dose, take it as soon as you can. If it is almost time for your next dose, take only that dose. Do not take double or extra doses. What may  interact with this medicine? -aminoglutethimide -bromocriptine -chemotherapy drugs -female hormones, like estrogens and birth control pills -letrozole -medroxyprogesterone -phenobarbital -rifampin -warfarin This list may not describe all possible interactions. Give your health care provider a list of all the medicines, herbs, non-prescription drugs, or  dietary supplements you use. Also tell them if you smoke, drink alcohol, or use illegal drugs. Some items may interact with your medicine. What should I watch for while using this medicine? Visit your doctor or health care professional for regular checks on your progress. You will need regular pelvic exams, breast exams, and mammograms. If you are taking this medicine to reduce your risk of getting breast cancer, you should know that this medicine does not prevent all types of breast cancer. If breast cancer or other problems occur, there is no guarantee that it will be found at an early stage. Do not become pregnant while taking this medicine or for 2 months after stopping this medicine. Stop taking this medicine if you get pregnant or think you are pregnant and contact your doctor. This medicine may harm your unborn baby. Women who can possibly become pregnant should use birth control methods that do not use hormones during tamoxifen treatment and for 2 months after therapy has stopped. Talk with your health care provider for birth control advice. Do not breast feed while taking this medicine. What side effects may I notice from receiving this medicine? Side effects that you should report to your doctor or health care professional as soon as possible: -changes in vision (blurred vision) -changes in your menstrual cycle -difficulty breathing or shortness of breath -difficulty walking or talking -new breast lumps -numbness -pelvic pain or pressure -redness, blistering, peeling or loosening of the skin, including inside the mouth -skin rash or itching (hives) -sudden chest pain -swelling of lips, face, or tongue -swelling, pain or tenderness in your calf or leg -unusual bruising or bleeding -vaginal discharge that is bloody, brown, or rust -weakness -yellowing of the whites of the eyes or skin Side effects that usually do not require medical attention (report to your doctor or health care  professional if they continue or are bothersome): -fatigue -hair loss, although uncommon and is usually mild -headache -hot flashes -impotence (in men) -nausea, vomiting (mild) -vaginal discharge (white or clear) This list may not describe all possible side effects. Call your doctor for medical advice about side effects. You may report side effects to FDA at 1-800-FDA-1088. Where should I keep my medicine? Keep out of the reach of children. Store at room temperature between 20 and 25 degrees C (68 and 77 degrees F). Protect from light. Keep container tightly closed. Throw away any unused medicine after the expiration date. NOTE: This sheet is a summary. It may not cover all possible information. If you have questions about this medicine, talk to your doctor, pharmacist, or health care provider.  2015, [employer]. (2007-12-19 12:01:56)

## 2014-08-12 NOTE — Progress Notes (Signed)
.   [place] Progress Note  Patient Care Team: [employer], MD as PCP - General (Internal Medicine)  CHIEF COMPLAINTS/PURPOSE OF CONSULTATION:  Abnormal  Mammogram of the R breast R breast biopsy after needle localization with Dr.Jenkins on 06/10/2014 DCIS ER+ (100%), PR- (0%), intermediate grade 0.2cm, focal atypical ductal hyperplasia pTis Nx Osteopenia on DEXA dated 10/06/2011  HISTORY OF PRESENTING ILLNESS:  Melissa Deleon 81 y.o. female is here because of newly diagnosed DCIS of the R breast. She went for routine screening mammogram in November 2015 that showed abnormal calcifications in the right breast. She was ultimately referred to Dr. Arnoldo Morale and has since undergone definitive surgical resection with good margins.   She is very independent, she lives alone. She has a large family. She has a good social support system. She enjoys yardwork and mows her grass. She enjoys cooking and gardening. She feels she has minimal physical limitations.  She has no significant postoperative complaints.  Radiation oncology and has opted not to pursue radiation therapy. In taking endocrine therapy and is here today to discuss treatment.  MEDICAL HISTORY:  Past Medical History  Diagnosis Date  . Hypertension   . GERD (gastroesophageal reflux disease)   . Breast cancer     SURGICAL HISTORY: Past Surgical History  Procedure Laterality Date  . No past surgeries      RING PLACED TO HOLD BLADDER   . Lumbar laminectomy/decompression microdiscectomy  03/07/2012    Procedure: LUMBAR LAMINECTOMY/DECOMPRESSION MICRODISCECTOMY 1 LEVEL;  Surgeon: Otilio Connors, MD;  Location: [place] [facility];  Service: Neurosurgery;  Laterality: Left;  Left Lumbar four-five Laminectomy for Synovial cyst  . Bladder ring    . Back surgery    . Kyphoplasty N/A 06/11/2012    Procedure: KYPHOPLASTY (C-Arm x 2);  Surgeon: Otilio Connors, MD;  Location: [facility];  Service: Neurosurgery;  Laterality:  N/A;  Lumbar two Kyphoplasty  . Breast biopsy Right 06/10/2014    Procedure: RIGHT BREAST BIOPSY AFTER NEEDLE LOCALIZATION;  Surgeon: Jamesetta So, MD;  Location: [facility];  Service: General;  Laterality: Right;  . Breast lumpectomy Right 06/2014    SOCIAL HISTORY: History   Social History  . Marital Status: Widowed    Spouse Name: N/A  . Number of Children: N/A  . Years of Education: N/A   Occupational History  . Not on file.   Social History Main Topics  . Smoking status: Never Smoker   . Smokeless tobacco: Never Used  . Alcohol Use: No  . Drug Use: No  . Sexual Activity: Not Currently    Birth Control/ Protection: Post-menopausal   Other Topics Concern  . Not on file   Social History Narrative   she does not smoke, is a never smoker. She is very active. She is widowed. She has an excellent support system in her family and friends.  FAMILY HISTORY: Family History  Problem Relation Age of Onset  . Diabetes Mother   . Heart attack Father   . Cancer Sister     breast   indicated that her mother is deceased. She indicated that her father is deceased. She indicated that her sister is deceased. She indicated that her brother is alive.     ALLERGIES:  is allergic to sulfa antibiotics.  MEDICATIONS:  Current Outpatient Prescriptions  Medication Sig Dispense Refill  . Calcium Carb-Cholecalciferol 600-200 MG-UNIT TABS Take 1 tablet by mouth daily.    Marland Kitchen HYDROcodone-acetaminophen (NORCO/VICODIN) 5-325 MG  per tablet Take 1-2 tablets by mouth every 4 (four) hours as needed. 40 tablet 0  . lisinopril-hydrochlorothiazide (PRINZIDE,ZESTORETIC) 20-25 MG per tablet Take 1 tablet by mouth daily.    Marland Kitchen omeprazole (PRILOSEC) 10 MG capsule Take 10 mg by mouth daily as needed.     . tolterodine (DETROL LA) 4 MG 24 hr capsule Take 1 tablet at bedtime 30 capsule 11  . pravastatin (PRAVACHOL) 10 MG tablet Take 1 tablet by mouth daily.  1  . tamoxifen (NOLVADEX) 20 MG tablet Take 1 tablet  (20 mg total) by mouth daily. 30 tablet 2   No current facility-administered medications for this visit.    Review of Systems  Constitutional: Negative for fever, chills, weight loss and malaise/fatigue.  HENT: Negative for congestion, hearing loss, nosebleeds, sore throat and tinnitus.   Eyes: Negative for blurred vision, double vision, pain and discharge.  Respiratory: Negative for cough, hemoptysis, sputum production, shortness of breath and wheezing.   Cardiovascular: Negative for chest pain, palpitations, claudication, leg swelling and PND.  Gastrointestinal: Negative for heartburn, nausea, vomiting, abdominal pain, diarrhea, constipation, blood in stool and melena.  Genitourinary: Negative for dysuria, urgency, frequency and hematuria.  Musculoskeletal: Negative for myalgias, joint pain and falls.  Skin: Negative for itching and rash.  Neurological: Negative for dizziness, tingling, tremors, sensory change, speech change, focal weakness, seizures, loss of consciousness, weakness and headaches.  Endo/Heme/Allergies: Does not bruise/bleed easily.  Psychiatric/Behavioral: Negative for depression, suicidal ideas, memory loss and substance abuse. The patient is not nervous/anxious and does not have insomnia.     PHYSICAL EXAMINATION: ECOG PERFORMANCE STATUS: 0 - Asymptomatic  Filed Vitals:   07/15/14 1121  BP: 128/80  Pulse: 87  Temp: 99 F (37.2 C)  Resp: 16   Filed Weights   07/15/14 1121  Weight: 159 lb 4.8 oz (72.258 kg)     Physical Exam  Constitutional: She is oriented to person, place, and time and well-developed, well-nourished, and in no distress.  HENT:  Head: Normocephalic and atraumatic.  Nose: Nose normal.  Mouth/Throat: Oropharynx is clear and moist. No oropharyngeal exudate.  Eyes: Conjunctivae and EOM are normal. Pupils are equal, round, and reactive to light. Right eye exhibits no discharge. Left eye exhibits no discharge. No scleral icterus.  Neck:  Normal range of motion. Neck supple. No tracheal deviation present. No thyromegaly present.  Cardiovascular: Normal rate, regular rhythm and normal heart sounds.  Exam reveals no gallop and no friction rub.   No murmur heard. Pulmonary/Chest: Effort normal and breath sounds normal. She has no wheezes. She has no rales.  Abdominal: Soft. Bowel sounds are normal. She exhibits no distension and no mass. There is no tenderness. There is no rebound and no guarding.  Musculoskeletal: Normal range of motion. She exhibits no edema.  Lymphadenopathy:    She has no cervical adenopathy.  Neurological: She is alert and oriented to person, place, and time. She has normal reflexes. No cranial nerve deficit. Gait normal. Coordination normal.  Skin: Skin is warm and dry. No rash noted.  Psychiatric: Mood, memory, affect and judgment normal.  Nursing note and vitals reviewed.    LABORATORY DATA:  I have reviewed the data as listed Lab Results  Component Value Date   WBC 6.3 06/05/2014   HGB 12.7 06/05/2014   HCT 39.7 06/05/2014   MCV 91.1 06/05/2014   PLT 302 06/05/2014     Chemistry      Component Value Date/Time   NA 138 06/05/2014 1355  K 4.0 06/05/2014 1355   CL 104 06/05/2014 1355   CO2 30 06/05/2014 1355   BUN 16 06/05/2014 1355   CREATININE 0.86 06/05/2014 1355      Component Value Date/Time   CALCIUM 10.2 06/05/2014 1355   ALKPHOS 91 04/19/2012 0624   AST 20 04/19/2012 0624   ALT 14 04/19/2012 0624   BILITOT 0.5 04/19/2012 0624       RADIOGRAPHIC STUDIES:  I have personally reviewed the radiological images as listed and agreed with the findings in the report.  CLINICAL DATA: Screening.  EXAM: DIGITAL SCREENING BILATERAL MAMMOGRAM WITH CAD  COMPARISON: Previous Exam(s)    BI-RADS CATEGORY 0: Incomplete. Need additional imaging evaluation and/or prior mammograms for comparison.   Electronically Signed  By: Shon Hale M.D.  On: 03/02/2014 16:14    CLINICAL DATA: Right breast calcifications at recent screening mammography.  EXAM: DIGITAL DIAGNOSTIC RIGHT MAMMOGRAM  COMPARISON: Previous examinations, including the screening mammogram dated 03/02/2014.  RECOMMENDATION: Right breast stereotactic guided core needle biopsy. This has been scheduled for 9:45 a.m. on 04/01/2014 at the [facility].  I have discussed the findings and recommendations with the patient. Results were also provided in writing at the conclusion of the visit. If applicable, a reminder letter will be sent to the patient regarding the next appointment.  BI-RADS CATEGORY 4: Suspicious.   Electronically Signed  By: Enrique Sack M.D.  On: 03/17/2014 17:25  ASSESSMENT & PLAN:   DCIS R breast ER+ PR-, focal atypical ductal hyperplasia  81 year old female with excellent performance status and newly diagnosed DCIS of the right breast. Tumor is estrogen receptor positive and progesterone receptor negative. Total tumor dimension was 0.2 cm. There was evidence of atypical ductal hyperplasia. She has done very well with surgical resection. She has opted not to pursue radiation therapy but is very interested in trying endocrine therapy. She has not had a hysterectomy.  We discussed the role of endocrine therapy principally as a means of chemoprevention. We discussed the use of both tamoxifen and aromatase inhibitors. She has baseline osteopenia documented on a DEXA from June 2013. We discussed tamoxifen and Aromasin and the risks and benefits associated with both. She has opted to proceed with tamoxifen therapy. I advised her of the uterine associated risks and again reviewed with her that if she develops any vaginal spotting she is to notify us immediately.   Osteopenia on DEXA 2013  She is to continue calcium and vitamin D. She is very active and I have encouraged her to remain as active as possible. We will discuss repeating her DEXA  at her next follow-up.   All questions were answered. The patient knows to call the clinic with any problems, questions or concerns.  This note was electronically signed.    Molli Hazard, MD  08/12/2014 10:18 AM

## 2014-08-13 ENCOUNTER — Encounter (HOSPITAL_COMMUNITY): Payer: Medicare Other | Attending: Hematology & Oncology | Admitting: Oncology

## 2014-08-13 ENCOUNTER — Encounter (HOSPITAL_BASED_OUTPATIENT_CLINIC_OR_DEPARTMENT_OTHER): Payer: Medicare Other

## 2014-08-13 ENCOUNTER — Encounter (HOSPITAL_COMMUNITY): Payer: Self-pay | Admitting: Oncology

## 2014-08-13 VITALS — BP 112/62 | HR 80 | Temp 98.9°F | Resp 18 | Wt 156.0 lb

## 2014-08-13 DIAGNOSIS — C50919 Malignant neoplasm of unspecified site of unspecified female breast: Secondary | ICD-10-CM

## 2014-08-13 DIAGNOSIS — N183 Chronic kidney disease, stage 3 unspecified: Secondary | ICD-10-CM

## 2014-08-13 DIAGNOSIS — D0511 Intraductal carcinoma in situ of right breast: Secondary | ICD-10-CM | POA: Insufficient documentation

## 2014-08-13 HISTORY — DX: Chronic kidney disease, stage 3 unspecified: N18.30

## 2014-08-13 HISTORY — DX: Intraductal carcinoma in situ of right breast: D05.11

## 2014-08-13 LAB — COMPREHENSIVE METABOLIC PANEL
ALBUMIN: 3.7 g/dL (ref 3.5–5.2)
ALK PHOS: 78 U/L (ref 39–117)
ALT: 15 U/L (ref 0–35)
ANION GAP: 6 (ref 5–15)
AST: 20 U/L (ref 0–37)
BILIRUBIN TOTAL: 0.5 mg/dL (ref 0.3–1.2)
BUN: 18 mg/dL (ref 6–23)
CO2: 29 mmol/L (ref 19–32)
Calcium: 9.6 mg/dL (ref 8.4–10.5)
Chloride: 103 mmol/L (ref 96–112)
Creatinine, Ser: 1.05 mg/dL (ref 0.50–1.10)
GFR calc Af Amer: 57 mL/min — ABNORMAL LOW (ref >90)
GFR calc non Af Amer: 49 mL/min — ABNORMAL LOW (ref >90)
GLUCOSE: 133 mg/dL — AB (ref 70–99)
POTASSIUM: 3.9 mmol/L (ref 3.5–5.1)
Sodium: 138 mmol/L (ref 135–145)
Total Protein: 6.7 g/dL (ref 6.0–8.3)

## 2014-08-13 LAB — CBC WITH DIFFERENTIAL/PLATELET
Basophils Absolute: 0 10*3/uL (ref 0.0–0.1)
Basophils Relative: 1 % (ref 0–1)
EOS ABS: 0.3 10*3/uL (ref 0.0–0.7)
Eosinophils Relative: 5 % (ref 0–5)
HCT: 38.2 % (ref 36.0–46.0)
Hemoglobin: 12.1 g/dL (ref 12.0–15.0)
LYMPHS ABS: 2.5 10*3/uL (ref 0.7–4.0)
Lymphocytes Relative: 42 % (ref 12–46)
MCH: 28.5 pg (ref 26.0–34.0)
MCHC: 31.7 g/dL (ref 30.0–36.0)
MCV: 90.1 fL (ref 78.0–100.0)
Monocytes Absolute: 0.6 10*3/uL (ref 0.1–1.0)
Monocytes Relative: 10 % (ref 3–12)
NEUTROS PCT: 42 % — AB (ref 43–77)
Neutro Abs: 2.5 10*3/uL (ref 1.7–7.7)
Platelets: 250 10*3/uL (ref 150–400)
RBC: 4.24 MIL/uL (ref 3.87–5.11)
RDW: 12.8 % (ref 11.5–15.5)
WBC: 5.9 10*3/uL (ref 4.0–10.5)

## 2014-08-13 NOTE — Patient Instructions (Addendum)
[place] at [facility] Discharge Instructions  RECOMMENDATIONS MADE BY THE CONSULTANT AND ANY TEST RESULTS WILL BE SENT TO YOUR REFERRING PHYSICIAN.  Office visit and lab work in 3 months.   Thank you for choosing [place] at [facility] to provide your oncology and hematology care.  To afford each patient quality time with our provider, please arrive at least 15 minutes before your scheduled appointment time.    You need to re-schedule your appointment should you arrive 10 or more minutes late.  We strive to give you quality time with our providers, and arriving late affects you and other patients whose appointments are after yours.  Also, if you no show three or more times for appointments you may be dismissed from the clinic at the providers discretion.     Again, thank you for choosing [facility].  Our hope is that these requests will decrease the amount of time that you wait before being seen by our physicians.       _____________________________________________________________  Should you have questions after your visit to [facility], please contact our office at [phone] between the hours of 8:30 a.m. and 4:30 p.m.  Voicemails left after 4:30 p.m. will not be returned until the following business day.  For prescription refill requests, have your pharmacy contact our office.

## 2014-08-13 NOTE — Progress Notes (Signed)
LABS DRAWN

## 2014-08-13 NOTE — Assessment & Plan Note (Addendum)
DCIS of the right breast, estrogen receptor positive, and progesterone receptor negative. Total tumor dimension was 0.2 cm. There was evidence of atypical ductal hyperplasia. She has done very well with lumpectomy on 06/10/2014 by Dr. Arnoldo Morale. She opted not to pursue radiation therapy but was interested in trying endocrine therapy; therefore, Tamoxifen was started on 07/17/2014. She has not had a hysterectomy.   Labs today: CBC diff, CMET  Labs in 3 months: CBC diff, CMET  Next screening mammogram is due in November 2016.  She notes some right upper extremity edema which is not noticed on exam.  I suspect this is lymphedema from her surgery of right breast since there is no tenderness to palpation, erythema, and noticeable edema.  I have recommend massage, compression sleeve, and/or observation.  She is only interested in self massaging and observation at this time.  She will let us know if it worsens.   Return in 3 months for follow-up.

## 2014-08-13 NOTE — Progress Notes (Signed)
Melissa Deleon., MD [place] [address]  Ductal carcinoma in situ (DCIS) of right breast - Plan: CBC with Differential, Comprehensive metabolic panel  CURRENT THERAPY: Tamoxifen daily beginning on 07/17/2014  INTERVAL HISTORY: Melissa Deleon 81 y.o. female returns for followup of DCIS of the right breast, estrogen receptor positive, and progesterone receptor negative. Total tumor dimension was 0.2 cm. There was evidence of atypical ductal hyperplasia. She has done very well with lumpectomy on 06/10/2014 by Dr. Arnoldo Deleon. She has opted not to pursue radiation therapy but is very interested in trying endocrine therapy. She has not had a hysterectomy.   I personally reviewed and went over laboratory results with the patient.  The results are noted within this dictation.  I personally reviewed and went over radiographic studies with the patient.  The results are noted within this dictation.  Next mammogram is due in November 2016.  She is tolerating Tamoxifen well without any major issues.  She does note some hot flashes that are manageable and do not interfere with QOL at this time.  She denies any abnormal arthralgias and myalgias.    She reports right upper extremity edema, that is not noticeable on exam.  No tenderness.  I provided education regarding lymphedema.  She declines intervention at this time.  Oncologically, she denies any other complaints and ROS questioning is negative.  Past Medical History  Diagnosis Date  . Hypertension   . GERD (gastroesophageal reflux disease)   . Breast cancer   . Ductal carcinoma in situ (DCIS) of right breast 08/13/2014    has HYPERTENSION, UNSPECIFIED; ARTHRITIS, CHRONIC; Urge incontinence; Uterovaginal prolapse, complete; and Ductal carcinoma in situ (DCIS) of right breast on her problem list.     is allergic to sulfa antibiotics.  [name] does not currently have medications on file.  Past Surgical History    Procedure Laterality Date  . No past surgeries      RING PLACED TO HOLD BLADDER   . Lumbar laminectomy/decompression microdiscectomy  03/07/2012    Procedure: LUMBAR LAMINECTOMY/DECOMPRESSION MICRODISCECTOMY 1 LEVEL;  Surgeon: Melissa Connors, MD;  Location: [place] [facility];  Service: Neurosurgery;  Laterality: Left;  Left Lumbar four-five Laminectomy for Synovial cyst  . Bladder ring    . Back surgery    . Kyphoplasty N/A 06/11/2012    Procedure: KYPHOPLASTY (C-Arm x 2);  Surgeon: Melissa Connors, MD;  Location: [facility];  Service: Neurosurgery;  Laterality: N/A;  Lumbar two Kyphoplasty  . Breast biopsy Right 06/10/2014    Procedure: RIGHT BREAST BIOPSY AFTER NEEDLE LOCALIZATION;  Surgeon: Melissa So, MD;  Location: [facility];  Service: General;  Laterality: Right;  . Breast lumpectomy Right 06/2014    Denies any headaches, dizziness, double vision, fevers, chills, night sweats, nausea, vomiting, diarrhea, constipation, chest pain, heart palpitations, shortness of breath, blood in stool, black tarry stool, urinary pain, urinary burning, urinary frequency, hematuria.   PHYSICAL EXAMINATION  ECOG PERFORMANCE STATUS: 0 - Asymptomatic  Filed Vitals:   08/13/14 1331  BP: 112/62  Pulse: 80  Temp: 98.9 F (37.2 C)  Resp: 18    GENERAL:alert, no distress, well nourished, well developed, comfortable, cooperative and smiling SKIN: skin color, texture, turgor are normal, no rashes or significant lesions HEAD: Normocephalic, No masses, lesions, tenderness or abnormalities EYES: normal, PERRLA, EOMI, Conjunctiva are pink and non-injected EARS: External ears normal OROPHARYNX:lips, buccal mucosa, and tongue normal and mucous membranes are moist  NECK: supple, no adenopathy, thyroid normal size, non-tender, without nodularity, no stridor, non-tender, trachea midline LYMPH:  no palpable lymphadenopathy, no hepatosplenomegaly BREAST:left breast normal without mass, skin or nipple changes or  axillary nodes, post-lumpectomy site well healed and free of suspicious changes LUNGS: clear to auscultation and percussion HEART: regular rate & rhythm, no murmurs, no gallops, S1 normal and S2 normal ABDOMEN:abdomen soft, non-tender, obese, normal bowel sounds, no masses or organomegaly and no hepatosplenomegaly BACK: Back symmetric, no curvature., No CVA tenderness EXTREMITIES:less then 2 second capillary refill, no joint deformities, effusion, or inflammation, no skin discoloration, no clubbing, no cyanosis, positive findings:  edema 1+ pitting edema in LE bilaterally. No erythema, edema, heat, or pain in right upper extremity. NEURO: alert & oriented x 3 with fluent speech, no focal motor/sensory deficits, gait normal   LABORATORY DATA: CBC    Component Value Date/Time   WBC 5.9 08/13/2014 1300   RBC 4.24 08/13/2014 1300   HGB 12.1 08/13/2014 1300   HCT 38.2 08/13/2014 1300   PLT 250 08/13/2014 1300   MCV 90.1 08/13/2014 1300   MCH 28.5 08/13/2014 1300   MCHC 31.7 08/13/2014 1300   RDW 12.8 08/13/2014 1300   LYMPHSABS 2.5 08/13/2014 1300   MONOABS 0.6 08/13/2014 1300   EOSABS 0.3 08/13/2014 1300   BASOSABS 0.0 08/13/2014 1300      Chemistry      Component Value Date/Time   NA 138 08/13/2014 1300   K 3.9 08/13/2014 1300   CL 103 08/13/2014 1300   CO2 29 08/13/2014 1300   BUN 18 08/13/2014 1300   CREATININE 1.05 08/13/2014 1300      Component Value Date/Time   CALCIUM 9.6 08/13/2014 1300   ALKPHOS 78 08/13/2014 1300   AST 20 08/13/2014 1300   ALT 15 08/13/2014 1300   BILITOT 0.5 08/13/2014 1300       ASSESSMENT AND PLAN:  Ductal carcinoma in situ (DCIS) of right breast  DCIS of the right breast, estrogen receptor positive, and progesterone receptor negative. Total tumor dimension was 0.2 cm. There was evidence of atypical ductal hyperplasia. She has done very well with lumpectomy on 06/10/2014 by Dr. Arnoldo Deleon. She opted not to pursue radiation therapy but was  interested in trying endocrine therapy; therefore, Tamoxifen was started on 07/17/2014. She has not had a hysterectomy.   Labs today: CBC diff, CMET  Labs in 3 months: CBC diff, CMET  Next screening mammogram is due in November 2016.  She notes some right upper extremity edema which is not noticed on exam.  I suspect this is lymphedema from her surgery of right breast since there is no tenderness to palpation, erythema, and noticeable edema.  I have recommend massage, compression sleeve, and/or observation.  She is only interested in self massaging and observation at this time.  She will let us know if it worsens.   Return in 3 months for follow-up.    THERAPY PLAN:  NCCN guidelines recommends the following for DCIS postsurgical treatment:  A. Risk reduction therapy for ipsilateral breast-conserving surgery:   1. Consider endocrine therapy for 5 years:    A. Patient treated with breast-conserving therapy (lumpectomy) and radiation therapy (category 1), especially for those with ER-positive DCIS    B. The benefit of endocrine therapy for ER-negative DCIS is uncertain    C. Patients treated with excision alone   2. Endocrine therapy:    A. Tamoxifen for premenopausal patients    B. Tamoxifen or aromatase inhibitor for postmenopausal  patients with some advantage for aromatase inhibitor therapy in patients < 72 years old or with concerns for thromboembolism  B. Risk reduction therapy for contralateral breast   1. Counseling regarding risk reduction.   All questions were answered. The patient knows to call the clinic with any problems, questions or concerns. We can certainly see the patient much sooner if necessary.  Patient and plan discussed with Dr. Ancil Linsey and she is in agreement with the aforementioned.   This note is electronically signed by: Robynn Pane 08/13/2014 1:56 PM

## 2014-09-29 ENCOUNTER — Ambulatory Visit: Payer: Medicare Other | Admitting: Obstetrics & Gynecology

## 2014-10-08 ENCOUNTER — Other Ambulatory Visit (HOSPITAL_COMMUNITY): Payer: Self-pay | Admitting: Hematology & Oncology

## 2014-10-13 ENCOUNTER — Other Ambulatory Visit (HOSPITAL_COMMUNITY): Payer: Self-pay | Admitting: Oncology

## 2014-10-13 DIAGNOSIS — D0511 Intraductal carcinoma in situ of right breast: Secondary | ICD-10-CM

## 2014-10-13 MED ORDER — TAMOXIFEN CITRATE 20 MG PO TABS: 20.0000 mg | ORAL_TABLET | Freq: Every day | ORAL | Status: DC

## 2014-10-20 ENCOUNTER — Ambulatory Visit (INDEPENDENT_AMBULATORY_CARE_PROVIDER_SITE_OTHER): Payer: Medicare Other | Admitting: Obstetrics & Gynecology

## 2014-10-20 ENCOUNTER — Encounter: Payer: Self-pay | Admitting: Obstetrics & Gynecology

## 2014-10-20 VITALS — BP 128/70 | HR 72 | Wt 155.0 lb

## 2014-10-20 DIAGNOSIS — N813 Complete uterovaginal prolapse: Secondary | ICD-10-CM

## 2014-10-20 NOTE — Progress Notes (Signed)
Patient ID: Melissa Deleon, female   DOB: 14-May-1933, 81 y.o.   MRN: [id number] Chief Complaint  Patient presents with  . 3 month follow-up    clean pessary.    Blood pressure 128/70, pulse 72, weight 155 lb (70.308 kg).  Melissa Deleon presents today for routine follow up related to her pessary.   She uses a Milex ring with support # 4 She reports little vaginal discharge or vaginal bleeding.  Exam reveals no undue vaginal mucosal pressure of breakdown, no discharge and no vaginal bleeding.  The pessary is removed, cleaned and replaced without difficulty.    Melissa Deleon will be sen back in 3 months for continued follow up.  Florian Buff, MD 10/20/2014 9:44 AM

## 2014-11-12 ENCOUNTER — Encounter (HOSPITAL_COMMUNITY): Payer: Medicare Other | Attending: Oncology | Admitting: Oncology

## 2014-11-12 ENCOUNTER — Encounter (HOSPITAL_BASED_OUTPATIENT_CLINIC_OR_DEPARTMENT_OTHER): Payer: Medicare Other

## 2014-11-12 VITALS — BP 141/70 | HR 89 | Temp 99.0°F | Resp 18 | Wt 155.3 lb

## 2014-11-12 DIAGNOSIS — D0511 Intraductal carcinoma in situ of right breast: Secondary | ICD-10-CM

## 2014-11-12 DIAGNOSIS — R609 Edema, unspecified: Secondary | ICD-10-CM | POA: Diagnosis not present

## 2014-11-12 LAB — COMPREHENSIVE METABOLIC PANEL
ALBUMIN: 3.7 g/dL (ref 3.5–5.0)
ALT: 15 U/L (ref 14–54)
ANION GAP: 8 (ref 5–15)
AST: 22 U/L (ref 15–41)
Alkaline Phosphatase: 83 U/L (ref 38–126)
BUN: 18 mg/dL (ref 6–20)
CHLORIDE: 102 mmol/L (ref 101–111)
CO2: 29 mmol/L (ref 22–32)
CREATININE: 1.07 mg/dL — AB (ref 0.44–1.00)
Calcium: 9 mg/dL (ref 8.9–10.3)
GFR calc non Af Amer: 48 mL/min — ABNORMAL LOW (ref >60)
GFR, EST AFRICAN AMERICAN: 56 mL/min — AB (ref >60)
GLUCOSE: 170 mg/dL — AB (ref 65–99)
POTASSIUM: 3.7 mmol/L (ref 3.5–5.1)
Sodium: 139 mmol/L (ref 135–145)
Total Bilirubin: 0.6 mg/dL (ref 0.3–1.2)
Total Protein: 6.7 g/dL (ref 6.5–8.1)

## 2014-11-12 LAB — CBC WITH DIFFERENTIAL/PLATELET
BASOS PCT: 1 % (ref 0–1)
Basophils Absolute: 0.1 10*3/uL (ref 0.0–0.1)
EOS PCT: 5 % (ref 0–5)
Eosinophils Absolute: 0.3 10*3/uL (ref 0.0–0.7)
HCT: 37.9 % (ref 36.0–46.0)
Hemoglobin: 12.1 g/dL (ref 12.0–15.0)
Lymphocytes Relative: 40 % (ref 12–46)
Lymphs Abs: 2.7 10*3/uL (ref 0.7–4.0)
MCH: 29.5 pg (ref 26.0–34.0)
MCHC: 31.9 g/dL (ref 30.0–36.0)
MCV: 92.4 fL (ref 78.0–100.0)
MONOS PCT: 8 % (ref 3–12)
Monocytes Absolute: 0.5 10*3/uL (ref 0.1–1.0)
Neutro Abs: 3.1 10*3/uL (ref 1.7–7.7)
Neutrophils Relative %: 46 % (ref 43–77)
Platelets: 233 10*3/uL (ref 150–400)
RBC: 4.1 MIL/uL (ref 3.87–5.11)
RDW: 13.1 % (ref 11.5–15.5)
WBC: 6.7 10*3/uL (ref 4.0–10.5)

## 2014-11-12 MED ORDER — SPIRONOLACTONE 50 MG PO TABS: 100.0000 mg | ORAL_TABLET | Freq: Every day | ORAL | Status: DC | PRN

## 2014-11-12 NOTE — Progress Notes (Signed)
LABS DRAWN

## 2014-11-12 NOTE — Progress Notes (Signed)
Melissa Deleon., MD [place] [address]  Ductal carcinoma in situ (DCIS) of right breast  Dependent edema - Plan: spironolactone (ALDACTONE) 50 MG tablet  CURRENT THERAPY: Tamoxifen daily beginning on 07/17/2014  INTERVAL HISTORY: Melissa Deleon 81 y.o. female returns for followup of DCIS of the right breast, ER + and PR negative. Total tumor dimension was 0.2 cm. There was evidence of atypical ductal hyperplasia. She has done very well with lumpectomy on 06/10/2014 by Dr. Arnoldo Morale. She has opted not to pursue radiation therapy but is very interested in trying endocrine therapy. She has not had a hysterectomy.   I personally reviewed and went over laboratory results with the patient.  The results are noted within this dictation.  I personally reviewed and went over radiographic studies with the patient.  The results are noted within this dictation.  Next mammogram is due in November 2016.  She continues to tolerate Tamoxifen well without any major side effects of this treatment.  She complains of B/L LE edema that is dependent.  It improves by AM upon awakening and progresses throughout the day.  Tamoxifen can cause peripheral neuropathy (11%).  She notes that it is painful.  I will provide 100 mg of Spironolactone to be used PRN.  She is also encouraged to keep LE elevated when resting throughout the day.    Past Medical History  Diagnosis Date  . Hypertension   . GERD (gastroesophageal reflux disease)   . Breast cancer   . Ductal carcinoma in situ (DCIS) of right breast 08/13/2014  . Chronic renal disease, stage 3, moderately decreased glomerular filtration rate (GFR) between 30-59 mL/min/1.73 square meter 08/13/2014    has HYPERTENSION, UNSPECIFIED; ARTHRITIS, CHRONIC; Urge incontinence; Uterovaginal prolapse, complete; Ductal carcinoma in situ (DCIS) of right breast; and Chronic renal disease, stage 3, moderately decreased glomerular filtration rate  (GFR) between 30-59 mL/min/1.73 square meter on her problem list.     is allergic to sulfa antibiotics.  Melissa Deleon does not currently have medications on file.  Past Surgical History  Procedure Laterality Date  . No past surgeries      RING PLACED TO HOLD BLADDER   . Lumbar laminectomy/decompression microdiscectomy  03/07/2012    Procedure: LUMBAR LAMINECTOMY/DECOMPRESSION MICRODISCECTOMY 1 LEVEL;  Surgeon: Otilio Connors, MD;  Location: [place] [facility] ORS;  Service: Neurosurgery;  Laterality: Left;  Left Lumbar four-five Laminectomy for Synovial cyst  . Bladder ring    . Back surgery    . Kyphoplasty N/A 06/11/2012    Procedure: KYPHOPLASTY (C-Arm x 2);  Surgeon: Otilio Connors, MD;  Location: [facility]UR[facility]S;  Service: Neurosurgery;  Laterality: N/A;  Lumbar two Kyphoplasty  . Breast biopsy Right 06/10/2014    Procedure: RIGHT BREAST BIOPSY AFTER NEEDLE LOCALIZATION;  Surgeon: Jamesetta So, MD;  Location: [facility];  Service: General;  Laterality: Right;  . Breast lumpectomy Right 06/2014    Denies any headaches, dizziness, double vision, fevers, chills, night sweats, nausea, vomiting, diarrhea, constipation, chest pain, heart palpitations, shortness of breath, blood in stool, black tarry stool, urinary pain, urinary burning, urinary frequency, hematuria.   PHYSICAL EXAMINATION  ECOG PERFORMANCE STATUS: 0 - Asymptomatic  Filed Vitals:   11/12/14 1248  BP: 141/70  Pulse: 89  Temp: 99 F (37.2 C)  Resp: 18    GENERAL:alert, no distress, well nourished, well developed, comfortable, cooperative and smiling SKIN: skin color, texture, turgor are normal, no rashes or significant lesions  HEAD: Normocephalic, No masses, lesions, tenderness or abnormalities EYES: normal, PERRLA, EOMI, Conjunctiva are pink and non-injected EARS: External ears normal OROPHARYNX:lips, buccal mucosa, and tongue normal and mucous membranes are moist  NECK: supple, no adenopathy, thyroid normal size, non-tender,  without nodularity, no stridor, non-tender, trachea midline LYMPH:  no palpable lymphadenopathy, no hepatosplenomegaly BREAST:not examined LUNGS: clear to auscultation and percussion HEART: regular rate & rhythm, no murmurs, no gallops, S1 normal and S2 normal ABDOMEN:abdomen soft, non-tender, obese, normal bowel sounds, no masses or organomegaly BACK: Back symmetric, no curvature., No CVA tenderness EXTREMITIES:less then 2 second capillary refill, no joint deformities, effusion, or inflammation, no skin discoloration, no clubbing, no cyanosis, positive findings:  edema 2+ pitting edema in LE bilaterally, mainly in ankles and feet. NEURO: alert & oriented x 3 with fluent speech, no focal motor/sensory deficits, gait normal   LABORATORY DATA: CBC    Component Value Date/Time   WBC 6.7 11/12/2014 1243   RBC 4.10 11/12/2014 1243   HGB 12.1 11/12/2014 1243   HCT 37.9 11/12/2014 1243   PLT 233 11/12/2014 1243   MCV 92.4 11/12/2014 1243   MCH 29.5 11/12/2014 1243   MCHC 31.9 11/12/2014 1243   RDW 13.1 11/12/2014 1243   LYMPHSABS 2.7 11/12/2014 1243   MONOABS 0.5 11/12/2014 1243   EOSABS 0.3 11/12/2014 1243   BASOSABS 0.1 11/12/2014 1243      Chemistry      Component Value Date/Time   NA 139 11/12/2014 1243   K 3.7 11/12/2014 1243   CL 102 11/12/2014 1243   CO2 29 11/12/2014 1243   BUN 18 11/12/2014 1243   CREATININE 1.07* 11/12/2014 1243      Component Value Date/Time   CALCIUM 9.0 11/12/2014 1243   ALKPHOS 83 11/12/2014 1243   AST 22 11/12/2014 1243   ALT 15 11/12/2014 1243   BILITOT 0.6 11/12/2014 1243       ASSESSMENT AND PLAN:  Ductal carcinoma in situ (DCIS) of right breast  DCIS of the right breast, estrogen receptor positive, and progesterone receptor negative. Total tumor dimension was 0.2 cm. There was evidence of atypical ductal hyperplasia. She has done very well with lumpectomy on 06/10/2014 by Dr. Arnoldo Morale. She opted not to pursue radiation therapy but was  interested in trying endocrine therapy; therefore, Tamoxifen was started on 07/17/2014. She has not had a hysterectomy.   Labs today: CBC diff, CMET  Labs in 6 months: CBC diff, CMET  Next screening mammogram is due in November 2016.  Rx for Spironolactone 100 mg daily PRN for LE edema due to being symptomatic from this issue.  It may be secondary to Tamoxifen (11%) but given recent heat wave and recent exacerbation, it is likely from this.  Return in 6 months for follow-up.    THERAPY PLAN:  NCCN guidelines recommends the following for DCIS postsurgical treatment:  A. Risk reduction therapy for ipsilateral breast-conserving surgery:   1. Consider endocrine therapy for 5 years:    A. Patient treated with breast-conserving therapy (lumpectomy) and radiation therapy (category 1), especially for those with ER-positive DCIS    B. The benefit of endocrine therapy for ER-negative DCIS is uncertain    C. Patients treated with excision alone   2. Endocrine therapy:    A. Tamoxifen for premenopausal patients    B. Tamoxifen or aromatase inhibitor for postmenopausal patients with some advantage for aromatase inhibitor therapy in patients < 65 years old or with concerns for thromboembolism  B. Risk reduction therapy  for contralateral breast   1. Counseling regarding risk reduction.  NCCN guidelines recommends the following surveillance for DCIS of breast:  1. Interval history and physical exam every 6-12 months for 5 years, then annually.  2. Mammogram every 12 months (and 6-12 months postradiation therapy if breast conserved [Category 2B]).  3. If treated with Tamoxifen, monitor per NCCN guidelines for Breast Cancer Risk Reduction.    All questions were answered. The patient knows to call the clinic with any problems, questions or concerns. We can certainly see the patient much sooner if necessary.  Patient and plan discussed with Dr. Ancil Linsey and she is in agreement with the  aforementioned.   This note is electronically signed by: Robynn Pane 11/12/2014 1:42 PM

## 2014-11-12 NOTE — Patient Instructions (Signed)
[place] at [facility] Discharge Instructions  RECOMMENDATIONS MADE BY THE CONSULTANT AND ANY TEST RESULTS WILL BE SENT TO YOUR REFERRING PHYSICIAN.  A prescription for Spironolactone (aldactone) was sent to your pharmacy. This medication is to be used ONLY AS NEEDED for swelling in your lower extremities. Keep your legs elevated when sitting. Watch the amount of sodium/salt intake in your diet. Lab work and MD appointment again in 6 months. Report any issues/concerns to clinic as needed prior to appointments. Return as scheduled.  Thank you for choosing [place] at [facility] to provide your oncology and hematology care.  To afford each patient quality time with our provider, please arrive at least 15 minutes before your scheduled appointment time.    You need to re-schedule your appointment should you arrive 10 or more minutes late.  We strive to give you quality time with our providers, and arriving late affects you and other patients whose appointments are after yours.  Also, if you no show three or more times for appointments you may be dismissed from the clinic at the providers discretion.     Again, thank you for choosing [facility].  Our hope is that these requests will decrease the amount of time that you wait before being seen by our physicians.       _____________________________________________________________  Should you have questions after your visit to [facility], please contact our office at [phone] between the hours of 8:30 a.m. and 4:30 p.m.  Voicemails left after 4:30 p.m. will not be returned until the following business day.  For prescription refill requests, have your pharmacy contact our office.

## 2014-11-12 NOTE — Assessment & Plan Note (Addendum)
DCIS of the right breast, estrogen receptor positive, and progesterone receptor negative. Total tumor dimension was 0.2 cm. There was evidence of atypical ductal hyperplasia. She has done very well with lumpectomy on 06/10/2014 by Dr. Arnoldo Morale. She opted not to pursue radiation therapy but was interested in trying endocrine therapy; therefore, Tamoxifen was started on 07/17/2014. She has not had a hysterectomy.   Labs today: CBC diff, CMET  Labs in 6 months: CBC diff, CMET  Next screening mammogram is due in November 2016.  Rx for Spironolactone 100 mg daily PRN for LE edema due to being symptomatic from this issue.  It may be secondary to Tamoxifen (11%) but given recent heat wave and recent exacerbation, it is likely from this.  Return in 6 months for follow-up.

## 2015-01-19 ENCOUNTER — Other Ambulatory Visit (HOSPITAL_COMMUNITY): Payer: Self-pay | Admitting: Family Medicine

## 2015-01-19 ENCOUNTER — Encounter: Payer: Self-pay | Admitting: Obstetrics & Gynecology

## 2015-01-19 ENCOUNTER — Ambulatory Visit (INDEPENDENT_AMBULATORY_CARE_PROVIDER_SITE_OTHER): Payer: Medicare Other | Admitting: Obstetrics & Gynecology

## 2015-01-19 VITALS — BP 100/60 | HR 80 | Wt 151.0 lb

## 2015-01-19 DIAGNOSIS — Z1231 Encounter for screening mammogram for malignant neoplasm of breast: Secondary | ICD-10-CM

## 2015-01-19 DIAGNOSIS — N813 Complete uterovaginal prolapse: Secondary | ICD-10-CM | POA: Diagnosis not present

## 2015-01-19 DIAGNOSIS — N390 Urinary tract infection, site not specified: Secondary | ICD-10-CM | POA: Diagnosis not present

## 2015-01-19 MED ORDER — CEPHALEXIN 500 MG PO CAPS: 500.0000 mg | ORAL_CAPSULE | Freq: Three times a day (TID) | ORAL | Status: DC

## 2015-01-19 NOTE — Progress Notes (Signed)
Patient ID: [name], female   DOB: 06-Sep-1933, 81 y.o.   MRN: [id number] Chief Complaint  Patient presents with  . gyn visit    clean/check pessary. c/c vaginaldischarge. wipe seem blood.    Blood pressure 100/60, pulse 80, weight 151 lb (68.493 kg).  Melissa Deleon presents today for routine follow up related to her pessary.   She uses a milex ring with support #3 She reports little vaginal discharge or vaginal bleeding.  Exam reveals no undue vaginal mucosal pressure of breakdown, no discharge and no vaginal bleeding.  The pessary is removed, cleaned and replaced without difficulty.    Also with UTI symptoms which is confirmed in past, will Rx keflex 500 TID x 7 days  Melissa Deleon will be sen back in 4 months for continued follow up.   Florian Buff, MD   01/19/2015 10:03 AM

## 2015-02-04 ENCOUNTER — Other Ambulatory Visit (HOSPITAL_COMMUNITY): Payer: Self-pay | Admitting: Hematology & Oncology

## 2015-02-08 ENCOUNTER — Other Ambulatory Visit (HOSPITAL_COMMUNITY): Payer: Self-pay | Admitting: Oncology

## 2015-02-08 DIAGNOSIS — D0511 Intraductal carcinoma in situ of right breast: Secondary | ICD-10-CM

## 2015-02-08 MED ORDER — TAMOXIFEN CITRATE 20 MG PO TABS: 20.0000 mg | ORAL_TABLET | Freq: Every day | ORAL | Status: DC

## 2015-03-05 ENCOUNTER — Other Ambulatory Visit (HOSPITAL_COMMUNITY): Payer: Self-pay | Admitting: Family Medicine

## 2015-03-05 ENCOUNTER — Ambulatory Visit (HOSPITAL_COMMUNITY)
Admission: RE | Admit: 2015-03-05 | Discharge: 2015-03-05 | Disposition: A | Discharge status: Home / Self Care | Payer: Medicare Other | Source: Ambulatory Visit | Attending: Family Medicine | Admitting: Family Medicine

## 2015-03-05 DIAGNOSIS — Z9889 Other specified postprocedural states: Secondary | ICD-10-CM

## 2015-03-05 DIAGNOSIS — Z1231 Encounter for screening mammogram for malignant neoplasm of breast: Secondary | ICD-10-CM

## 2015-03-23 ENCOUNTER — Ambulatory Visit (HOSPITAL_COMMUNITY)
Admission: RE | Admit: 2015-03-23 | Discharge: 2015-03-23 | Disposition: A | Discharge status: Home / Self Care | Payer: Medicare Other | Source: Ambulatory Visit | Attending: Family Medicine | Admitting: Family Medicine

## 2015-03-23 DIAGNOSIS — Z9889 Other specified postprocedural states: Secondary | ICD-10-CM | POA: Insufficient documentation

## 2015-03-23 DIAGNOSIS — Z853 Personal history of malignant neoplasm of breast: Secondary | ICD-10-CM | POA: Diagnosis not present

## 2015-04-21 ENCOUNTER — Other Ambulatory Visit (HOSPITAL_COMMUNITY): Payer: Self-pay

## 2015-04-21 DIAGNOSIS — D0511 Intraductal carcinoma in situ of right breast: Secondary | ICD-10-CM

## 2015-05-16 NOTE — Assessment & Plan Note (Addendum)
DCIS of the right breast, estrogen receptor positive, and progesterone receptor negative. Total tumor dimension was 0.2 cm. There was evidence of atypical ductal hyperplasia. She has done very well with lumpectomy on 06/10/2014 by Dr. Arnoldo Morale. She opted not to pursue radiation therapy but was interested in trying endocrine therapy; therefore, Tamoxifen was started on 07/17/2014. She has not had a hysterectomy.   Labs today: CBC diff, CMET.  No labs in 3 months.  She notes progressive vaginal bleeding, requiring a pad daily because it comes and goes unpredictably.  When she is bleeding, she notes that it lasts days.  She reports that it started as a pink discharge, but now, it is red blood.  She has a follow-up appointment with Dr. Elonda Husky regarding her pessary on 05/24/2015.  I have asked her to HOLD her TAMOXIFEN until this if further evaluated by Gynecology.  I have placed a call with Dr. Elonda Husky regarding this new complaint from the patient.  Given that Tamoxifen can cause secondary malignancies, including endometrial cancer, in addition to non-cancerous issues, namely fibroids, it is best to hold her Tamoxifen until this is sorted out and confirmed to be a benign issue.  Return in 3 months for follow-up.  If all is well, we can return to every 6 month appointments thereafter again.  Next screening mammogram is due in December 2017.  Addendum: I was able to speak with Dr. Elonda Husky.  He recommended the patient continue with Tamoxifen and he will evaluate her endometrium at her follow-up appointment on 05/24/2015.  Message sent to nursing to inform the patient of this change in her plan.  She is to continue Tamoxifen daily.

## 2015-05-16 NOTE — Progress Notes (Signed)
Melissa Deleon, [place] [id number]  Ductal carcinoma in situ (DCIS) of right breast  CURRENT THERAPY: Tamoxifen daily beginning on 07/17/2014  INTERVAL HISTORY: Melissa Deleon 82 y.o. female returns for followup of DCIS of the right breast, ER + and PR negative. Total tumor dimension was 0.2 cm. There was evidence of atypical ductal hyperplasia. She has done very well with lumpectomy on 06/10/2014 by Dr. Arnoldo Morale. She has opted not to pursue radiation therapy but is very interested in trying endocrine therapy. She has not had a hysterectomy.   I personally reviewed and went over laboratory results with the patient.  The results are noted within this dictation.  I personally reviewed and went over radiographic studies with the patient.  The results are noted within this dictation.  Mammogram on 03/23/2015 was BIRADS 2.  She was very excited that her mammogram was negative that she cried joyous tears on he way home from the mammogram.  She continues to tolerate Tamoxifen well without any major side effects of this treatment, namely arthralgias, myalgias, and hot flashes.  HOWEVER, on further questioning, she does not vaginal bleeding that is new/progressive.  She reports that it started months ago with a pink discharge.  She thought it was secondary to pessary treatment, but it has progressed to red blood.  She notes that it is painless.  She reports that she wears a pad daily because some days there is no bleeding while other days, she will bleed and it could last days.  I have asked her to HOLD her TAMOXIFEN until this is better sorted out.    Otherwise, she is doing well with no complaints.  She will be 82 years old in 3 months.   Past Medical History  Diagnosis Date  . Hypertension   . GERD (gastroesophageal reflux disease)   . Breast cancer ([place])   . Ductal carcinoma in situ (DCIS) of right breast 08/13/2014  . Chronic renal disease, stage 3,  moderately decreased glomerular filtration rate (GFR) between 30-59 mL/min/1.73 square meter 08/13/2014    has HYPERTENSION, UNSPECIFIED; ARTHRITIS, CHRONIC; Urge incontinence; Uterovaginal prolapse, complete; Ductal carcinoma in situ (DCIS) of right breast; and Chronic renal disease, stage 3, moderately decreased glomerular filtration rate (GFR) between 30-59 mL/min/1.73 square meter on her problem list.     is allergic to sulfa antibiotics.  Ms. Limbert does not currently have medications on file.  Past Surgical History  Procedure Laterality Date  . No past surgeries      RING PLACED TO HOLD BLADDER   . Lumbar laminectomy/decompression microdiscectomy  03/07/2012    Procedure: LUMBAR LAMINECTOMY/DECOMPRESSION MICRODISCECTOMY 1 LEVEL;  Surgeon: Otilio Connors, MD;  Location: [place] NEURO ORS;  Service: Neurosurgery;  Laterality: Left;  Left Lumbar four-five Laminectomy for Synovial cyst  . Bladder ring    . Back surgery    . Kyphoplasty N/A 06/11/2012    Procedure: KYPHOPLASTY (C-Arm x 2);  Surgeon: Otilio Connors, MD;  Location: [facility] NEURO ORS;  Service: Neurosurgery;  Laterality: N/A;  Lumbar two Kyphoplasty  . Breast biopsy Right 06/10/2014    Procedure: RIGHT BREAST BIOPSY AFTER NEEDLE LOCALIZATION;  Surgeon: Jamesetta So, MD;  Location: [facility];  Service: General;  Laterality: Right;  . Breast lumpectomy Right 06/2014    Denies any headaches, dizziness, double vision, fevers, chills, night sweats, nausea, vomiting, diarrhea, constipation, chest pain, heart palpitations, shortness of breath, blood in stool, black tarry  stool, urinary pain, urinary burning, urinary frequency, hematuria.   PHYSICAL EXAMINATION  ECOG PERFORMANCE STATUS: 0 - Asymptomatic  Filed Vitals:   05/17/15 1312  BP: 132/65  Pulse: 91  Temp: 98.3 F (36.8 C)  Resp: 18    GENERAL:alert, no distress, well nourished, well developed, comfortable, cooperative and smiling SKIN: skin color, texture, turgor are normal, no  rashes or significant lesions HEAD: Normocephalic, No masses, lesions, tenderness or abnormalities EYES: normal, PERRLA, EOMI, Conjunctiva are pink and non-injected EARS: External ears normal OROPHARYNX:lips, buccal mucosa, and tongue normal and mucous membranes are moist  NECK: supple, no adenopathy, thyroid normal size, non-tender, without nodularity, no stridor, non-tender, trachea midline LYMPH:  no palpable lymphadenopathy, no hepatosplenomegaly BREAST:self-breast exams discussed.  Breast exam deferred given recent mammogram. LUNGS: clear to auscultation and percussion HEART: regular rate & rhythm, no murmurs, no gallops, S1 normal and S2 normal ABDOMEN:abdomen soft, non-tender, obese, normal bowel sounds, no masses or organomegaly BACK: Back symmetric, no curvature., No CVA tenderness EXTREMITIES:less then 2 second capillary refill, no joint deformities, effusion, or inflammation, no skin discoloration, no clubbing, no cyanosis. NEURO: alert & oriented x 3 with fluent speech, no focal motor/sensory deficits, gait normal   LABORATORY DATA: CBC    Component Value Date/Time   WBC 6.4 05/17/2015 1220   RBC 4.53 05/17/2015 1220   HGB 13.3 05/17/2015 1220   HCT 41.6 05/17/2015 1220   PLT 246 05/17/2015 1220   MCV 91.8 05/17/2015 1220   MCH 29.4 05/17/2015 1220   MCHC 32.0 05/17/2015 1220   RDW 12.3 05/17/2015 1220   LYMPHSABS 2.4 05/17/2015 1220   MONOABS 0.5 05/17/2015 1220   EOSABS 0.3 05/17/2015 1220   BASOSABS 0.1 05/17/2015 1220      Chemistry      Component Value Date/Time   NA 138 05/17/2015 1220   K 3.5 05/17/2015 1220   CL 99* 05/17/2015 1220   CO2 31 05/17/2015 1220   BUN 16 05/17/2015 1220   CREATININE 0.90 05/17/2015 1220      Component Value Date/Time   CALCIUM 9.5 05/17/2015 1220   ALKPHOS 91 05/17/2015 1220   AST 25 05/17/2015 1220   ALT 16 05/17/2015 1220   BILITOT 0.6 05/17/2015 1220       ASSESSMENT AND PLAN:  Ductal carcinoma in situ (DCIS)  of right breast  DCIS of the right breast, estrogen receptor positive, and progesterone receptor negative. Total tumor dimension was 0.2 cm. There was evidence of atypical ductal hyperplasia. She has done very well with lumpectomy on 06/10/2014 by Dr. Arnoldo Morale. She opted not to pursue radiation therapy but was interested in trying endocrine therapy; therefore, Tamoxifen was started on 07/17/2014. She has not had a hysterectomy.   Labs today: CBC diff, CMET.  No labs in 3 months.  She notes progressive vaginal bleeding, requiring a pad daily because it comes and goes unpredictably.  When she is bleeding, she notes that it lasts days.  She reports that it started as a pink discharge, but now, it is red blood.  She has a follow-up appointment with Dr. Elonda Husky regarding her pessary on 05/24/2015.  I have asked her to HOLD her TAMOXIFEN until this if further evaluated by Gynecology.  I have placed a call with Dr. Elonda Husky regarding this new complaint from the patient.  Given that Tamoxifen can cause secondary malignancies, including endometrial cancer, in addition to non-cancerous issues, namely fibroids, it is best to hold her Tamoxifen until this is sorted out and confirmed  to be a benign issue.  Return in 3 months for follow-up.  If all is well, we can return to every 6 month appointments thereafter again.  Next screening mammogram is due in December 2017.  Addendum: I was able to speak with Dr. Elonda Husky.  He recommended the patient continue with Tamoxifen and he will evaluate her endometrium at her follow-up appointment on 05/24/2015.  Message sent to nursing to inform the patient of this change in her plan.  She is to continue Tamoxifen daily.    THERAPY PLAN:  NCCN guidelines recommends the following for DCIS postsurgical treatment:  A. Risk reduction therapy for ipsilateral breast-conserving surgery:   1. Consider endocrine therapy for 5 years:    A. Patient treated with breast-conserving therapy (lumpectomy)  and radiation therapy (category 1), especially for those with ER-positive DCIS    B. The benefit of endocrine therapy for ER-negative DCIS is uncertain    C. Patients treated with excision alone   2. Endocrine therapy:    A. Tamoxifen for premenopausal patients    B. Tamoxifen or aromatase inhibitor for postmenopausal patients with some advantage for aromatase inhibitor therapy in patients < 76 years old or with concerns for thromboembolism  B. Risk reduction therapy for contralateral breast   1. Counseling regarding risk reduction.  NCCN guidelines recommends the following surveillance for DCIS of breast:  1. Interval history and physical exam every 6-12 months for 5 years, then annually.  2. Mammogram every 12 months (and 6-12 months postradiation therapy if breast conserved [Category 2B]).  3. If treated with Tamoxifen, monitor per NCCN guidelines for Breast Cancer Risk Reduction.    All questions were answered. The patient knows to call the clinic with any problems, questions or concerns. We can certainly see the patient much sooner if necessary.  Patient and plan discussed with Dr. Ancil Linsey and she is in agreement with the aforementioned.   This note is electronically signed by: Robynn Pane 05/17/2015 3:15 PM

## 2015-05-17 ENCOUNTER — Encounter (HOSPITAL_COMMUNITY): Payer: PPO | Attending: Oncology | Admitting: Oncology

## 2015-05-17 ENCOUNTER — Encounter (HOSPITAL_COMMUNITY): Payer: PPO

## 2015-05-17 ENCOUNTER — Encounter (HOSPITAL_COMMUNITY): Payer: Self-pay | Admitting: Oncology

## 2015-05-17 VITALS — BP 132/65 | HR 91 | Temp 98.3°F | Resp 18 | Wt 155.0 lb

## 2015-05-17 DIAGNOSIS — D0511 Intraductal carcinoma in situ of right breast: Secondary | ICD-10-CM | POA: Diagnosis not present

## 2015-05-17 DIAGNOSIS — Z170 Estrogen receptor positive status [ER+]: Secondary | ICD-10-CM | POA: Diagnosis not present

## 2015-05-17 DIAGNOSIS — N939 Abnormal uterine and vaginal bleeding, unspecified: Secondary | ICD-10-CM | POA: Diagnosis not present

## 2015-05-17 LAB — COMPREHENSIVE METABOLIC PANEL
ALK PHOS: 91 U/L (ref 38–126)
ALT: 16 U/L (ref 14–54)
ANION GAP: 8 (ref 5–15)
AST: 25 U/L (ref 15–41)
Albumin: 3.8 g/dL (ref 3.5–5.0)
BUN: 16 mg/dL (ref 6–20)
CALCIUM: 9.5 mg/dL (ref 8.9–10.3)
CO2: 31 mmol/L (ref 22–32)
Chloride: 99 mmol/L — ABNORMAL LOW (ref 101–111)
Creatinine, Ser: 0.9 mg/dL (ref 0.44–1.00)
GFR, EST NON AFRICAN AMERICAN: 59 mL/min — AB (ref >60)
Glucose, Bld: 128 mg/dL — ABNORMAL HIGH (ref 65–99)
Potassium: 3.5 mmol/L (ref 3.5–5.1)
Sodium: 138 mmol/L (ref 135–145)
Total Bilirubin: 0.6 mg/dL (ref 0.3–1.2)
Total Protein: 7 g/dL (ref 6.5–8.1)

## 2015-05-17 LAB — CBC WITH DIFFERENTIAL/PLATELET
Basophils Absolute: 0.1 10*3/uL (ref 0.0–0.1)
Basophils Relative: 1 %
EOS PCT: 5 %
Eosinophils Absolute: 0.3 10*3/uL (ref 0.0–0.7)
HCT: 41.6 % (ref 36.0–46.0)
HEMOGLOBIN: 13.3 g/dL (ref 12.0–15.0)
LYMPHS ABS: 2.4 10*3/uL (ref 0.7–4.0)
Lymphocytes Relative: 38 %
MCH: 29.4 pg (ref 26.0–34.0)
MCHC: 32 g/dL (ref 30.0–36.0)
MCV: 91.8 fL (ref 78.0–100.0)
MONOS PCT: 8 %
Monocytes Absolute: 0.5 10*3/uL (ref 0.1–1.0)
NEUTROS PCT: 48 %
Neutro Abs: 3.1 10*3/uL (ref 1.7–7.7)
Platelets: 246 10*3/uL (ref 150–400)
RBC: 4.53 MIL/uL (ref 3.87–5.11)
RDW: 12.3 % (ref 11.5–15.5)
WBC: 6.4 10*3/uL (ref 4.0–10.5)

## 2015-05-17 NOTE — Patient Instructions (Addendum)
[place] at [facility] Discharge Instructions  RECOMMENDATIONS MADE BY THE CONSULTANT AND ANY TEST RESULTS WILL BE SENT TO YOUR REFERRING PHYSICIAN.  Exam and discussion today with Melissa Crigler, PA. Hold Tamoxifen due to vaginal bleeding.  Follow up with Melissa Deleon on Monday. Return in 3 months for follow up with Melissa Deleon.  Thank you for choosing [place] at [facility] to provide your oncology and hematology care.  To afford each patient quality time with our provider, please arrive at least 15 minutes before your scheduled appointment time.   Beginning January 23rd 2017 lab work for the Melissa Deleon will be done in the  Main lab at [facility] on 1st floor. If you have a lab appointment with the [facility] please come in thru the  Main Entrance and check in at the main information desk  You need to re-schedule your appointment should you arrive 10 or more minutes late.  We strive to give you quality time with our providers, and arriving late affects you and other patients whose appointments are after yours.  Also, if you no show three or more times for appointments you may be dismissed from the clinic at the providers discretion.     Again, thank you for choosing [facility].  Our hope is that these requests will decrease the amount of time that you wait before being seen by our physicians.       _____________________________________________________________  Should you have questions after your visit to [facility], please contact our office at [phone] between the hours of 8:30 a.m. and 4:30 p.m.  Voicemails left after 4:30 p.m. will not be returned until the following business day.  For prescription refill requests, have your pharmacy contact our office.

## 2015-05-24 ENCOUNTER — Ambulatory Visit (INDEPENDENT_AMBULATORY_CARE_PROVIDER_SITE_OTHER): Payer: PPO | Admitting: Obstetrics & Gynecology

## 2015-05-24 ENCOUNTER — Telehealth (HOSPITAL_COMMUNITY): Payer: Self-pay | Admitting: Emergency Medicine

## 2015-05-24 ENCOUNTER — Encounter: Payer: Self-pay | Admitting: Obstetrics & Gynecology

## 2015-05-24 ENCOUNTER — Other Ambulatory Visit (INDEPENDENT_AMBULATORY_CARE_PROVIDER_SITE_OTHER): Payer: PPO

## 2015-05-24 ENCOUNTER — Ambulatory Visit: Payer: Medicare Other | Admitting: Obstetrics & Gynecology

## 2015-05-24 VITALS — BP 120/70 | HR 74 | Wt 154.0 lb

## 2015-05-24 DIAGNOSIS — R938 Abnormal findings on diagnostic imaging of other specified body structures: Secondary | ICD-10-CM | POA: Diagnosis not present

## 2015-05-24 DIAGNOSIS — Z79810 Long term (current) use of selective estrogen receptor modulators (SERMs): Secondary | ICD-10-CM

## 2015-05-24 DIAGNOSIS — N950 Postmenopausal bleeding: Secondary | ICD-10-CM

## 2015-05-24 DIAGNOSIS — N813 Complete uterovaginal prolapse: Secondary | ICD-10-CM

## 2015-05-24 DIAGNOSIS — R9389 Abnormal findings on diagnostic imaging of other specified body structures: Secondary | ICD-10-CM

## 2015-05-24 NOTE — Progress Notes (Signed)
Patient ID: Melissa Deleon, female   DOB: June 18, 1933, 82 y.o.   MRN: [id number]      Chief Complaint  Patient presents with  . gyn visit    clean and check pessary/ ultrasound    Blood pressure 120/70, pulse 74, weight 154 lb (69.854 kg).  82 y.o. No obstetric history on file. No LMP recorded. Patient is postmenopausal. The current method of family planning is post menopausal status.  Subjective Patient has had some vaginal bleeding on and off for the past so a month or 2 It started light and has worsened Over the past 3 weeks or so it's been more like a period, not necessarily every day but most days Melissa Deleon called me last week to inform me of this and I had him keep her on her tamoxifen for now  she had a scheduled appointment to have her pessary removed and cleaned today so we just waited till today for evaluation of the vaginal bleeding  Objective See the pessary below No mucosal breakdown no blood in the vault today this does not seem to be caused by the pessary itself  Pertinent ROS No burning with urination, frequency or urgency No nausea, vomiting or diarrhea Nor fever chills or other constitutional symptoms No pain  Labs or studies US Transvaginal Non-ob  05/24/2015  GYNECOLOGIC SONOGRAM Melissa Deleon is a 82 y.o. she is here for a pelvic sonogram for postmenopausal bleeding . Uterus                      7.7 x 4.3 x 5.2 cm, heterogenous anteverted uterus Endometrium          17.4 mm, symmetrical, complex thickened endometrium Right ovary             1.9 x 1.3 x 1.6 cm, wnl Left ovary                1.6 x 1.1 x 1.3 cm, wnl No free fluid seen Technician Comments: PELVIC US TA/TV: heterogenous anteverted uterus,normal ov's bilat,thickened, complex EEC w/color flow 17.50mm,no free fluid,no pain during ultrasound [employer] 05/24/2015 3:01 PM Clinical Impression and recommendations: I have reviewed the sonogram results above, combined with the patient's current clinical  course, below are my impressions and any appropriate recommendations for management based on the sonographic findings. Significantly thickened and complex endometrium, pt on tamoxifen Norma uterus otherwise Normal ovaries Will need thorough endometrial evaluation with hysteroscopy uterine curettage as an outpatient Leman Martinek H 05/24/2015 3:06 PM   US Pelvis Complete  05/24/2015  GYNECOLOGIC SONOGRAM Melissa Deleon is a 82 y.o. she is here for a pelvic sonogram for postmenopausal bleeding . Uterus                      7.7 x 4.3 x 5.2 cm, heterogenous anteverted uterus Endometrium          17.4 mm, symmetrical, complex thickened endometrium Right ovary             1.9 x 1.3 x 1.6 cm, wnl Left ovary                1.6 x 1.1 x 1.3 cm, wnl No free fluid seen Technician Comments: PELVIC US TA/TV: heterogenous anteverted uterus,normal ov's bilat,thickened, complex EEC w/color flow 17.72mm,no free fluid,no pain during ultrasound [employer] 05/24/2015 3:01 PM Clinical Impression and recommendations: I have reviewed the sonogram results above, combined with the patient's  current clinical course, below are my impressions and any appropriate recommendations for management based on the sonographic findings. Significantly thickened and complex endometrium, pt on tamoxifen Norma uterus otherwise Normal ovaries Will need thorough endometrial evaluation with hysteroscopy uterine curettage as an outpatient Collier Bohnet H 05/24/2015 3:06 PM      Impression Diagnoses this Encounter::   ICD-9-CM ICD-10-CM   1. Post-menopausal bleeding 627.1 N95.0 US Pelvis Complete     US Transvaginal Non-OB  2. Use of tamoxifen (Nolvadex) V07.51 Z79.810 US Pelvis Complete     US Transvaginal Non-OB  3. Uterovaginal prolapse, complete 618.3 N81.3   4. Thickened endometrium 793.5 R93.8     Established relevant diagnosis(es): Breast cancer on tamoxifen therapy  Plan/Recommendations: No orders of the defined types were placed in this  encounter.    Labs or Scans Ordered: Orders Placed This Encounter  Procedures  . US Pelvis Complete  . US Transvaginal Non-OB    Management:: I'm planning to schedule her for hysteroscopy and uterine curettage Wednesday, February 15 for evaluation of a thickened complex endometrium of 17 mm The patient understands this is an evaluation for the possibility of endometrial hyperplasia or endometrial cancer most likely as a consequence of tamoxifen therapy We will then see her back week after surgery to go over the results and see how she is doing For now she can continue the tamoxifen  Follow up 06/09/2015       All questions were answered.  Chief Complaint  Patient presents with  . gyn visit    clean and check pessary/ ultrasound    Blood pressure 120/70, pulse 74, weight 154 lb (69.854 kg).  Melissa Deleon presents today for routine follow up related to her pessary.   She uses a milex ring with support #4  She reports moderate vaginal discharge or vaginal bleeding.  Exam reveals no undue vaginal mucosal pressure of breakdown, no discharge and no vaginal bleeding.  The pessary is removed, cleaned and replaced without difficulty.    Melissa Deleon will be sen back in 4 months for continued follow up.  The pessary was fitting nicely and was not causing any mucosal breakdown as the reason for her bleeding  Florian Buff, MD   05/24/2015 3:08 PM

## 2015-05-24 NOTE — Telephone Encounter (Signed)
Called pt to tell her to stop taking tamoxifen until her workup with Dr Elonda Husky is completed and wether it is determined to be malignant or not.  Pt verbalized understanding.

## 2015-05-24 NOTE — Telephone Encounter (Signed)
-----   Message from Baird Cancer, PA-C sent at 05/24/2015  4:14 PM EST -----   ----- Message -----    From: Florian Buff, MD    Sent: 05/24/2015   3:25 PM      To: Baird Cancer, PA-C

## 2015-05-24 NOTE — Progress Notes (Signed)
Please call patient and ask her to stop her Tamoxifen until work-up is complete for her endometrial cyst.  If negative for malignancy, she can restart her Tamoxifen.  Robynn Pane, PA-C 05/24/2015 4:14 PM

## 2015-05-24 NOTE — Progress Notes (Signed)
PELVIC US TA/TV: heterogenous anteverted uterus,normal ov's bilat,thickened EEC w/color flow 17.81mm,no free fluid,no pain during ultrasound

## 2015-05-27 ENCOUNTER — Other Ambulatory Visit: Payer: Self-pay | Admitting: Obstetrics & Gynecology

## 2015-05-27 NOTE — Patient Instructions (Addendum)
Postmenopausal Bleeding Postmenopausal bleeding is any bleeding after menopause. Menopause is when a woman's period stops. Any type of bleeding after menopause is concerning. It should be checked by your doctor. Any treatment will depend on the cause. HOME CARE Watch your condition for any changes.  Avoid the use of tampons and douches as told by your doctor.  Change your pads often.  Get regular pelvic exams and Pap tests.  Keep all appointments for tests as told by your doctor. GET HELP IF:  Your bleeding lasts for more than 1 week.  You have belly (abdominal) pain.  You have bleeding after sex (intercourse). GET HELP RIGHT AWAY IF:   You have a fever, chills, a headache, dizziness, muscle aches, and bleeding.  You have strong pain with bleeding.  You have clumps of blood (blood clots) coming from your vagina.  You have bleeding and need more than 1 pad an hour.  You feel like you are going to pass out (faint). MAKE SURE YOU:  Understand these instructions.  Will watch your condition.  Will get help right away if you are not doing well or get worse.   This information is not intended to replace advice given to you by your health care provider. Make sure you discuss any questions you have with your health care provider.   Document Released: 01/11/2008 Document Revised: 04/08/2013 Document Reviewed: 10/31/2012 Elsevier Interactive Patient Education [employer]. Hysteroscopy Hysteroscopy is a procedure used for looking inside the womb (uterus). It may be done for various reasons, including:  To evaluate abnormal bleeding, fibroid (benign, noncancerous) tumors, polyps, scar tissue (adhesions), and possibly cancer of the uterus.  To look for lumps (tumors) and other uterine growths.  To look for causes of why a woman cannot get pregnant (infertility), causes of recurrent loss of pregnancy (miscarriages), or a lost intrauterine device (IUD).  To  perform a sterilization by blocking the fallopian tubes from inside the uterus. In this procedure, a thin, flexible tube with a tiny light and camera on the end of it (hysteroscope) is used to look inside the uterus. A hysteroscopy should be done right after a menstrual period to be sure you are not pregnant. LET [facility] CARE PROVIDER KNOW ABOUT:   Any allergies you have.  All medicines you are taking, including vitamins, herbs, eye drops, creams, and over-the-counter medicines.  Previous problems you or members of your family have had with the use of anesthetics.  Any blood disorders you have.  Previous surgeries you have had.  Medical conditions you have. RISKS AND COMPLICATIONS  Generally, this is a safe procedure. However, as with any procedure, complications can occur. Possible complications include:  Putting a hole in the uterus.  Excessive bleeding.  Infection.  Damage to the cervix.  Injury to other organs.  Allergic reaction to medicines.  Too much fluid used in the uterus for the procedure. BEFORE THE PROCEDURE   Ask your health care provider about changing or stopping any regular medicines.  Do not take aspirin or blood thinners for 1 week before the procedure, or as directed by your health care provider. These can cause bleeding.  If you smoke, do not smoke for 2 weeks before the procedure.  In some cases, a medicine is placed in the cervix the day before the procedure. This medicine makes the cervix have a larger opening (dilate). This makes it easier for the instrument to be inserted into the uterus  during the procedure.  Do not eat or drink anything for at least 8 hours before the surgery.  Arrange for someone to take you home after the procedure. PROCEDURE   You may be given a medicine to relax you (sedative). You may also be given one of the following:  A medicine that numbs the area around the cervix (local anesthetic).  A medicine that makes  you sleep through the procedure (general anesthetic).  The hysteroscope is inserted through the vagina into the uterus. The camera on the hysteroscope sends a picture to a TV screen. This gives the surgeon a good view inside the uterus.  During the procedure, air or a liquid is put into the uterus, which allows the surgeon to see better.  Sometimes, tissue is gently scraped from inside the uterus. These tissue samples are sent to a lab for testing. AFTER THE PROCEDURE   If you had a general anesthetic, you may be groggy for a couple hours after the procedure.  If you had a local anesthetic, you will be able to go home as soon as you are stable and feel ready.  You may have some cramping. This normally lasts for a couple days.  You may have bleeding, which varies from light spotting for a few days to menstrual-like bleeding for 3-7 days. This is normal.  If your test results are not back during the visit, make an appointment with your health care provider to find out the results.   This information is not intended to replace advice given to you by your health care provider. Make sure you discuss any questions you have with your health care provider.   Document Released: 07/10/2000 Document Revised: 01/22/2013 Document Reviewed: 10/31/2012 Elsevier Interactive Patient Education 2016 [place]. Hysteroscopy, Care After Refer to this sheet in the next few weeks. These instructions provide you with information on caring for yourself after your procedure. Your health care provider may also give you more specific instructions. Your treatment has been planned according to current medical practices, but problems sometimes occur. Call your health care provider if you have any problems or questions after your procedure.  WHAT TO EXPECT AFTER THE PROCEDURE After your procedure, it is typical to have the following:  You may have some cramping. This normally lasts for a couple days.  You may have  bleeding. This can vary from light spotting for a few days to menstrual-like bleeding for 3-7 days. HOME CARE INSTRUCTIONS  Rest for the first 1-2 days after the procedure.  Only take over-the-counter or prescription medicines as directed by your health care provider. Do not take aspirin. It can increase the chances of bleeding.  Take showers instead of baths for 2 weeks or as directed by your health care provider.  Do not drive for 24 hours or as directed.  Do not drink alcohol while taking pain medicine.  Do not use tampons, douche, or have sexual intercourse for 2 weeks or until your health care provider says it is okay.  Take your temperature twice a day for 4-5 days. Write it down each time.  Follow your health care provider's advice about diet, exercise, and lifting.  If you develop constipation, you may:  Take a mild laxative if your health care provider approves.  Add bran foods to your diet.  Drink enough fluids to keep your urine clear or pale yellow.  Try to have someone with you or available to you for the first 24-48 hours, especially if you  were given a general anesthetic.  Follow up with your health care provider as directed. SEEK MEDICAL CARE IF:  You feel dizzy or lightheaded.  You feel sick to your stomach (nauseous).  You have abnormal vaginal discharge.  You have a rash.  You have pain that is not controlled with medicine. SEEK IMMEDIATE MEDICAL CARE IF:  You have bleeding that is heavier than a normal menstrual period.  You have a fever.  You have increasing cramps or pain, not controlled with medicine.  You have new belly (abdominal) pain.  You pass out.  You have pain in the tops of your shoulders (shoulder strap areas).  You have shortness of breath.   This information is not intended to replace advice given to you by your health care provider. Make sure you discuss any questions you have with your health care provider.   Document  Released: 01/22/2013 Document Reviewed: 01/22/2013 Elsevier Interactive Patient Education 2016 Elsevier Inc[employer] PATIENT INSTRUCTIONS POST-ANESTHESIA  IMMEDIATELY FOLLOWING SURGERY:  Do not drive or operate machinery for the first twenty four hours after surgery.  Do not make any important decisions for twenty four hours after surgery or while taking narcotic pain medications or sedatives.  If you develop intractable nausea and vomiting or a severe headache please notify your doctor immediately.  FOLLOW-UP:  Please make an appointment with your surgeon as instructed. You do not need to follow up with anesthesia unless specifically instructed to do so.  WOUND CARE INSTRUCTIONS (if applicable):  Keep a dry clean dressing on the anesthesia/puncture wound site if there is drainage.  Once the wound has quit draining you may leave it open to air.  Generally you should leave the bandage intact for twenty four hours unless there is drainage.  If the epidural site drains for more than 36-48 hours please call the anesthesia department.  QUESTIONS?:  Please feel free to call your physician or the hospital operator if you have any questions, and they will be happy to assist you.         EDWARD MINDER  05/27/2015     @[email or web address] @   Your procedure is scheduled on  06/02/2015   Report to [facility] at  [place]  Call this number if you have problems the morning of surgery:  [phone]   Remember:  Do not eat food or drink liquids after midnight.  Take these medicines the morning of surgery with A SIP OF WATER  Lisinopril, omeprazole, tamoxifen.   Do not wear jewelry, make-up or nail polish.  Do not wear lotions, powders, or perfumes.  You may wear deodorant.  Do not shave 48 hours prior to surgery.  Men may shave face and neck.  Do not bring valuables to the hospital.  [facility] is not responsible for any belongings or valuables.  Contacts, dentures or bridgework may not be worn into  surgery.  Leave your suitcase in the car.  After surgery it may be brought to your room.  For patients admitted to the hospital, discharge time will be determined by your treatment team.  Patients discharged the day of surgery will not be allowed to drive home.   Name and phone number of your driver:   family Special instructions:  none  Please read over the following fact sheets that you were given. Coughing and Deep Breathing, Surgical Site Infection Prevention, Anesthesia Post-op Instructions and Care and Recovery After Surgery

## 2015-05-28 ENCOUNTER — Encounter (HOSPITAL_COMMUNITY): Payer: Self-pay

## 2015-05-28 ENCOUNTER — Other Ambulatory Visit: Payer: Self-pay

## 2015-05-28 ENCOUNTER — Encounter (HOSPITAL_COMMUNITY)
Admission: RE | Admit: 2015-05-28 | Discharge: 2015-05-28 | Disposition: A | Discharge status: Home / Self Care | Payer: PPO | Source: Ambulatory Visit | Attending: Obstetrics & Gynecology | Admitting: Obstetrics & Gynecology

## 2015-05-28 ENCOUNTER — Other Ambulatory Visit (HOSPITAL_COMMUNITY): Payer: PPO

## 2015-05-28 DIAGNOSIS — Z01812 Encounter for preprocedural laboratory examination: Secondary | ICD-10-CM | POA: Insufficient documentation

## 2015-05-28 DIAGNOSIS — N950 Postmenopausal bleeding: Secondary | ICD-10-CM | POA: Diagnosis not present

## 2015-05-28 DIAGNOSIS — Z01818 Encounter for other preprocedural examination: Secondary | ICD-10-CM | POA: Insufficient documentation

## 2015-05-28 LAB — COMPREHENSIVE METABOLIC PANEL
ALBUMIN: 3.6 g/dL (ref 3.5–5.0)
ALK PHOS: 77 U/L (ref 38–126)
ALT: 13 U/L — ABNORMAL LOW (ref 14–54)
AST: 19 U/L (ref 15–41)
Anion gap: 7 (ref 5–15)
BUN: 13 mg/dL (ref 6–20)
CHLORIDE: 102 mmol/L (ref 101–111)
CO2: 30 mmol/L (ref 22–32)
CREATININE: 0.85 mg/dL (ref 0.44–1.00)
Calcium: 9.4 mg/dL (ref 8.9–10.3)
GFR calc non Af Amer: >60 mL/min (ref >60)
Glucose, Bld: 104 mg/dL — ABNORMAL HIGH (ref 65–99)
Potassium: 4.2 mmol/L (ref 3.5–5.1)
SODIUM: 139 mmol/L (ref 135–145)
TOTAL PROTEIN: 6.4 g/dL — AB (ref 6.5–8.1)
Total Bilirubin: 0.4 mg/dL (ref 0.3–1.2)

## 2015-05-28 LAB — CBC
HCT: 40 % (ref 36.0–46.0)
Hemoglobin: 12.8 g/dL (ref 12.0–15.0)
MCH: 29.4 pg (ref 26.0–34.0)
MCHC: 32 g/dL (ref 30.0–36.0)
MCV: 91.7 fL (ref 78.0–100.0)
PLATELETS: 254 10*3/uL (ref 150–400)
RBC: 4.36 MIL/uL (ref 3.87–5.11)
RDW: 12.3 % (ref 11.5–15.5)
WBC: 4.8 10*3/uL (ref 4.0–10.5)

## 2015-06-02 ENCOUNTER — Encounter (HOSPITAL_COMMUNITY): Payer: Self-pay | Admitting: *Deleted

## 2015-06-02 ENCOUNTER — Encounter (HOSPITAL_COMMUNITY)
Admission: RE | Disposition: A | Discharge status: Home / Self Care | Payer: Self-pay | Source: Ambulatory Visit | Attending: Obstetrics & Gynecology

## 2015-06-02 ENCOUNTER — Ambulatory Visit (HOSPITAL_COMMUNITY)
Admission: RE | Admit: 2015-06-02 | Discharge: 2015-06-02 | Disposition: A | Discharge status: Home / Self Care | Payer: PPO | Source: Ambulatory Visit | Attending: Obstetrics & Gynecology | Admitting: Obstetrics & Gynecology

## 2015-06-02 ENCOUNTER — Ambulatory Visit (HOSPITAL_COMMUNITY): Payer: PPO | Admitting: Anesthesiology

## 2015-06-02 DIAGNOSIS — N183 Chronic kidney disease, stage 3 (moderate): Secondary | ICD-10-CM | POA: Insufficient documentation

## 2015-06-02 DIAGNOSIS — R938 Abnormal findings on diagnostic imaging of other specified body structures: Secondary | ICD-10-CM | POA: Diagnosis not present

## 2015-06-02 DIAGNOSIS — N950 Postmenopausal bleeding: Secondary | ICD-10-CM | POA: Diagnosis not present

## 2015-06-02 DIAGNOSIS — I129 Hypertensive chronic kidney disease with stage 1 through stage 4 chronic kidney disease, or unspecified chronic kidney disease: Secondary | ICD-10-CM | POA: Diagnosis not present

## 2015-06-02 DIAGNOSIS — C50911 Malignant neoplasm of unspecified site of right female breast: Secondary | ICD-10-CM | POA: Insufficient documentation

## 2015-06-02 DIAGNOSIS — K219 Gastro-esophageal reflux disease without esophagitis: Secondary | ICD-10-CM | POA: Diagnosis not present

## 2015-06-02 DIAGNOSIS — Z79899 Other long term (current) drug therapy: Secondary | ICD-10-CM | POA: Diagnosis not present

## 2015-06-02 DIAGNOSIS — N840 Polyp of corpus uteri: Secondary | ICD-10-CM | POA: Diagnosis not present

## 2015-06-02 DIAGNOSIS — Z79810 Long term (current) use of selective estrogen receptor modulators (SERMs): Secondary | ICD-10-CM | POA: Diagnosis not present

## 2015-06-02 HISTORY — PX: POLYPECTOMY: SHX5525

## 2015-06-02 HISTORY — PX: HYSTEROSCOPY WITH D & C: SHX1775

## 2015-06-02 LAB — GLUCOSE, CAPILLARY
GLUCOSE-CAPILLARY: 86 mg/dL (ref 65–99)
GLUCOSE-CAPILLARY: 94 mg/dL (ref 65–99)

## 2015-06-02 SURGERY — DILATATION AND CURETTAGE /HYSTEROSCOPY
Anesthesia: General

## 2015-06-02 MED ORDER — LACTATED RINGERS IV SOLN
INTRAVENOUS | Status: DC
  Administered 2015-06-02: 09:00:00 via INTRAVENOUS

## 2015-06-02 MED ORDER — LIDOCAINE HCL (PF) 1 % IJ SOLN
INTRAMUSCULAR | Status: AC
  Filled 2015-06-02: qty 5

## 2015-06-02 MED ORDER — SODIUM CHLORIDE 0.9 % IR SOLN
Status: DC | PRN
  Administered 2015-06-02: 3000 mL

## 2015-06-02 MED ORDER — FENTANYL CITRATE (PF) 100 MCG/2ML IJ SOLN
INTRAMUSCULAR | Status: DC | PRN
  Administered 2015-06-02 (×2): 25 ug via INTRAVENOUS

## 2015-06-02 MED ORDER — HYDROCODONE-ACETAMINOPHEN 5-325 MG PO TABS: 1.0000 | ORAL_TABLET | Freq: Four times a day (QID) | ORAL | Status: DC | PRN

## 2015-06-02 MED ORDER — PROPOFOL 10 MG/ML IV BOLUS
INTRAVENOUS | Status: DC | PRN
  Administered 2015-06-02: 120 mg via INTRAVENOUS

## 2015-06-02 MED ORDER — MIDAZOLAM HCL 2 MG/2ML IJ SOLN
1.0000 mg | INTRAMUSCULAR | Status: DC | PRN
  Administered 2015-06-02: 2 mg via INTRAVENOUS
  Filled 2015-06-02: qty 2

## 2015-06-02 MED ORDER — ONDANSETRON HCL 4 MG/2ML IJ SOLN: 4.0000 mg | Freq: Once | INTRAMUSCULAR | Status: DC | PRN

## 2015-06-02 MED ORDER — FENTANYL CITRATE (PF) 100 MCG/2ML IJ SOLN
INTRAMUSCULAR | Status: AC
  Filled 2015-06-02: qty 2

## 2015-06-02 MED ORDER — CEFAZOLIN SODIUM-DEXTROSE 2-3 GM-% IV SOLR
2.0000 g | INTRAVENOUS | Status: AC
  Administered 2015-06-02: 2 g via INTRAVENOUS
  Filled 2015-06-02: qty 50

## 2015-06-02 MED ORDER — FENTANYL CITRATE (PF) 100 MCG/2ML IJ SOLN
25.0000 ug | Freq: Once | INTRAMUSCULAR | Status: AC
  Administered 2015-06-02: 25 ug via INTRAVENOUS
  Filled 2015-06-02: qty 2

## 2015-06-02 MED ORDER — MIDAZOLAM HCL 5 MG/5ML IJ SOLN
INTRAMUSCULAR | Status: DC | PRN
  Administered 2015-06-02 (×2): 1 mg via INTRAVENOUS

## 2015-06-02 MED ORDER — KETOROLAC TROMETHAMINE 10 MG PO TABS: 10.0000 mg | ORAL_TABLET | Freq: Three times a day (TID) | ORAL | Status: DC | PRN

## 2015-06-02 MED ORDER — EPHEDRINE SULFATE 50 MG/ML IJ SOLN
INTRAMUSCULAR | Status: DC | PRN
  Administered 2015-06-02: 10 mg via INTRAVENOUS

## 2015-06-02 MED ORDER — PROPOFOL 10 MG/ML IV BOLUS
INTRAVENOUS | Status: AC
  Filled 2015-06-02: qty 20

## 2015-06-02 MED ORDER — ONDANSETRON HCL 8 MG PO TABS: 8.0000 mg | ORAL_TABLET | Freq: Three times a day (TID) | ORAL | Status: DC | PRN

## 2015-06-02 MED ORDER — FENTANYL CITRATE (PF) 100 MCG/2ML IJ SOLN: 25.0000 ug | INTRAMUSCULAR | Status: DC | PRN

## 2015-06-02 MED ORDER — LIDOCAINE HCL 1 % IJ SOLN
INTRAMUSCULAR | Status: DC | PRN
  Administered 2015-06-02: 25 mg via INTRADERMAL

## 2015-06-02 MED ORDER — KETOROLAC TROMETHAMINE 30 MG/ML IJ SOLN
30.0000 mg | Freq: Once | INTRAMUSCULAR | Status: AC
  Administered 2015-06-02: 30 mg via INTRAVENOUS
  Filled 2015-06-02: qty 1

## 2015-06-02 MED ORDER — ONDANSETRON HCL 4 MG/2ML IJ SOLN
4.0000 mg | Freq: Once | INTRAMUSCULAR | Status: AC
  Administered 2015-06-02: 4 mg via INTRAVENOUS
  Filled 2015-06-02: qty 2

## 2015-06-02 MED ORDER — MIDAZOLAM HCL 2 MG/2ML IJ SOLN
INTRAMUSCULAR | Status: AC
  Filled 2015-06-02: qty 2

## 2015-06-02 SURGICAL SUPPLY — 27 items
BAG HAMPER (MISCELLANEOUS) ×2 IMPLANT
CANISTER SUCT 3000ML PPV (MISCELLANEOUS) ×2 IMPLANT
CLOTH BEACON ORANGE TIMEOUT ST (SAFETY) ×2 IMPLANT
COVER LIGHT HANDLE STERIS (MISCELLANEOUS) ×4 IMPLANT
FORMALIN 10 PREFIL 120ML (MISCELLANEOUS) ×2 IMPLANT
GAUZE SPONGE 4X4 16PLY XRAY LF (GAUZE/BANDAGES/DRESSINGS) ×2 IMPLANT
GLOVE BIOGEL PI IND STRL 7.0 (GLOVE) ×1 IMPLANT
GLOVE BIOGEL PI IND STRL 8 (GLOVE) ×1 IMPLANT
GLOVE BIOGEL PI INDICATOR 7.0 (GLOVE) ×1
GLOVE BIOGEL PI INDICATOR 8 (GLOVE) ×1
GLOVE ECLIPSE 8.0 STRL XLNG CF (GLOVE) ×2 IMPLANT
GOWN STRL REUS W/TWL LRG LVL3 (GOWN DISPOSABLE) ×4 IMPLANT
GOWN STRL REUS W/TWL XL LVL3 (GOWN DISPOSABLE) ×2 IMPLANT
INST SET HYSTEROSCOPY (KITS) ×2 IMPLANT
IV NS 1000ML (IV SOLUTION) ×2
IV NS 1000ML BAXH (IV SOLUTION) ×1 IMPLANT
KIT ROOM TURNOVER AP CYSTO (KITS) ×2 IMPLANT
MANIFOLD NEPTUNE II (INSTRUMENTS) ×2 IMPLANT
NS IRRIG 1000ML POUR BTL (IV SOLUTION) ×2 IMPLANT
PACK BASIC III (CUSTOM PROCEDURE TRAY) ×2
PACK SRG BSC III STRL LF ECLPS (CUSTOM PROCEDURE TRAY) ×1 IMPLANT
PAD ARMBOARD 7.5X6 YLW CONV (MISCELLANEOUS) ×2 IMPLANT
PAD TELFA 3X4 1S STER (GAUZE/BANDAGES/DRESSINGS) ×2 IMPLANT
SET IRRIG Y TYPE TUR BLADDER L (SET/KITS/TRAYS/PACK) ×2 IMPLANT
SHEET LAVH (DRAPES) ×2 IMPLANT
TOWEL OR 17X26 4PK STRL BLUE (TOWEL DISPOSABLE) ×2 IMPLANT
YANKAUER SUCT BULB TIP 10FT TU (MISCELLANEOUS) ×2 IMPLANT

## 2015-06-02 NOTE — Transfer of Care (Signed)
Immediate Anesthesia Transfer of Care Note  Patient: Melissa Deleon  Procedure(s) Performed: Procedure(s): DILATATION AND CURETTAGE /HYSTEROSCOPY (N/A) Removal of endometrial Polyps (N/A)  Patient Location: PACU  Anesthesia Type:General  Level of Consciousness: awake and patient cooperative  Airway & Oxygen Therapy: Patient Spontanous Breathing and Patient connected to face mask oxygen  Post-op Assessment: Report given to RN, Post -op Vital signs reviewed and stable and Patient moving all extremities  Post vital signs: Reviewed and stable  Last Vitals:  Filed Vitals:   06/02/15 0825 06/02/15 0830  BP: 122/71 124/68  Temp:    Resp: 42 16    Complications: No apparent anesthesia complications

## 2015-06-02 NOTE — Op Note (Signed)
Preoperative diagnosis: Postmenopausal bleeding                                         Thickened endometrium,                                          Tamoxifen therapy   Postoperative diagnosis: Endometrial polyp x 4   Procedure: Operative hysteroscopy with removal of 4 polyps,   endometrial curettage  Surgeon: Tania Ade H   Anesthesia: Laryngeal mask airway   Findings: The patient experienced postmenopausal bleeding and we did a sonogram in the office which revealed a thickened endometrial stripe of 17 mm.     On hysteroscopy the patient had 4 relatively large polyps.  The endometrium itself appeared to be normal.   Description of operation:  Patient was taken to the operating room and placed in the supine position where she underwent laryngeal mask airway anesthesia. She was placed in the dorsal lithotomy position.  Her Milex ring with support pessary was removed She was prepped and draped in the usual sterile fashion.  A Graves speculum was placed.  The cervix was grasped with a single-tooth tenaculum.  The cervix was dilated serially using Hegar dilators to allow passage of the hysteroscope.  The hysteroscope was then placed into the endometrial cavity without difficulty and the above noted findings were seen.  Ring forceps ring forceps and the uterine curet were used to remove the 4 polyps. The endometrium was thoroughly curetted with good uterine cry in all areas  Hysteroscopic reevaluation confirmed the removal of all endometrial pathology  There was good hemostasis  The patient tolerated the procedure well  She experienced minimal blood loss  She was taken to the recovery room in good stable condition  All counts were correct x3  She received 2 g of Ancef and 30 mg of Toradol preoperatively  She will be followed up in the office next week for postoperative visit and to review the pathology which I expect to be benign  Pessary was replaced at the end of the  procedure  Melissa Buff, MD 06/02/2015 9:42 AM

## 2015-06-02 NOTE — Discharge Instructions (Signed)
Hysteroscopy, Care After  Refer to this sheet in the next few weeks. These instructions provide you with information on caring for yourself after your procedure. Your health care provider may also give you more specific instructions. Your treatment has been planned according to current medical practices, but problems sometimes occur. Call your health care provider if you have any problems or questions after your procedure.   WHAT TO EXPECT AFTER THE PROCEDURE  After your procedure, it is typical to have the following:  · You may have some cramping. This normally lasts for a couple days.  · You may have bleeding. This can vary from light spotting for a few days to menstrual-like bleeding for 3-7 days.  HOME CARE INSTRUCTIONS  · Rest for the first 1-2 days after the procedure.  · Only take over-the-counter or prescription medicines as directed by your health care provider. Do not take aspirin. It can increase the chances of bleeding.  · Take showers instead of baths for 2 weeks or as directed by your health care provider.  · Do not drive for 24 hours or as directed.  · Do not drink alcohol while taking pain medicine.  · Do not use tampons, douche, or have sexual intercourse for 2 weeks or until your health care provider says it is okay.  · Take your temperature twice a day for 4-5 days. Write it down each time.  · Follow your health care provider's advice about diet, exercise, and lifting.  · If you develop constipation, you may:    Take a mild laxative if your health care provider approves.    Add bran foods to your diet.    Drink enough fluids to keep your urine clear or pale yellow.  · Try to have someone with you or available to you for the first 24-48 hours, especially if you were given a general anesthetic.  · Follow up with your health care provider as directed.  SEEK MEDICAL CARE IF:  · You feel dizzy or lightheaded.  · You feel sick to your stomach (nauseous).  · You have abnormal vaginal discharge.  · You  have a rash.  · You have pain that is not controlled with medicine.  SEEK IMMEDIATE MEDICAL CARE IF:  · You have bleeding that is heavier than a normal menstrual period.  · You have a fever.  · You have increasing cramps or pain, not controlled with medicine.  · You have new belly (abdominal) pain.  · You pass out.  · You have pain in the tops of your shoulders (shoulder strap areas).  · You have shortness of breath.     This information is not intended to replace advice given to you by your health care provider. Make sure you discuss any questions you have with your health care provider.     Document Released: 01/22/2013 Document Reviewed: 01/22/2013  Elsevier Interactive Patient Education ©2016 [employer] Inc[employer]

## 2015-06-02 NOTE — Anesthesia Postprocedure Evaluation (Signed)
Anesthesia Post Note  Patient: Melissa Deleon  Procedure(s) Performed: Procedure(s) (LRB): DILATATION AND CURETTAGE /HYSTEROSCOPY (N/A) Removal of endometrial Polyps (N/A)  Patient location during evaluation: PACU Anesthesia Type: General Level of consciousness: awake, oriented and patient cooperative Pain management: pain level controlled Vital Signs Assessment: post-procedure vital signs reviewed and stable Respiratory status: spontaneous breathing and patient connected to nasal cannula oxygen Cardiovascular status: blood pressure returned to baseline and stable Postop Assessment: no signs of nausea or vomiting Anesthetic complications: no    Last Vitals:  Filed Vitals:   06/02/15 0825 06/02/15 0830  BP: 122/71 124/68  Temp:    Resp: 42 16    Last Pain: There were no vitals filed for this visit.               Afia Messenger J

## 2015-06-02 NOTE — H&P (Signed)
Preoperative History and Physical  Melissa Deleon is a 82 y.o. No obstetric history on file. with No LMP recorded. Patient is postmenopausal. admitted for a  82 y.o. No obstetric history on file. No LMP recorded. Patient is postmenopausal. The current method of family planning is post menopausal status.  Subjective Patient has had some vaginal bleeding on and off for the past so a month or 2 It started light and has worsened Over the past 3 weeks or so it's been more like a period, not necessarily every day but most days [name] called me last week to inform me of this and I had him keep her on her tamoxifen for now  she had a scheduled appointment to have her pessary removed and cleaned today so we just waited till today for evaluation of the vaginal bleeding  Objective See the pessary below No mucosal breakdown no blood in the vault today this does not seem to be caused by the pessary itself  Pertinent ROS No burning with urination, frequency or urgency No nausea, vomiting or diarrhea Nor fever chills or other constitutional symptoms No pain  Labs or studies  Imaging Results (Last 48 hours)    US Transvaginal Non-ob  05/24/2015 GYNECOLOGIC SONOGRAM Melissa Deleon is a 82 y.o. she is here for a pelvic sonogram for postmenopausal bleeding . Uterus 7.7 x 4.3 x 5.2 cm, heterogenous anteverted uterus Endometrium 17.4 mm, symmetrical, complex thickened endometrium Right ovary 1.9 x 1.3 x 1.6 cm, wnl Left ovary 1.6 x 1.1 x 1.3 cm, wnl No free fluid seen Technician Comments: PELVIC US TA/TV: heterogenous anteverted uterus,normal ov's bilat,thickened, complex EEC w/color flow 17.33mm,no free fluid,no pain during ultrasound [employer] 05/24/2015 3:01 PM Clinical Impression and recommendations: I have reviewed the sonogram results above, combined with the patient's current clinical course, below are my impressions and any appropriate  recommendations for management based on the sonographic findings. Significantly thickened and complex endometrium, pt on tamoxifen Norma uterus otherwise Normal ovaries Will need thorough endometrial evaluation with hysteroscopy uterine curettage as an outpatient EURE,LUTHER H 05/24/2015 3:06 PM   US Pelvis Complete  05/24/2015 GYNECOLOGIC SONOGRAM Melissa Deleon is a 82 y.o. she is here for a pelvic sonogram for postmenopausal bleeding . Uterus 7.7 x 4.3 x 5.2 cm, heterogenous anteverted uterus Endometrium 17.4 mm, symmetrical, complex thickened endometrium Right ovary 1.9 x 1.3 x 1.6 cm, wnl Left ovary 1.6 x 1.1 x 1.3 cm, wnl No free fluid seen Technician Comments: PELVIC US TA/TV: heterogenous anteverted uterus,normal ov's bilat,thickened, complex EEC w/color flow 17.40mm,no free fluid,no pain during ultrasound [employer] 05/24/2015 3:01 PM Clinical Impression and recommendations: I have reviewed the sonogram results above, combined with the patient's current clinical course, below are my impressions and any appropriate recommendations for management based on the sonographic findings. Significantly thickened and complex endometrium, pt on tamoxifen Norma uterus otherwise Normal ovaries Will need thorough endometrial evaluation with hysteroscopy uterine curettage as an outpatient EURE,LUTHER H 05/24/2015 3:06 PM       Impression Diagnoses this Encounter::   ICD-9-CM ICD-10-CM   1. Post-menopausal bleeding 627.1 N95.0 US Pelvis Complete     US Transvaginal Non-OB  2. Use of tamoxifen (Nolvadex) V07.51 Z79.810 US Pelvis Complete     US Transvaginal Non-OB  3. Uterovaginal prolapse, complete 618.3 N81.3   4. Thickened endometrium 793.5 R93.8     Established relevant diagnosis(es): Breast cancer on tamoxifen therapy  Plan/Recommendations: No orders of the defined types were  placed in this encounter.   Labs  or Scans Ordered: Orders Placed This Encounter  Procedures  . US Pelvis Complete  . US Transvaginal Non-OB    Management:: I'm planning to schedule her for hysteroscopy and uterine curettage Wednesday, February 15 for evaluation of a thickened complex endometrium of 17 mm The patient understands this is an evaluation for the possibility of endometrial hyperplasia or endometrial cancer most likely as a consequence of tamoxifen therapy We will then see her back week after surgery to go over the results and see how she is doing For now she can continue the tamoxifen          PMH:    Past Medical History  Diagnosis Date  . Hypertension   . GERD (gastroesophageal reflux disease)   . Ductal carcinoma in situ (DCIS) of right breast 08/13/2014  . Chronic renal disease, stage 3, moderately decreased glomerular filtration rate (GFR) between 30-59 mL/min/1.73 square meter 08/13/2014  . Breast cancer ([place]) 05/2014    PSH:     Past Surgical History  Procedure Laterality Date  . No past surgeries      RING PLACED TO HOLD BLADDER   . Lumbar laminectomy/decompression microdiscectomy  03/07/2012    Procedure: LUMBAR LAMINECTOMY/DECOMPRESSION MICRODISCECTOMY 1 LEVEL;  Surgeon: Otilio Connors, MD;  Location: [place] [facility];  Service: Neurosurgery;  Laterality: Left;  Left Lumbar four-five Laminectomy for Synovial cyst  . Bladder ring    . Back surgery    . Kyphoplasty N/A 06/11/2012    Procedure: KYPHOPLASTY (C-Arm x 2);  Surgeon: Otilio Connors, MD;  Location: [facility];  Service: Neurosurgery;  Laterality: N/A;  Lumbar two Kyphoplasty  . Breast biopsy Right 06/10/2014    Procedure: RIGHT BREAST BIOPSY AFTER NEEDLE LOCALIZATION;  Surgeon: Jamesetta So, MD;  Location: [facility];  Service: General;  Laterality: Right;  . Breast lumpectomy Right 06/2014    POb/GynH:      OB History    No data available      SH:   Social History  Substance Use Topics  . Smoking status: Never Smoker    . Smokeless tobacco: Never Used  . Alcohol Use: No    FH:    Family History  Problem Relation Age of Onset  . Diabetes Mother   . Heart attack Father   . Cancer Sister     breast     Allergies:  Allergies  Allergen Reactions  . Sulfa Antibiotics     Nausea     Medications:       Current facility-administered medications:  .  ceFAZolin (ANCEF) IVPB 2 g/50 mL premix, 2 g, Intravenous, On Call to OR, Florian Buff, MD .  ketorolac (TORADOL) 30 MG/ML injection 30 mg, 30 mg, Intravenous, Once, Florian Buff, MD  Review of Systems:   Review of Systems  Constitutional: Negative for fever, chills, weight loss, malaise/fatigue and diaphoresis.  HENT: Negative for hearing loss, ear pain, nosebleeds, congestion, sore throat, neck pain, tinnitus and ear discharge.   Eyes: Negative for blurred vision, double vision, photophobia, pain, discharge and redness.  Respiratory: Negative for cough, hemoptysis, sputum production, shortness of breath, wheezing and stridor.   Cardiovascular: Negative for chest pain, palpitations, orthopnea, claudication, leg swelling and PND.  Gastrointestinal: Positive for abdominal pain. Negative for heartburn, nausea, vomiting, diarrhea, constipation, blood in stool and melena.  Genitourinary: Negative for dysuria, urgency, frequency, hematuria and flank pain.  Musculoskeletal: Negative for myalgias, back pain, joint  pain and falls.  Skin: Negative for itching and rash.  Neurological: Negative for dizziness, tingling, tremors, sensory change, speech change, focal weakness, seizures, loss of consciousness, weakness and headaches.  Endo/Heme/Allergies: Negative for environmental allergies and polydipsia. Does not bruise/bleed easily.  Psychiatric/Behavioral: Negative for depression, suicidal ideas, hallucinations, memory loss and substance abuse. The patient is not nervous/anxious and does not have insomnia.      PHYSICAL EXAM:  Temperature 98.2 F (36.8  C), temperature source Oral.    Vitals reviewed. Constitutional: She is oriented to person, place, and time. She appears well-developed and well-nourished.  HENT:  Head: Normocephalic and atraumatic.  Right Ear: External ear normal.  Left Ear: External ear normal.  Nose: Nose normal.  Mouth/Throat: Oropharynx is clear and moist.  Eyes: Conjunctivae and EOM are normal. Pupils are equal, round, and reactive to light. Right eye exhibits no discharge. Left eye exhibits no discharge. No scleral icterus.  Neck: Normal range of motion. Neck supple. No tracheal deviation present. No thyromegaly present.  Cardiovascular: Normal rate, regular rhythm, normal heart sounds and intact distal pulses.  Exam reveals no gallop and no friction rub.   No murmur heard. Respiratory: Effort normal and breath sounds normal. No respiratory distress. She has no wheezes. She has no rales. She exhibits no tenderness.  GI: Soft. Bowel sounds are normal. She exhibits no distension and no mass. There is tenderness. There is no rebound and no guarding.  Genitourinary:       Vulva is normal without lesions Vagina is pink moist without discharge Cervix normal in appearance and pap is normal Uterus is normal size, contour, position, consistency, mobility, non-tender Adnexa is negative with normal sized ovaries by sonogram  Musculoskeletal: Normal range of motion. She exhibits no edema and no tenderness.  Neurological: She is alert and oriented to person, place, and time. She has normal reflexes. She displays normal reflexes. No cranial nerve deficit. She exhibits normal muscle tone. Coordination normal.  Skin: Skin is warm and dry. No rash noted. No erythema. No pallor.  Psychiatric: She has a normal mood and affect. Her behavior is normal. Judgment and thought content normal.    Labs: Results for orders placed or performed during the hospital encounter of 06/02/15 (from the past 336 hour(s))  Glucose, capillary    Collection Time: 06/02/15  7:40 AM  Result Value Ref Range   Glucose-Capillary 86 65 - 99 mg/dL  Results for orders placed or performed during the hospital encounter of 05/28/15 (from the past 336 hour(s))  CBC   Collection Time: 05/28/15  8:30 AM  Result Value Ref Range   WBC 4.8 4.0 - 10.5 K/uL   RBC 4.36 3.87 - 5.11 MIL/uL   Hemoglobin 12.8 12.0 - 15.0 g/dL   HCT 40.0 36.0 - 46.0 %   MCV 91.7 78.0 - 100.0 fL   MCH 29.4 26.0 - 34.0 pg   MCHC 32.0 30.0 - 36.0 g/dL   RDW 12.3 11.5 - 15.5 %   Platelets 254 150 - 400 K/uL  Comprehensive metabolic panel   Collection Time: 05/28/15  8:30 AM  Result Value Ref Range   Sodium 139 135 - 145 mmol/L   Potassium 4.2 3.5 - 5.1 mmol/L   Chloride 102 101 - 111 mmol/L   CO2 30 22 - 32 mmol/L   Glucose, Bld 104 (H) 65 - 99 mg/dL   BUN 13 6 - 20 mg/dL   Creatinine, Ser 0.85 0.44 - 1.00 mg/dL   Calcium 9.4 8.9 -  10.3 mg/dL   Total Protein 6.4 (L) 6.5 - 8.1 g/dL   Albumin 3.6 3.5 - 5.0 g/dL   AST 19 15 - 41 U/L   ALT 13 (L) 14 - 54 U/L   Alkaline Phosphatase 77 38 - 126 U/L   Total Bilirubin 0.4 0.3 - 1.2 mg/dL   GFR calc non Af Amer >60 >60 mL/min   GFR calc Af Amer >60 >60 mL/min   Anion gap 7 5 - 15    EKG: Orders placed or performed in visit on 05/28/15  . EKG 12-Lead    Imaging Studies: US Transvaginal Non-ob  06-12-15  GYNECOLOGIC SONOGRAM SHARION ENSLIN is a 82 y.o. she is here for a pelvic sonogram for postmenopausal bleeding . Uterus                      7.7 x 4.3 x 5.2 cm, heterogenous anteverted uterus Endometrium          17.4 mm, symmetrical, complex thickened endometrium Right ovary             1.9 x 1.3 x 1.6 cm, wnl Left ovary                1.6 x 1.1 x 1.3 cm, wnl No free fluid seen Technician Comments: PELVIC US TA/TV: heterogenous anteverted uterus,normal ov's bilat,thickened, complex EEC w/color flow 17.41mm,no free fluid,no pain during ultrasound [employer] 12-Jun-2015 3:01 PM Clinical Impression and recommendations: I  have reviewed the sonogram results above, combined with the patient's current clinical course, below are my impressions and any appropriate recommendations for management based on the sonographic findings. Significantly thickened and complex endometrium, pt on tamoxifen Norma uterus otherwise Normal ovaries Will need thorough endometrial evaluation with hysteroscopy uterine curettage as an outpatient EURE,LUTHER H Jun 12, 2015 3:06 PM   US Pelvis Complete  06/12/2015  GYNECOLOGIC SONOGRAM CHESA MULDROW is a 82 y.o. she is here for a pelvic sonogram for postmenopausal bleeding . Uterus                      7.7 x 4.3 x 5.2 cm, heterogenous anteverted uterus Endometrium          17.4 mm, symmetrical, complex thickened endometrium Right ovary             1.9 x 1.3 x 1.6 cm, wnl Left ovary                1.6 x 1.1 x 1.3 cm, wnl No free fluid seen Technician Comments: PELVIC US TA/TV: heterogenous anteverted uterus,normal ov's bilat,thickened, complex EEC w/color flow 17.70mm,no free fluid,no pain during ultrasound [employer] 12-Jun-2015 3:01 PM Clinical Impression and recommendations: I have reviewed the sonogram results above, combined with the patient's current clinical course, below are my impressions and any appropriate recommendations for management based on the sonographic findings. Significantly thickened and complex endometrium, pt on tamoxifen Norma uterus otherwise Normal ovaries Will need thorough endometrial evaluation with hysteroscopy uterine curettage as an outpatient EURE,LUTHER H 2015/06/12 3:06 PM      Assessment: Postmenopausal bleeding Thickened endometrium, complex   Patient Active Problem List   Diagnosis Date Noted  . Ductal carcinoma in situ (DCIS) of right breast 08/13/2014  . Chronic renal disease, stage 3, moderately decreased glomerular filtration rate (GFR) between 30-59 mL/min/1.73 square meter 08/13/2014  . Urge incontinence 10/08/2012  . Uterovaginal prolapse, complete 10/08/2012   . HYPERTENSION, UNSPECIFIED 10/22/2008  . ARTHRITIS,  CHRONIC 10/22/2008    Plan: Hysteroscopy uterine curettage  EURE,LUTHER H 06/02/2015 8:27 AM

## 2015-06-02 NOTE — Anesthesia Procedure Notes (Signed)
Procedure Name: LMA Insertion Date/Time: 06/02/2015 9:08 AM Performed by: Charmaine Downs Pre-anesthesia Checklist: Patient identified, Emergency Drugs available, Suction available and Patient being monitored Patient Re-evaluated:Patient Re-evaluated prior to inductionOxygen Delivery Method: Circle system utilized Preoxygenation: Pre-oxygenation with 100% oxygen Intubation Type: IV induction Ventilation: Mask ventilation without difficulty LMA: LMA inserted LMA Size: 4.0 Tube type: Oral Number of attempts: 1 Placement Confirmation: breath sounds checked- equal and bilateral and positive ETCO2 Tube secured with: Tape Dental Injury: Teeth and Oropharynx as per pre-operative assessment

## 2015-06-02 NOTE — Anesthesia Preprocedure Evaluation (Signed)
Anesthesia Evaluation  Patient identified by MRN, date of birth, ID band Patient awake    Reviewed: Allergy & Precautions, H&P , NPO status , Patient's Chart, lab work & pertinent test results  Airway Mallampati: II  TM Distance: >3 FB Neck ROM: full    Dental  (+) Teeth Intact   Pulmonary neg pulmonary ROS,    breath sounds clear to auscultation       Cardiovascular hypertension, Pt. on medications  Rhythm:regular Rate:Normal     Neuro/Psych    GI/Hepatic GERD  Medicated,  Endo/Other    Renal/GU Renal InsufficiencyRenal disease     Musculoskeletal   Abdominal   Peds  Hematology   Anesthesia Other Findings   Reproductive/Obstetrics                             Anesthesia Physical Anesthesia Plan  ASA: II  Anesthesia Plan: General   Post-op Pain Management:    Induction: Intravenous  Airway Management Planned: LMA  Additional Equipment:   Intra-op Plan:   Post-operative Plan: Extubation in OR  Informed Consent: I have reviewed the patients History and Physical, chart, labs and discussed the procedure including the risks, benefits and alternatives for the proposed anesthesia with the patient or authorized representative who has indicated his/her understanding and acceptance.     Plan Discussed with:   Anesthesia Plan Comments:         Anesthesia Quick Evaluation

## 2015-06-03 ENCOUNTER — Encounter (HOSPITAL_COMMUNITY): Payer: Self-pay | Admitting: Obstetrics & Gynecology

## 2015-06-09 ENCOUNTER — Ambulatory Visit (INDEPENDENT_AMBULATORY_CARE_PROVIDER_SITE_OTHER): Payer: PPO | Admitting: Obstetrics & Gynecology

## 2015-06-09 ENCOUNTER — Encounter: Payer: Self-pay | Admitting: Obstetrics & Gynecology

## 2015-06-09 ENCOUNTER — Other Ambulatory Visit: Payer: Self-pay | Admitting: Obstetrics & Gynecology

## 2015-06-09 VITALS — BP 122/80 | HR 80 | Wt 155.0 lb

## 2015-06-09 DIAGNOSIS — Z9889 Other specified postprocedural states: Secondary | ICD-10-CM | POA: Diagnosis not present

## 2015-06-09 MED ORDER — TOLTERODINE TARTRATE ER 4 MG PO CP24: 4.0000 mg | ORAL_CAPSULE | Freq: Every day | ORAL | Status: DC

## 2015-06-09 NOTE — Progress Notes (Signed)
Patient ID: Melissa Deleon, female   DOB: [birth year], 82 y.o.   MRN: [id number]  HPI: Patient returns for routine postoperative follow-up having undergone hysteroscopy uterine curettage with removal of multiple endometrial polyps on 06/02/2015.  The patient's immediate postoperative recovery has been unremarkable. Since hospital discharge the patient reports unremarkable.   Current Outpatient Prescriptions: Calcium Carb-Cholecalciferol 600-200 MG-UNIT TABS, Take 1 tablet by mouth daily., Disp: , Rfl:  ketorolac (TORADOL) 10 MG tablet, Take 1 tablet (10 mg total) by mouth every 8 (eight) hours as needed., Disp: 15 tablet, Rfl: 0 lisinopril-hydrochlorothiazide (PRINZIDE,ZESTORETIC) 20-25 MG per tablet, Take 1 tablet by mouth daily., Disp: , Rfl:  pravastatin (PRAVACHOL) 10 MG tablet, Take 1 tablet by mouth daily., Disp: , Rfl: 1 tamoxifen (NOLVADEX) 20 MG tablet, TAKE ONE (1) TABLET EACH DAY, Disp: 30 tablet, Rfl: 3 tolterodine (DETROL LA) 4 MG 24 hr capsule, Take 4 mg by mouth daily., Disp: , Rfl:  HYDROcodone-acetaminophen (NORCO/VICODIN) 5-325 MG tablet, Take 1 tablet by mouth every 6 (six) hours as needed. (Patient not taking: Reported on 06/09/2015), Disp: 30 tablet, Rfl: 0  No current facility-administered medications for this visit.    Blood pressure 122/80, pulse 80, weight 155 lb (70.308 kg).  Physical Exam: Abdomen soft non tender bimanual normal non tender, no bleeding today  Diagnostic Tests:   Pathology: Multiple benign polyps, no hyperplasia or malignancy or atypia noted  Impression: S/P hysteroscopy uterine curettage with removal of multiple endometril polyps, benign Tamoxifen therapy for DCIS  Plan: Will send note to Orland Mustard so he and Dr Whitney Muse can consider whether to continue her tamoxifen or not.  Actually her polyps were more than likely not caused by the tamoxifen and the decision to continue would be based solely on her breast DCIS indication  Follow  up: 1 months  Florian Buff, MD

## 2015-06-10 ENCOUNTER — Other Ambulatory Visit (HOSPITAL_COMMUNITY): Payer: Self-pay | Admitting: Oncology

## 2015-07-01 NOTE — Assessment & Plan Note (Addendum)
DCIS of the right breast, ER + and PR negative. Total tumor dimension was 0.2 cm. There was evidence of atypical ductal hyperplasia. She has done very well with lumpectomy on 06/10/2014 by Dr. Arnoldo Morale. She has opted not to pursue radiation therapy but was very interested in trying endocrine therapy. Therefore, she started Tamoxifen on 07/17/2014 but this was complicated in Jan 0000000 with vaginal bleeding resulting in Tamoxifen being placed on hold on 05/24/2015 for Gynecologic work-up.  Gynecologic work-up demonstrated endometrial polyps requiring hysteroscopy uterine curettage with removal of multiple polyps.  Pathology was negative for atypia or malignancy. She restarted tamoxifen on her own on approximately 06/25/2015.  I reviewed the risks, benefits, alternatives, and side effects of Tamoxifen therapy, including, but not limited to, hot flashes, arthralgias, myalgias, VTE, secondary malignancy.  I reviewed the benefits of Tamoxifen in the setting of malignancy-prevention in the setting of DCIS.I reviewed the risks, benefits, alternatives, and side effects of Tamoxifen therapy, including, but not limited to, hot flashes, arthralgias, myalgias, VTE, secondary malignancy.  I reviewed the benefits of Tamoxifen in the setting of malignancy-prevention in the setting of DCIS.  No role for labs today.  With a long discussion regarding future prophylactic treatment.  She admits that she restart her tamoxifen about one and a half weeks ago. She did not call us regarding this. She is very interesting and restarting tamoxifen. It would not be unreasonable to discontinue tamoxifen permanently given her age and history of DCIS only. However, she once to pursue further tamoxifen. She strongly advised to call the clinic immediately if she has vaginal bleeding. She is to stop her tamoxifen if vaginal bleeding recurs. She will reach out to her gynecologist for an appointment if bleeding is noted.  Labs in 3 months: CBC with  differential, complete metabolic panel  Return in 3 months for follow-up.

## 2015-07-01 NOTE — Progress Notes (Signed)
Melissa Deleon, [place] [id number]  Ductal carcinoma in situ (DCIS) of right breast - Plan: CBC with Differential, Comprehensive metabolic panel  CURRENT THERAPY: Tamoxifen on old due to vaginal bleeding requiring Gyn intervention.  Tamoxifen was started on 07/17/2014 and placed on hold on 05/24/2015.  INTERVAL HISTORY: Melissa Deleon 82 y.o. female returns for followup of DCIS of the right breast, ER + and PR negative. Total tumor dimension was 0.2 cm. There was evidence of atypical ductal hyperplasia. She has done very well with lumpectomy on 06/10/2014 by Dr. Arnoldo Morale. She has opted not to pursue radiation therapy but was very interested in trying endocrine therapy. Therefore, she started Tamoxifen on 07/17/2014 but this was complicated in Jan 0000000 with vaginal bleeding resulting in Tamoxifen being placed on hold on 05/24/2015 for Gynecologic work-up.  Gynecologic work-up demonstrated endometrial polyps requiring hysteroscopy uterine curettage with removal of multiple polyps.  Pathology was negative for atypia or malignancy.  I personally reviewed and went over pathology results with the patient.  I reviewed the risks, benefits, alternatives, and side effects of Tamoxifen therapy, including, but not limited to, hot flashes, arthralgias, myalgias, VTE, secondary malignancy.  I reviewed the benefits of Tamoxifen in the setting of malignancy-prevention in the setting of DCIS.  We had a very long discussion regarding the role of tamoxifen in this setting. She is adamant that she wants to restart tamoxifen knowing the aforementioned risks.  As a matter of fact, she restarted tamoxifen approximately on 06/25/2015.  Since undergoing uterine curettage she has not had any vaginal bleeding. She has upcoming follow-up with Dr. Elonda Husky, gynecology.  Past Medical History  Diagnosis Date  . Hypertension   . GERD (gastroesophageal reflux disease)   . Ductal carcinoma in  situ (DCIS) of right breast 08/13/2014  . Chronic renal disease, stage 3, moderately decreased glomerular filtration rate (GFR) between 30-59 mL/min/1.73 square meter 08/13/2014  . Breast cancer ([place]) 05/2014    has HYPERTENSION, UNSPECIFIED; ARTHRITIS, CHRONIC; Urge incontinence; Uterovaginal prolapse, complete; Ductal carcinoma in situ (DCIS) of right breast; and Chronic renal disease, stage 3, moderately decreased glomerular filtration rate (GFR) between 30-59 mL/min/1.73 square meter on her problem list.     is allergic to sulfa antibiotics.  Melissa Deleon does not currently have medications on file.  Past Surgical History  Procedure Laterality Date  . No past surgeries      RING PLACED TO HOLD BLADDER   . Lumbar laminectomy/decompression microdiscectomy  03/07/2012    Procedure: LUMBAR LAMINECTOMY/DECOMPRESSION MICRODISCECTOMY 1 LEVEL;  Surgeon: Otilio Connors, MD;  Location: [facility];  Service: Neurosurgery;  Laterality: Left;  Left Lumbar four-five Laminectomy for Synovial cyst  . Bladder ring    . Back surgery    . Kyphoplasty N/A 06/11/2012    Procedure: KYPHOPLASTY (C-Arm x 2);  Surgeon: Otilio Connors, MD;  Location: [facility];  Service: Neurosurgery;  Laterality: N/A;  Lumbar two Kyphoplasty  . Breast biopsy Right 06/10/2014    Procedure: RIGHT BREAST BIOPSY AFTER NEEDLE LOCALIZATION;  Surgeon: Jamesetta So, MD;  Location: [facility];  Service: General;  Laterality: Right;  . Breast lumpectomy Right 06/2014  . Hysteroscopy w/d&c N/A 06/02/2015    Procedure: DILATATION AND CURETTAGE /HYSTEROSCOPY;  Surgeon: Florian Buff, MD;  Location: [facility];  Service: Gynecology;  Laterality: N/A;  . Polypectomy N/A 06/02/2015    Procedure: Removal of endometrial Polyps;  Surgeon: Florian Buff,  MD;  Location: [facility];  Service: Gynecology;  Laterality: N/A;    Denies any headaches, dizziness, double vision, fevers, chills, night sweats, nausea, vomiting, diarrhea, constipation, chest pain, heart  palpitations, shortness of breath, blood in stool, black tarry stool, urinary pain, urinary burning, urinary frequency, hematuria.   PHYSICAL EXAMINATION  ECOG PERFORMANCE STATUS: 0 - Asymptomatic  Filed Vitals:   07/02/15 1003  BP: 139/70  Pulse: 84  Temp: 98.4 F (36.9 C)  Resp: 18    GENERAL:alert, no distress, well nourished, well developed, comfortable, cooperative and smiling, Unaccompanied SKIN: skin color, texture, turgor are normal, no rashes or significant lesions HEAD: Normocephalic, No masses, lesions, tenderness or abnormalities EYES: normal, PERRLA, EOMI, Conjunctiva are pink and non-injected EARS: External ears normal OROPHARYNX:lips, buccal mucosa, and tongue normal and mucous membranes are moist  NECK: supple, trachea midline LYMPH:  Not examined BREAST: Not examined LUNGS: Not examined HEART: Not examined  ABDOMEN: Not examined  BACK: Back symmetric, no curvature., No CVA tenderness EXTREMITIES:less then 2 second capillary refill, no joint deformities, effusion, or inflammation, no skin discoloration, no clubbing, no cyanosis. NEURO: alert & oriented x 3 with fluent speech, no focal motor/sensory deficits, gait normal   LABORATORY DATA: CBC    Component Value Date/Time   WBC 4.8 05/28/2015 0830   RBC 4.36 05/28/2015 0830   HGB 12.8 05/28/2015 0830   HCT 40.0 05/28/2015 0830   PLT 254 05/28/2015 0830   MCV 91.7 05/28/2015 0830   MCH 29.4 05/28/2015 0830   MCHC 32.0 05/28/2015 0830   RDW 12.3 05/28/2015 0830   LYMPHSABS 2.4 05/17/2015 1220   MONOABS 0.5 05/17/2015 1220   EOSABS 0.3 05/17/2015 1220   BASOSABS 0.1 05/17/2015 1220      Chemistry      Component Value Date/Time   NA 139 05/28/2015 0830   K 4.2 05/28/2015 0830   CL 102 05/28/2015 0830   CO2 30 05/28/2015 0830   BUN 13 05/28/2015 0830   CREATININE 0.85 05/28/2015 0830      Component Value Date/Time   CALCIUM 9.4 05/28/2015 0830   ALKPHOS 77 05/28/2015 0830   AST 19 05/28/2015  0830   ALT 13* 05/28/2015 0830   BILITOT 0.4 05/28/2015 0830       ASSESSMENT AND PLAN:  Ductal carcinoma in situ (DCIS) of right breast DCIS of the right breast, ER + and PR negative. Total tumor dimension was 0.2 cm. There was evidence of atypical ductal hyperplasia. She has done very well with lumpectomy on 06/10/2014 by Dr. Arnoldo Morale. She has opted not to pursue radiation therapy but was very interested in trying endocrine therapy. Therefore, she started Tamoxifen on 07/17/2014 but this was complicated in Jan 0000000 with vaginal bleeding resulting in Tamoxifen being placed on hold on 05/24/2015 for Gynecologic work-up.  Gynecologic work-up demonstrated endometrial polyps requiring hysteroscopy uterine curettage with removal of multiple polyps.  Pathology was negative for atypia or malignancy. She restarted tamoxifen on her own on approximately 06/25/2015.  I reviewed the risks, benefits, alternatives, and side effects of Tamoxifen therapy, including, but not limited to, hot flashes, arthralgias, myalgias, VTE, secondary malignancy.  I reviewed the benefits of Tamoxifen in the setting of malignancy-prevention in the setting of DCIS.I reviewed the risks, benefits, alternatives, and side effects of Tamoxifen therapy, including, but not limited to, hot flashes, arthralgias, myalgias, VTE, secondary malignancy.  I reviewed the benefits of Tamoxifen in the setting of malignancy-prevention in the setting of DCIS.  No role for labs  today.  With a long discussion regarding future prophylactic treatment.  She admits that she restart her tamoxifen about one and a half weeks ago. She did not call us regarding this. She is very interesting and restarting tamoxifen. It would not be unreasonable to discontinue tamoxifen permanently given her age and history of DCIS only. However, she once to pursue further tamoxifen. She strongly advised to call the clinic immediately if she has vaginal bleeding. She is to stop her  tamoxifen if vaginal bleeding recurs. She will reach out to her gynecologist for an appointment if bleeding is noted.  Labs in 3 months: CBC with differential, complete metabolic panel  Return in 3 months for follow-up.    THERAPY PLAN:  NCCN guidelines recommends the following for DCIS postsurgical treatment:  A. Risk reduction therapy for ipsilateral breast-conserving surgery:   1. Consider endocrine therapy for 5 years:    A. Patient treated with breast-conserving therapy (lumpectomy) and radiation therapy (category 1), especially for those with ER-positive DCIS    B. The benefit of endocrine therapy for ER-negative DCIS is uncertain    C. Patients treated with excision alone   2. Endocrine therapy:    A. Tamoxifen for premenopausal patients    B. Tamoxifen or aromatase inhibitor for postmenopausal patients with some advantage for aromatase inhibitor therapy in patients < 51 years old or with concerns for thromboembolism  B. Risk reduction therapy for contralateral breast   1. Counseling regarding risk reduction.  NCCN guidelines recommends the following surveillance for DCIS of breast:  1. Interval history and physical exam every 6-12 months for 5 years, then annually.  2. Mammogram every 12 months (and 6-12 months postradiation therapy if breast conserved [Category 2B]).  3. If treated with Tamoxifen, monitor per NCCN guidelines for Breast Cancer Risk Reduction.    All questions were answered. The patient knows to call the clinic with any problems, questions or concerns. We can certainly see the patient much sooner if necessary.  Patient and plan discussed with Dr. Ancil Linsey and she is in agreement with the aforementioned.   This note is electronically signed by: Robynn Pane 07/02/2015 4:19 PM

## 2015-07-02 ENCOUNTER — Encounter (HOSPITAL_COMMUNITY): Payer: PPO | Attending: Oncology | Admitting: Oncology

## 2015-07-02 VITALS — BP 139/70 | HR 84 | Temp 98.4°F | Resp 18 | Wt 155.1 lb

## 2015-07-02 DIAGNOSIS — D0511 Intraductal carcinoma in situ of right breast: Secondary | ICD-10-CM | POA: Diagnosis not present

## 2015-07-02 DIAGNOSIS — Z170 Estrogen receptor positive status [ER+]: Secondary | ICD-10-CM

## 2015-07-02 NOTE — Patient Instructions (Signed)
We discussed options regarding Tamoxifen.  You have decided to pursue daily Tamoxifen again given a negative gynecologic work-up by Dr. Elonda Husky with negative pathology for malignancy.  You started Tamoxifen about 1.5 weeks ago.  Continue to take it daily.  The risks and side effects associated with Tamoxifen include:  Joint aches  Muscle aches  Hot flashes  Increased risk for blood clots  Risk of secondary malignancy The role of Tamoxifen is to reduce the risk of breast cancer in both breasts.  You will return in 3 months for labs work and follow-up appointment.  If vaginal bleeding develops, please STOP TAMOXIFEN and call [facility] and Dr. Brynda Greathouse office for an appointment.  Robynn Pane, PA-C 07/02/2015 10:39 AM

## 2015-07-07 IMAGING — MG MM DIGITAL DIAGNOSTIC UNILAT*R*
2 series · 2 of 2 positions shown · non-contrast
Comparison: Previous examinations, including the screening
mammogram dated 03/02/2014.

CLINICAL DATA: Right breast calcifications at recent screening
mammography.

EXAM:
DIGITAL DIAGNOSTIC  RIGHT MAMMOGRAM

[R CC]
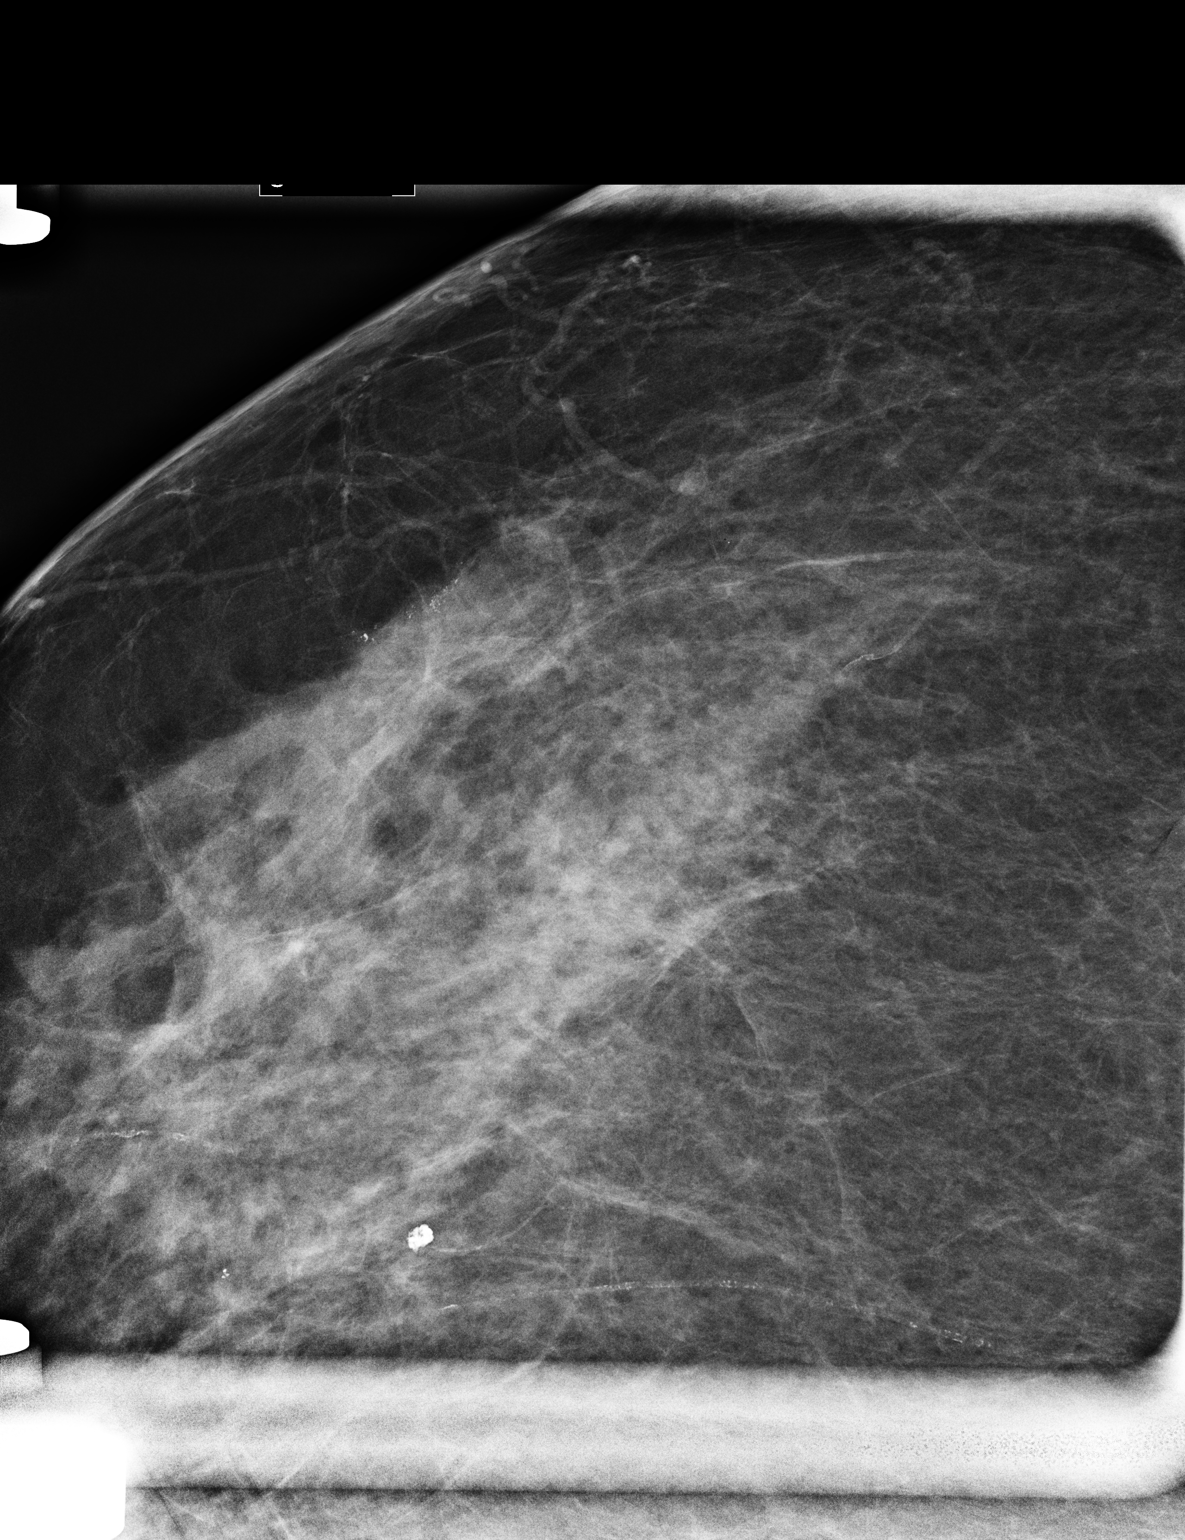

[R ML]
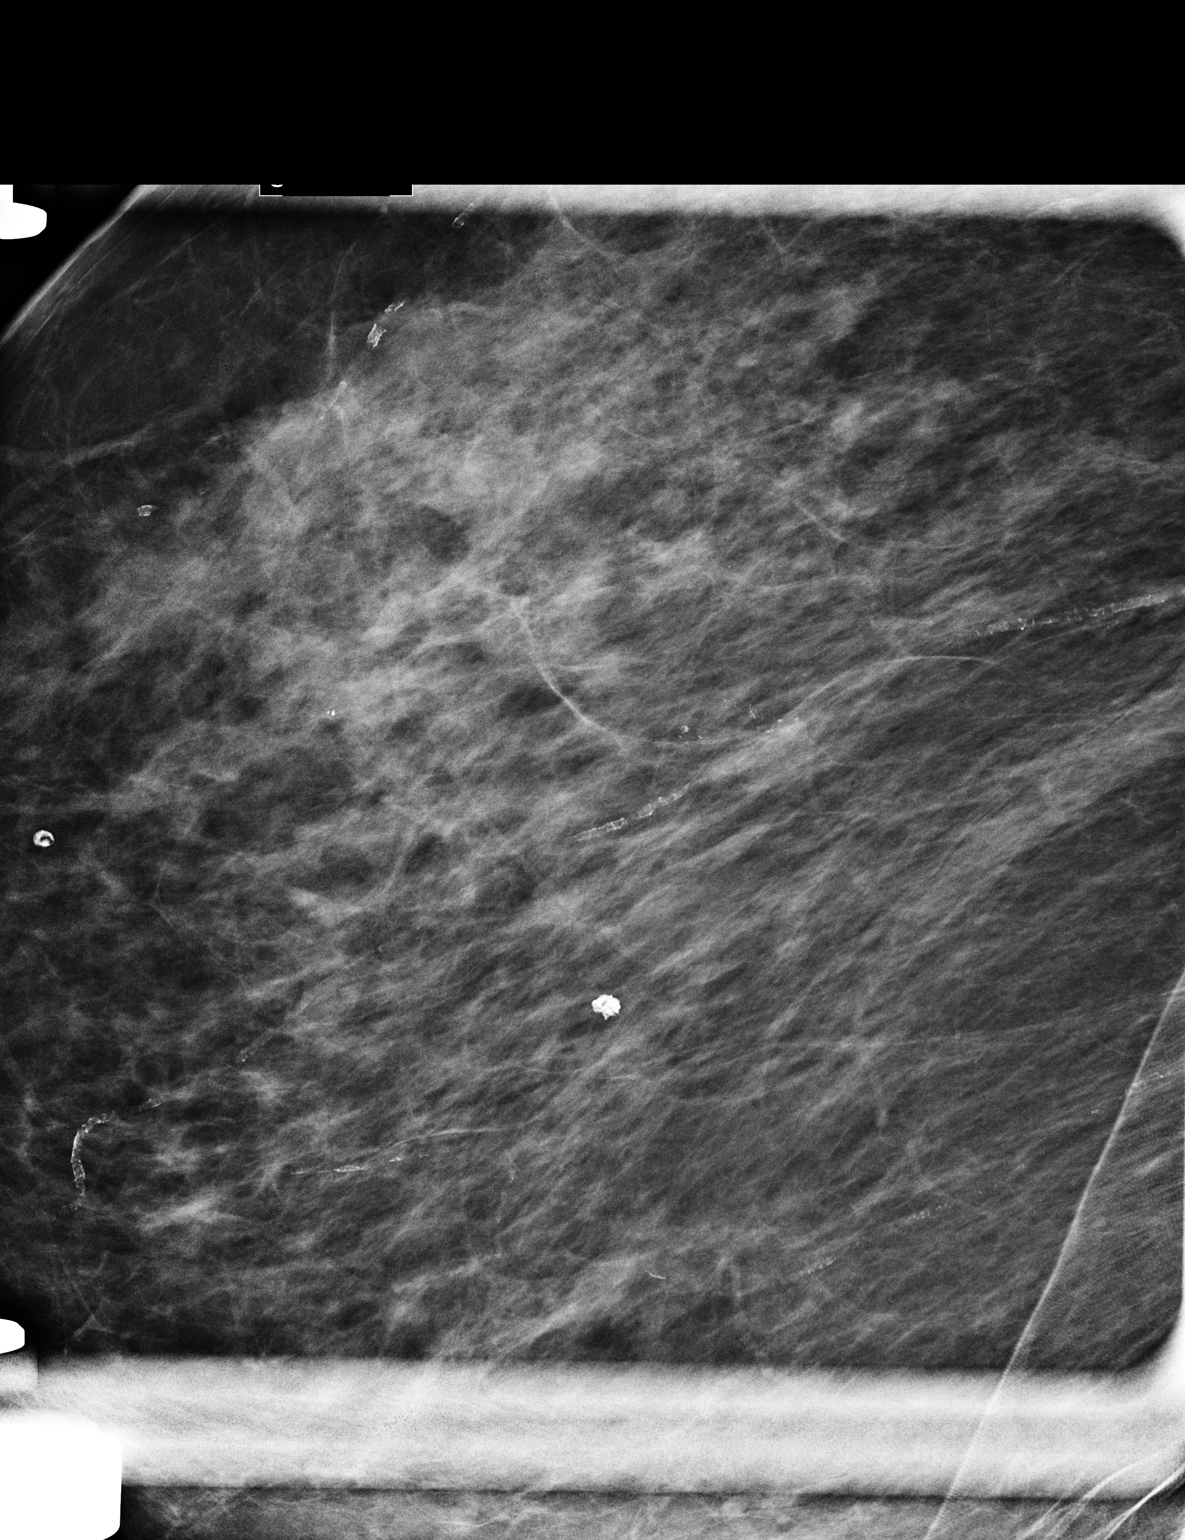

[2 of 2 positions shown; findings below may reference images not displayed]

ACR Breast Density Category c: The breast tissue is heterogeneously
dense, which may obscure small masses.
FINDINGS: Spot magnification views of the right breast demonstrate a 13 x 2 x
2 mm group of a large number of tiny microcalcifications arranged in
a linear fashion in the upper-outer quadrant of the breast. The
calcifications vary in size, shape and density.
IMPRESSION: 13 mm group of microcalcifications in the upper outer right breast
with mammographic features suspicious for the possibility of ductal
carcinoma in situ. Fibroadenomatoid changes are another possibility.

RECOMMENDATION:
Right breast stereotactic guided core needle biopsy. This has been
scheduled for [DATE] a.m. on 04/01/2014 at the [REDACTED].

I have discussed the findings and recommendations with the patient.
Results were also provided in writing at the conclusion of the
visit. If applicable, a reminder letter will be sent to the patient
regarding the next appointment.

BI-RADS CATEGORY  4: Suspicious.

## 2015-07-08 ENCOUNTER — Ambulatory Visit (INDEPENDENT_AMBULATORY_CARE_PROVIDER_SITE_OTHER): Payer: PPO | Admitting: Obstetrics & Gynecology

## 2015-07-08 ENCOUNTER — Encounter: Payer: Self-pay | Admitting: Obstetrics & Gynecology

## 2015-07-08 VITALS — BP 128/70 | HR 76 | Wt 156.0 lb

## 2015-07-08 DIAGNOSIS — N813 Complete uterovaginal prolapse: Secondary | ICD-10-CM | POA: Diagnosis not present

## 2015-07-08 NOTE — Progress Notes (Signed)
Patient ID: [name], female   DOB: 1933-11-21, 82 y.o.   MRN: [id number] Patient ID: [name], female   DOB: [birth year], 82 y.o.   MRN: [id number] Chief Complaint  Patient presents with  . Follow-up    Blood pressure 128/70, pulse 76, weight 156 lb (70.761 kg).  Melissa Deleon presents today for routine follow up related to her pessary.   She uses a milex ring with support #3 She reports little vaginal discharge or vaginal bleeding.  Exam reveals no undue vaginal mucosal pressure of breakdown, no discharge and no vaginal bleeding.  The pessary is removed, cleaned and replaced without difficulty.      Melissa Deleon will be sen back in 4 months for continued follow up.   Florian Buff, MD   07/08/2015 10:32 AM

## 2015-08-17 ENCOUNTER — Other Ambulatory Visit (HOSPITAL_COMMUNITY): Payer: PPO

## 2015-08-17 ENCOUNTER — Ambulatory Visit (HOSPITAL_COMMUNITY): Payer: PPO | Admitting: Hematology & Oncology

## 2015-09-24 ENCOUNTER — Encounter (HOSPITAL_COMMUNITY): Payer: PPO | Attending: Oncology | Admitting: Hematology & Oncology

## 2015-09-24 ENCOUNTER — Encounter (HOSPITAL_COMMUNITY): Payer: Self-pay | Admitting: Hematology & Oncology

## 2015-09-24 ENCOUNTER — Encounter (HOSPITAL_COMMUNITY): Payer: PPO

## 2015-09-24 VITALS — BP 131/67 | HR 72 | Temp 99.0°F | Resp 16 | Wt 154.2 lb

## 2015-09-24 DIAGNOSIS — N950 Postmenopausal bleeding: Secondary | ICD-10-CM

## 2015-09-24 DIAGNOSIS — D0511 Intraductal carcinoma in situ of right breast: Secondary | ICD-10-CM

## 2015-09-24 DIAGNOSIS — Z170 Estrogen receptor positive status [ER+]: Secondary | ICD-10-CM

## 2015-09-24 DIAGNOSIS — N939 Abnormal uterine and vaginal bleeding, unspecified: Secondary | ICD-10-CM

## 2015-09-24 LAB — CBC WITH DIFFERENTIAL/PLATELET
BASOS PCT: 1 %
Basophils Absolute: 0.1 10*3/uL (ref 0.0–0.1)
EOS ABS: 0.3 10*3/uL (ref 0.0–0.7)
Eosinophils Relative: 5 %
HEMATOCRIT: 38 % (ref 36.0–46.0)
Hemoglobin: 12.6 g/dL (ref 12.0–15.0)
LYMPHS ABS: 2.3 10*3/uL (ref 0.7–4.0)
Lymphocytes Relative: 40 %
MCH: 30.1 pg (ref 26.0–34.0)
MCHC: 33.2 g/dL (ref 30.0–36.0)
MCV: 90.7 fL (ref 78.0–100.0)
MONO ABS: 0.5 10*3/uL (ref 0.1–1.0)
MONOS PCT: 8 %
Neutro Abs: 2.7 10*3/uL (ref 1.7–7.7)
Neutrophils Relative %: 46 %
Platelets: 252 10*3/uL (ref 150–400)
RBC: 4.19 MIL/uL (ref 3.87–5.11)
RDW: 12.8 % (ref 11.5–15.5)
WBC: 5.8 10*3/uL (ref 4.0–10.5)

## 2015-09-24 LAB — COMPREHENSIVE METABOLIC PANEL
ALK PHOS: 79 U/L (ref 38–126)
ALT: 12 U/L — AB (ref 14–54)
ANION GAP: 7 (ref 5–15)
AST: 20 U/L (ref 15–41)
Albumin: 3.9 g/dL (ref 3.5–5.0)
BILIRUBIN TOTAL: 0.5 mg/dL (ref 0.3–1.2)
BUN: 21 mg/dL — ABNORMAL HIGH (ref 6–20)
CALCIUM: 9.3 mg/dL (ref 8.9–10.3)
CO2: 26 mmol/L (ref 22–32)
CREATININE: 1.19 mg/dL — AB (ref 0.44–1.00)
Chloride: 103 mmol/L (ref 101–111)
GFR, EST AFRICAN AMERICAN: 49 mL/min — AB (ref >60)
GFR, EST NON AFRICAN AMERICAN: 42 mL/min — AB (ref >60)
Glucose, Bld: 99 mg/dL (ref 65–99)
Potassium: 4 mmol/L (ref 3.5–5.1)
Sodium: 136 mmol/L (ref 135–145)
TOTAL PROTEIN: 7.1 g/dL (ref 6.5–8.1)

## 2015-09-24 NOTE — Patient Instructions (Signed)
[place] at [facility] Discharge Instructions  RECOMMENDATIONS MADE BY THE CONSULTANT AND ANY TEST RESULTS WILL BE SENT TO YOUR REFERRING PHYSICIAN.  Return to clinic in 3 months with Gershon Mussel  STOP Tamoxifen  Vaginal Ultrasound - ordered   Thank you for choosing [place] at [facility] to provide your oncology and hematology care.  To afford each patient quality time with our provider, please arrive at least 15 minutes before your scheduled appointment time.   Beginning January 23rd 2017 lab work for the [facility] will be done in the  Main lab at [facility] on 1st floor. If you have a lab appointment with the [place] please come in thru the  Main Entrance and check in at the main information desk  You need to re-schedule your appointment should you arrive 10 or more minutes late.  We strive to give you quality time with our providers, and arriving late affects you and other patients whose appointments are after yours.  Also, if you no show three or more times for appointments you may be dismissed from the clinic at the providers discretion.     Again, thank you for choosing [facility].  Our hope is that these requests will decrease the amount of time that you wait before being seen by our physicians.       _____________________________________________________________  Should you have questions after your visit to [facility], please contact our office at [phone] between the hours of 8:30 a.m. and 4:30 p.m.  Voicemails left after 4:30 p.m. will not be returned until the following business day.  For prescription refill requests, have your pharmacy contact our office.         Resources For Cancer Patients and their Caregivers ? American Cancer Society: Can assist with transportation, wigs, general needs, runs Look Good Feel Better.        513 754 5185 ? Cancer Care: Provides financial  assistance, online support groups, medication/co-pay assistance.  1-800-813-HOPE 563-123-1969) ? [place] Assists [place] cancer patients and their families through emotional , educational and financial support.  954-442-0810 ? [place] DSS Where to apply for food stamps, Medicaid and utility assistance. 602-313-3959 ? [facility]: Transportation to medical appointments. (332)485-2817 ? Social Security Administration: May apply for disability if have a Stage IV cancer. (605)848-2401 (647)340-1758 ? [employer], Disability and Transit Services: Assists with nutrition, care and transit needs. [place] Support Programs: @10RELATIVEDAYS @ > Cancer Support Group  2nd Tuesday of the month 1pm-2pm, Journey Room  > Creative Journey  3rd Tuesday of the month 1130am-1pm, Journey Room  > Look Good Feel Better  1st Wednesday of the month 10am-12 noon, Journey Room (Call [place] to register [phone])

## 2015-09-24 NOTE — Progress Notes (Signed)
.   [facility] Progress Note  Patient Care Team: Sharilyn Sites, MD as PCP - General (Family Medicine)  CHIEF COMPLAINTS/PURPOSE OF CONSULTATION:  Abnormal  Mammogram of the R breast R breast biopsy after needle localization with Dr.Jenkins on 06/10/2014 DCIS ER+ (100%), PR- (0%), intermediate grade 0.2cm, focal atypical ductal hyperplasia pTis Nx Osteopenia on DEXA dated 10/06/2011  HISTORY OF PRESENTING ILLNESS:  Melissa Deleon 82 y.o. female is here for follow-up of DCIS of the R breast. She went for routine screening mammogram in November 2015 that showed abnormal calcifications in the right breast. She was ultimately referred to Dr. Arnoldo Morale and has since undergone definitive surgical resection with good margins.   She is very independent, she lives alone. She has a large family. She has a good social support system. She enjoys yardwork and mows her grass. She enjoys cooking and gardening. She feels she has minimal physical limitations.  She started Tamoxifen on 07/17/2014 but this was complicated in Jan 0071 with vaginal bleeding resulting in Tamoxifen being placed on hold on 05/24/2015 for Gynecologic work-up. Gynecologic work-up demonstrated endometrial polyps requiring hysteroscopy uterine curettage with removal of multiple polyps. Pathology was negative for atypia or malignancy.  She notes that she had a mammogram done in December 2016 up here at [facility].   She notes that she continues to experience vaginal bleeding once in a while. She notes "why, I don't know." She says "it's just once in a while, and then it goes away."  She sees Dr. Elonda Husky again on 11/08/2015.  When asked what she's got planned for the summer, she says "mowing grass, I guess!"  During the physical exam, she notes that the last time she had vaginal spotting was the day before yesterday.   MEDICAL HISTORY:  Past Medical History  Diagnosis Date  . Hypertension   . GERD (gastroesophageal reflux  disease)   . Ductal carcinoma in situ (DCIS) of right breast 08/13/2014  . Chronic renal disease, stage 3, moderately decreased glomerular filtration rate (GFR) between 30-59 mL/min/1.73 square meter 08/13/2014  . Breast cancer ([place]) 05/2014    SURGICAL HISTORY: Past Surgical History  Procedure Laterality Date  . No past surgeries      RING PLACED TO HOLD BLADDER   . Lumbar laminectomy/decompression microdiscectomy  03/07/2012    Procedure: LUMBAR LAMINECTOMY/DECOMPRESSION MICRODISCECTOMY 1 LEVEL;  Surgeon: Otilio Connors, MD;  Location: [facility]URO ORS;  Service: Neurosurgery;  Laterality: Left;  Left Lumbar four-five Laminectomy for Synovial cyst  . Bladder ring    . Back surgery    . Kyphoplasty N/A 06/11/2012    Procedure: KYPHOPLASTY (C-Arm x 2);  Surgeon: Otilio Connors, MD;  Location: [facility] NEURO ORS;  Service: Neurosurgery;  Laterality: N/A;  Lumbar two Kyphoplasty  . Breast biopsy Right 06/10/2014    Procedure: RIGHT BREAST BIOPSY AFTER NEEDLE LOCALIZATION;  Surgeon: Jamesetta So, MD;  Location: [facility] ORS;  Service: General;  Laterality: Right;  . Breast lumpectomy Right 06/2014  . Hysteroscopy w/d&c N/A 06/02/2015    Procedure: DILATATION AND CURETTAGE /HYSTEROSCOPY;  Surgeon: Florian Buff, MD;  Location: [facility] ORS;  Service: Gynecology;  Laterality: N/A;  . Polypectomy N/A 06/02/2015    Procedure: Removal of endometrial Polyps;  Surgeon: Florian Buff, MD;  Location: [facility];  Service: Gynecology;  Laterality: N/A;    SOCIAL HISTORY: Social History   Social History  . Marital Status: Widowed    Spouse Name: N/A  . Number  of Children: N/A  . Years of Education: N/A   Occupational History  . Not on file.   Social History Main Topics  . Smoking status: Never Smoker   . Smokeless tobacco: Never Used  . Alcohol Use: No  . Drug Use: No  . Sexual Activity: Not Currently    Birth Control/ Protection: Post-menopausal   Other Topics Concern  . Not on file   Social History Narrative    she does not smoke, is a never smoker. She is very active. She is widowed. She has an excellent support system in her family and friends.  FAMILY HISTORY: Family History  Problem Relation Age of Onset  . Diabetes Mother   . Heart attack Father   . Cancer Sister     breast   indicated that her mother is deceased. She indicated that her father is deceased. She indicated that her sister is deceased. She indicated that her brother is alive.     ALLERGIES:  is allergic to sulfa antibiotics.  MEDICATIONS:  Current Outpatient Prescriptions  Medication Sig Dispense Refill  . Calcium Carb-Cholecalciferol 600-200 MG-UNIT TABS Take 1 tablet by mouth daily.    Marland Kitchen lisinopril-hydrochlorothiazide (PRINZIDE,ZESTORETIC) 20-25 MG per tablet Take 1 tablet by mouth daily.    . pravastatin (PRAVACHOL) 10 MG tablet Take 1 tablet by mouth daily.  1  . tamoxifen (NOLVADEX) 20 MG tablet TAKE ONE (1) TABLET EACH DAY 30 tablet 3  . tolterodine (DETROL LA) 4 MG 24 hr capsule Take 1 capsule (4 mg total) by mouth daily. 30 capsule 11   No current facility-administered medications for this visit.    Review of Systems  Constitutional: Negative for fever, chills, weight loss and malaise/fatigue.  HENT: Negative for congestion, hearing loss, nosebleeds, sore throat and tinnitus.   Eyes: Negative for blurred vision, double vision, pain and discharge.  Respiratory: Negative for cough, hemoptysis, sputum production, shortness of breath and wheezing.   Cardiovascular: Negative for chest pain, palpitations, claudication, leg swelling and PND.  Gastrointestinal: Negative for heartburn, nausea, vomiting, abdominal pain, diarrhea, constipation, blood in stool and melena.  Genitourinary: Negative for dysuria, urgency, frequency and hematuria.  Musculoskeletal: Negative for myalgias, joint pain and falls.  Skin: Negative for itching and rash.  Neurological: Negative for dizziness, tingling, tremors, sensory change, speech  change, focal weakness, seizures, loss of consciousness, weakness and headaches.  Endo/Heme/Allergies: Does not bruise/bleed easily.  Psychiatric/Behavioral: Negative for depression, suicidal ideas, memory loss and substance abuse. The patient is not nervous/anxious and does not have insomnia.    14 point review of systems was performed and is negative except as detailed under history of present illness and above   PHYSICAL EXAMINATION: ECOG PERFORMANCE STATUS: 0 - Asymptomatic  Filed Vitals:   09/24/15 1118  BP: 131/67  Pulse: 72  Temp: 99 F (37.2 C)  Resp: 16   Filed Weights   09/24/15 1118  Weight: 154 lb 3.2 oz (69.945 kg)     Physical Exam  Constitutional: She is oriented to person, place, and time and well-developed, well-nourished, and in no distress.  HENT:  Head: Normocephalic and atraumatic.  Nose: Nose normal.  Mouth/Throat: Oropharynx is clear and moist. No oropharyngeal exudate.  Eyes: Conjunctivae and EOM are normal. Pupils are equal, round, and reactive to light. Right eye exhibits no discharge. Left eye exhibits no discharge. No scleral icterus.  Neck: Normal range of motion. Neck supple. No tracheal deviation present. No thyromegaly present.  Cardiovascular: Normal rate, regular rhythm and  normal heart sounds.  Exam reveals no gallop and no friction rub.   No murmur heard. Pulmonary/Chest: Effort normal and breath sounds normal. She has no wheezes. She has no rales.  Abdominal: Soft. Bowel sounds are normal. She exhibits no distension and no mass. There is no tenderness. There is no rebound and no guarding.  Musculoskeletal: Normal range of motion. She exhibits no edema.  Lymphadenopathy:    She has no cervical adenopathy.  Neurological: She is alert and oriented to person, place, and time. She has normal reflexes. No cranial nerve deficit. Gait normal. Coordination normal.  Skin: Skin is warm and dry. No rash noted.  Psychiatric: Mood, memory, affect and  judgment normal.  Nursing note and vitals reviewed.    LABORATORY DATA:  I have reviewed the data as listed Lab Results  Component Value Date   WBC 5.8 09/24/2015   HGB 12.6 09/24/2015   HCT 38.0 09/24/2015   MCV 90.7 09/24/2015   PLT 252 09/24/2015     Chemistry      Component Value Date/Time   NA 136 09/24/2015 1023   K 4.0 09/24/2015 1023   CL 103 09/24/2015 1023   CO2 26 09/24/2015 1023   BUN 21* 09/24/2015 1023   CREATININE 1.19* 09/24/2015 1023      Component Value Date/Time   CALCIUM 9.3 09/24/2015 1023   ALKPHOS 79 09/24/2015 1023   AST 20 09/24/2015 1023   ALT 12* 09/24/2015 1023   BILITOT 0.5 09/24/2015 1023     Results for Melissa Deleon, Melissa Deleon (MRN [id number]) as of 09/24/2015 11:33  Ref. Range 09/24/2015 10:23  Sodium Latest Ref Range: 135-145 mmol/L 136  Potassium Latest Ref Range: 3.5-5.1 mmol/L 4.0  Chloride Latest Ref Range: 101-111 mmol/L 103  CO2 Latest Ref Range: 22-32 mmol/L 26  BUN Latest Ref Range: 6-20 mg/dL 21 (H)  Creatinine Latest Ref Range: 0.44-1.00 mg/dL 1.19 (H)  Calcium Latest Ref Range: 8.9-10.3 mg/dL 9.3  EGFR (Non-African Amer.) Latest Ref Range: >60 mL/min 42 (L)  EGFR (African American) Latest Ref Range: >60 mL/min 49 (L)  Glucose Latest Ref Range: 65-99 mg/dL 99  Anion gap Latest Ref Range: 5-15  7  Alkaline Phosphatase Latest Ref Range: 38-126 U/L 79  Albumin Latest Ref Range: 3.5-5.0 g/dL 3.9  AST Latest Ref Range: 15-41 U/L 20  ALT Latest Ref Range: 14-54 U/L 12 (L)  Total Protein Latest Ref Range: 6.5-8.1 g/dL 7.1  Total Bilirubin Latest Ref Range: 0.3-1.2 mg/dL 0.5  WBC Latest Ref Range: 4.0-10.5 K/uL 5.8  RBC Latest Ref Range: 3.87-5.11 MIL/uL 4.19  Hemoglobin Latest Ref Range: 12.0-15.0 g/dL 12.6  HCT Latest Ref Range: 36.0-46.0 % 38.0  MCV Latest Ref Range: 78.0-100.0 fL 90.7  MCH Latest Ref Range: 26.0-34.0 pg 30.1  MCHC Latest Ref Range: 30.0-36.0 g/dL 33.2  RDW Latest Ref Range: 11.5-15.5 % 12.8  Platelets Latest Ref  Range: 150-400 K/uL 252  Neutrophils Latest Units: % 46  Lymphocytes Latest Units: % 40  Monocytes Relative Latest Units: % 8  Eosinophil Latest Units: % 5  Basophil Latest Units: % 1  NEUT# Latest Ref Range: 1.7-7.7 K/uL 2.7  Lymphocyte # Latest Ref Range: 0.7-4.0 K/uL 2.3  Monocyte # Latest Ref Range: 0.1-1.0 K/uL 0.5  Eosinophils Absolute Latest Ref Range: 0.0-0.7 K/uL 0.3  Basophils Absolute Latest Ref Range: 0.0-0.1 K/uL 0.1    RADIOGRAPHIC STUDIES:  I have personally reviewed the radiological images as listed and agreed with the findings in the report.  CLINICAL DATA:  Screening.  EXAM: DIGITAL SCREENING BILATERAL MAMMOGRAM WITH CAD  COMPARISON: Previous Exam(s)    BI-RADS CATEGORY 0: Incomplete. Need additional imaging evaluation and/or prior mammograms for comparison.   Electronically Signed  By: Shon Hale M.D.  On: 03/02/2014 16:14   CLINICAL DATA: Right breast calcifications at recent screening mammography.  EXAM: DIGITAL DIAGNOSTIC RIGHT MAMMOGRAM  COMPARISON: Previous examinations, including the screening mammogram dated 03/02/2014.  RECOMMENDATION: Right breast stereotactic guided core needle biopsy. This has been scheduled for 9:45 a.m. on 04/01/2014 at the [facility].  I have discussed the findings and recommendations with the patient. Results were also provided in writing at the conclusion of the visit. If applicable, a reminder letter will be sent to the patient regarding the next appointment.  BI-RADS CATEGORY 4: Suspicious.   Electronically Signed  By: Enrique Sack M.D.  On: 03/17/2014 17:25  ASSESSMENT & PLAN:   DCIS R breast ER+ PR-, focal atypical ductal hyperplasia Vaginal Bleeding  82 year old female with excellent performance status and  DCIS of the right breast. Tumor is estrogen receptor positive and progesterone receptor negative. Total tumor dimension was 0.2 cm. There was  evidence of atypical ductal hyperplasia. She has done very well with surgical resection. She has opted not to pursue radiation therapy. She has been on tamoxifen but has experienced intermittent vaginal spotting.  She has not had a hysterectomy.   She follows with Dr. Elonda Husky. She has been advised in the past to discontinue her tamoxifen but she continues to take it. I re-emphasized to her today I would like for her to stop her tamoxifen. She has a follow-up appointment with gynecology in July. I have ordered a vaginal ultrasound. She is to stay off her tamoxifen. We can discuss other options at follow-up.   Osteopenia on DEXA 2013  She is to continue calcium and vitamin D. She is very active and I have encouraged her to remain as active as possible.   Orders Placed This Encounter  Procedures  . US Transvaginal Non-OB    Standing Status: Future     Number of Occurrences: 1     Standing Expiration Date: 11/23/2016    Order Specific Question:  Reason for Exam (SYMPTOM  OR DIAGNOSIS REQUIRED)    Answer:  vaginal bleeding on tamoxifen    Order Specific Question:  Preferred imaging location?    Answer:  [facility]   All questions were answered. The patient knows to call the clinic with any problems, questions or concerns.  This document serves as a record of services personally performed by Ancil Linsey, MD. It was created on her behalf by Toni Amend, a trained medical scribe. The creation of this record is based on the scribe's personal observations and the provider's statements to them. This document has been checked and approved by the attending provider.  I have reviewed the above documentation for accuracy and completeness and I agree with the above.  This note was electronically signed.    Molli Hazard, MD  09/24/2015 11:44 AM

## 2015-10-01 ENCOUNTER — Other Ambulatory Visit (HOSPITAL_COMMUNITY): Payer: Self-pay | Admitting: Hematology & Oncology

## 2015-10-01 DIAGNOSIS — N950 Postmenopausal bleeding: Secondary | ICD-10-CM

## 2015-10-05 ENCOUNTER — Ambulatory Visit (HOSPITAL_COMMUNITY)
Admission: RE | Admit: 2015-10-05 | Discharge: 2015-10-05 | Disposition: A | Discharge status: Home / Self Care | Payer: PPO | Source: Ambulatory Visit | Attending: Hematology & Oncology | Admitting: Hematology & Oncology

## 2015-10-05 DIAGNOSIS — N950 Postmenopausal bleeding: Secondary | ICD-10-CM | POA: Diagnosis not present

## 2015-10-05 DIAGNOSIS — N858 Other specified noninflammatory disorders of uterus: Secondary | ICD-10-CM | POA: Diagnosis not present

## 2015-10-11 ENCOUNTER — Telehealth (HOSPITAL_COMMUNITY): Payer: Self-pay | Admitting: *Deleted

## 2015-10-11 ENCOUNTER — Encounter (HOSPITAL_COMMUNITY): Payer: Self-pay | Admitting: Hematology & Oncology

## 2015-10-28 DIAGNOSIS — R7309 Other abnormal glucose: Secondary | ICD-10-CM | POA: Diagnosis not present

## 2015-10-28 DIAGNOSIS — Z6829 Body mass index (BMI) 29.0-29.9, adult: Secondary | ICD-10-CM | POA: Diagnosis not present

## 2015-10-28 DIAGNOSIS — G894 Chronic pain syndrome: Secondary | ICD-10-CM | POA: Diagnosis not present

## 2015-10-28 DIAGNOSIS — Z1389 Encounter for screening for other disorder: Secondary | ICD-10-CM | POA: Diagnosis not present

## 2015-10-28 DIAGNOSIS — E663 Overweight: Secondary | ICD-10-CM | POA: Diagnosis not present

## 2015-10-28 DIAGNOSIS — I10 Essential (primary) hypertension: Secondary | ICD-10-CM | POA: Diagnosis not present

## 2015-10-28 DIAGNOSIS — E782 Mixed hyperlipidemia: Secondary | ICD-10-CM | POA: Diagnosis not present

## 2015-11-08 ENCOUNTER — Encounter: Payer: Self-pay | Admitting: Obstetrics & Gynecology

## 2015-11-08 ENCOUNTER — Ambulatory Visit (INDEPENDENT_AMBULATORY_CARE_PROVIDER_SITE_OTHER): Payer: PPO | Admitting: Obstetrics & Gynecology

## 2015-11-08 VITALS — BP 110/62 | HR 72 | Ht 60.0 in | Wt 154.0 lb

## 2015-11-08 DIAGNOSIS — N813 Complete uterovaginal prolapse: Secondary | ICD-10-CM

## 2015-11-08 MED ORDER — METRONIDAZOLE 500 MG PO TABS: 500.0000 mg | ORAL_TABLET | Freq: Two times a day (BID) | ORAL | 0 refills | Status: DC

## 2015-11-08 NOTE — Progress Notes (Signed)
Chief Complaint  Patient presents with  . Follow-up    4 month check pessary    Blood pressure 110/62, pulse 72, height 5' (1.524 m), weight 154 lb (69.9 kg).  Melissa Deleon presents today for routine follow up related to her pessary.   She uses a Milex ring #3 She reports little vaginal discharge or vaginal bleeding.  Exam reveals no undue vaginal mucosal pressure of breakdown, little discharge and little vaginal bleeding.  The pessary is removed, cleaned and replaced without difficulty.  Pt with small amount of episodic spotting most likely related to her pessary  Dr Whitney Muse ordered a sonogram which reveals a thin endometrium 11mm width(pt had undergone interval uterine curettage and hysteroscopy since her ES was 18 mm)  Which does not require any further eval Dr Whitney Muse instructed pt to stop taking her tamoxifen, I will check and make sure this is not related to any endometrial pathology concerns  Will trwat with Flagyl for 7 days an see if that improves her discharge and bleeding  Meds ordered this encounter  Medications  . metroNIDAZOLE (FLAGYL) 500 MG tablet    Sig: Take 1 tablet (500 mg total) by mouth 2 (two) times daily.    Dispense:  14 tablet    Refill:  0     Melissa Deleon will be sen back in 4 months for continued follow up.  Melissa Buff, MD  11/08/2015 9:36 AM

## 2015-12-16 ENCOUNTER — Telehealth: Payer: Self-pay | Admitting: Internal Medicine

## 2015-12-16 NOTE — Telephone Encounter (Signed)
Letter mailed to pt.  

## 2015-12-16 NOTE — Telephone Encounter (Signed)
Pt is due 10 yr colonoscopy °

## 2015-12-24 ENCOUNTER — Ambulatory Visit (HOSPITAL_COMMUNITY): Payer: PPO | Admitting: Oncology

## 2015-12-24 ENCOUNTER — Other Ambulatory Visit (HOSPITAL_COMMUNITY): Payer: PPO

## 2016-01-03 ENCOUNTER — Other Ambulatory Visit (HOSPITAL_COMMUNITY): Payer: Self-pay | Admitting: *Deleted

## 2016-01-03 DIAGNOSIS — N183 Chronic kidney disease, stage 3 unspecified: Secondary | ICD-10-CM

## 2016-01-06 ENCOUNTER — Encounter (HOSPITAL_COMMUNITY): Payer: PPO

## 2016-01-06 ENCOUNTER — Encounter (HOSPITAL_COMMUNITY): Payer: PPO | Attending: Oncology | Admitting: Oncology

## 2016-01-06 ENCOUNTER — Encounter (HOSPITAL_COMMUNITY): Payer: Self-pay | Admitting: Oncology

## 2016-01-06 VITALS — BP 140/77 | HR 70 | Temp 98.3°F | Resp 18 | Wt 155.6 lb

## 2016-01-06 DIAGNOSIS — K219 Gastro-esophageal reflux disease without esophagitis: Secondary | ICD-10-CM | POA: Insufficient documentation

## 2016-01-06 DIAGNOSIS — D0511 Intraductal carcinoma in situ of right breast: Secondary | ICD-10-CM | POA: Insufficient documentation

## 2016-01-06 DIAGNOSIS — Z23 Encounter for immunization: Secondary | ICD-10-CM | POA: Diagnosis not present

## 2016-01-06 DIAGNOSIS — I129 Hypertensive chronic kidney disease with stage 1 through stage 4 chronic kidney disease, or unspecified chronic kidney disease: Secondary | ICD-10-CM | POA: Diagnosis not present

## 2016-01-06 DIAGNOSIS — N183 Chronic kidney disease, stage 3 unspecified: Secondary | ICD-10-CM

## 2016-01-06 DIAGNOSIS — Z0000 Encounter for general adult medical examination without abnormal findings: Secondary | ICD-10-CM

## 2016-01-06 LAB — CBC WITH DIFFERENTIAL/PLATELET
BASOS ABS: 0.1 10*3/uL (ref 0.0–0.1)
Basophils Relative: 1 %
EOS ABS: 0.3 10*3/uL (ref 0.0–0.7)
EOS PCT: 5 %
HCT: 37.5 % (ref 36.0–46.0)
Hemoglobin: 12.1 g/dL (ref 12.0–15.0)
LYMPHS ABS: 2.6 10*3/uL (ref 0.7–4.0)
Lymphocytes Relative: 40 %
MCH: 29.7 pg (ref 26.0–34.0)
MCHC: 32.3 g/dL (ref 30.0–36.0)
MCV: 92.1 fL (ref 78.0–100.0)
Monocytes Absolute: 0.6 10*3/uL (ref 0.1–1.0)
Monocytes Relative: 9 %
Neutro Abs: 2.9 10*3/uL (ref 1.7–7.7)
Neutrophils Relative %: 45 %
PLATELETS: 295 10*3/uL (ref 150–400)
RBC: 4.07 MIL/uL (ref 3.87–5.11)
RDW: 12.4 % (ref 11.5–15.5)
WBC: 6.4 10*3/uL (ref 4.0–10.5)

## 2016-01-06 LAB — COMPREHENSIVE METABOLIC PANEL
ALT: 16 U/L (ref 14–54)
AST: 21 U/L (ref 15–41)
Albumin: 4 g/dL (ref 3.5–5.0)
Alkaline Phosphatase: 72 U/L (ref 38–126)
Anion gap: 6 (ref 5–15)
BUN: 24 mg/dL — AB (ref 6–20)
CHLORIDE: 103 mmol/L (ref 101–111)
CO2: 28 mmol/L (ref 22–32)
CREATININE: 1.09 mg/dL — AB (ref 0.44–1.00)
Calcium: 9.3 mg/dL (ref 8.9–10.3)
GFR calc non Af Amer: 47 mL/min — ABNORMAL LOW (ref >60)
GFR, EST AFRICAN AMERICAN: 54 mL/min — AB (ref >60)
Glucose, Bld: 88 mg/dL (ref 65–99)
Potassium: 4.3 mmol/L (ref 3.5–5.1)
SODIUM: 137 mmol/L (ref 135–145)
Total Bilirubin: 0.3 mg/dL (ref 0.3–1.2)
Total Protein: 7 g/dL (ref 6.5–8.1)

## 2016-01-06 MED ORDER — INFLUENZA VAC SPLIT QUAD 0.5 ML IM SUSY
PREFILLED_SYRINGE | INTRAMUSCULAR | Status: AC
  Filled 2016-01-06: qty 0.5

## 2016-01-06 MED ORDER — INFLUENZA VAC SPLIT QUAD 0.5 ML IM SUSY
0.5000 mL | PREFILLED_SYRINGE | Freq: Once | INTRAMUSCULAR | Status: AC
  Administered 2016-01-06: 0.5 mL via INTRAMUSCULAR

## 2016-01-06 NOTE — Assessment & Plan Note (Addendum)
DCIS of the right breast, ER + and PR negative. Total tumor dimension was 0.2 cm. There was evidence of atypical ductal hyperplasia. She has done very well with lumpectomy on 06/10/2014 by Dr. Arnoldo Morale. She has opted not to pursue radiation therapy but was very interested in trying endocrine therapy. Therefore, she started Tamoxifen on 07/17/2014 but this was complicated in Jan 0000000 with vaginal bleeding resulting in Tamoxifen being placed on hold on 05/24/2015 for Gynecologic work-up.  Gynecologic work-up demonstrated endometrial polyps requiring hysteroscopy uterine curettage with removal of multiple polyps.  Pathology was negative for atypia or malignancy. She restarted tamoxifen on her own on approximately 06/25/2015.  Vaginal bleeding recurrence led to a permanent discontinuation in Tamoxifen in June 2017.  We reviewed other chemoprevention options, including AI therapy.  We discussed the risks, benefits, alternatives, and side effects of AI therapy including, but not limited to hot flashes, arthralgias, myalgias, osteoporosis, and secondary malignancy.  After long discussion, she has opted to follow NCCN guidelines pertaining to surveillance.  She is not interested in further chemoprevention therapy.  This is absolutely reasonable.  She is advised that she will need annual breast exams and annual mammograms.  Self-breast exams are encouraged.  No role for labs today.  Mammogram is ordered and scheduled for December 2017.  Return in 3 months for follow-up.

## 2016-01-06 NOTE — Progress Notes (Signed)
Pt received flu vaccination in left deltoid with no complications. Pt verbalized understanding of vaccination handout

## 2016-01-06 NOTE — Patient Instructions (Addendum)
[place] at [facility] Discharge Instructions  RECOMMENDATIONS MADE BY THE CONSULTANT AND ANY TEST RESULTS WILL BE SENT TO YOUR REFERRING PHYSICIAN.  You were seen by Gershon Mussel today. Flu shot today Mammogram December 2017 Return for follow up in 3 months.   Thank you for choosing [place] at [facility] to provide your oncology and hematology care.  To afford each patient quality time with our provider, please arrive at least 15 minutes before your scheduled appointment time.   Beginning January 23rd 2017 lab work for the [facility] will be done in the  Main lab at [facility] on 1st floor. If you have a lab appointment with the [facility] please come in thru the  Main Entrance and check in at the main information desk  You need to re-schedule your appointment should you arrive 10 or more minutes late.  We strive to give you quality time with our providers, and arriving late affects you and other patients whose appointments are after yours.  Also, if you no show three or more times for appointments you may be dismissed from the clinic at the providers discretion.     Again, thank you for choosing [facility].  Our hope is that these requests will decrease the amount of time that you wait before being seen by our physicians.       _____________________________________________________________  Should you have questions after your visit to [facility], please contact our office at [phone] between the hours of 8:30 a.m. and 4:30 p.m.  Voicemails left after 4:30 p.m. will not be returned until the following business day.  For prescription refill requests, have your pharmacy contact our office.         Resources For Cancer Patients and their Caregivers ? American Cancer Society: Can assist with transportation, wigs, general needs, runs Look Good Feel Better.        212-337-3234 ? Cancer Care: Provides  financial assistance, online support groups, medication/co-pay assistance.  1-800-813-HOPE 681-447-7627) ? [place] Assists [place] cancer patients and their families through emotional , educational and financial support.  970 137 6124 ? [place] DSS Where to apply for food stamps, Medicaid and utility assistance. (437) 499-3845 ? [facility]: Transportation to medical appointments. 337-063-3720 ? Social Security Administration: May apply for disability if have a Stage IV cancer. (762) 236-2903 859-648-7662 ? [employer], Disability and Transit Services: Assists with nutrition, care and transit needs. [place] Support Programs: @10RELATIVEDAYS @ > Cancer Support Group  2nd Tuesday of the month 1pm-2pm, Journey Room  > Creative Journey  3rd Tuesday of the month 1130am-1pm, Journey Room  > Look Good Feel Better  1st Wednesday of the month 10am-12 noon, Journey Room (Call [place] to register [phone])

## 2016-01-08 NOTE — Progress Notes (Signed)
Purvis Kilts, MD [address] [place] [id number]  Preventative health care - Plan: Influenza vac split quadrivalent PF (FLUARIX) injection 0.5 mL  Ductal carcinoma in situ (DCIS) of right breast - Plan: MM DIAG BREAST TOMO BILATERAL  CURRENT THERAPY: Surveillance per NCCN guidelines  INTERVAL HISTORY: Melissa Deleon 82 y.o. female returns for followup of DCIS of the right breast, ER + and PR negative. Total tumor dimension was 0.2 cm. There was evidence of atypical ductal hyperplasia. She has done very well with lumpectomy on 06/10/2014 by Dr. Arnoldo Morale. She has opted not to pursue radiation therapy but was very interested in trying endocrine therapy. Therefore, she started Tamoxifen on 07/17/2014 but this was complicated in Jan 0000000 with vaginal bleeding resulting in Tamoxifen being placed on hold on 05/24/2015 for Gynecologic work-up.  Gynecologic work-up demonstrated endometrial polyps requiring hysteroscopy uterine curettage with removal of multiple polyps.  Pathology was negative for atypia or malignancy. She then restarted Tamoxifen, but again complicated by vaginal bleeding.  As a result, Tamoxifen was discontinued in June 2017.  She denies any complaints today.  She denies any vaginal bleeding.  She is due to see Dr. Elonda Husky in follow-up soon.    She notes that she feels great since stopping Tamoxifen and during conversation regarding future options, she opts to not pursue any further chemoprevention intervention.  Review of Systems  Constitutional: Negative.  Negative for chills, fever and weight loss.  HENT: Negative.   Eyes: Negative.   Respiratory: Negative.  Negative for cough.   Cardiovascular: Negative.  Negative for chest pain.  Gastrointestinal: Negative.   Genitourinary: Negative.   Musculoskeletal: Negative.  Negative for myalgias.  Skin: Negative.   Neurological: Negative.  Negative for weakness.  Endo/Heme/Allergies: Negative.     Psychiatric/Behavioral: Negative.     Past Medical History:  Diagnosis Date  . Breast cancer ([place]) 05/2014  . Chronic renal disease, stage 3, moderately decreased glomerular filtration rate (GFR) between 30-59 mL/min/1.73 square meter 08/13/2014  . Ductal carcinoma in situ (DCIS) of right breast 08/13/2014  . GERD (gastroesophageal reflux disease)   . Hypertension     Past Surgical History:  Procedure Laterality Date  . BACK SURGERY    . Bladder ring    . BREAST BIOPSY Right 06/10/2014   Procedure: RIGHT BREAST BIOPSY AFTER NEEDLE LOCALIZATION;  Surgeon: Jamesetta So, MD;  Location: [facility];  Service: General;  Laterality: Right;  . BREAST LUMPECTOMY Right 06/2014  . HYSTEROSCOPY W/D&C N/A 06/02/2015   Procedure: DILATATION AND CURETTAGE /HYSTEROSCOPY;  Surgeon: Florian Buff, MD;  Location: [facility];  Service: Gynecology;  Laterality: N/A;  . KYPHOPLASTY N/A 06/11/2012   Procedure: KYPHOPLASTY (C-Arm x 2);  Surgeon: Otilio Connors, MD;  Location: [facility] NEURO ORS;  Service: Neurosurgery;  Laterality: N/A;  Lumbar two Kyphoplasty  . LUMBAR LAMINECTOMY/DECOMPRESSION MICRODISCECTOMY  03/07/2012   Procedure: LUMBAR LAMINECTOMY/DECOMPRESSION MICRODISCECTOMY 1 LEVEL;  Surgeon: Otilio Connors, MD;  Location: [facility] NEURO ORS;  Service: Neurosurgery;  Laterality: Left;  Left Lumbar four-five Laminectomy for Synovial cyst  . NO PAST SURGERIES     RING PLACED TO HOLD BLADDER   . POLYPECTOMY N/A 06/02/2015   Procedure: Removal of endometrial Polyps;  Surgeon: Florian Buff, MD;  Location: [facility];  Service: Gynecology;  Laterality: N/A;    Family History  Problem Relation Age of Onset  . Diabetes Mother   . Heart attack Father   . Cancer Sister  breast    Social History   Social History  . Marital status: Widowed    Spouse name: N/A  . Number of children: N/A  . Years of education: N/A   Social History Main Topics  . Smoking status: Never Smoker  . Smokeless tobacco: Never Used  . Alcohol  use No  . Drug use: No  . Sexual activity: Not Currently    Birth control/ protection: Post-menopausal   Other Topics Concern  . None   Social History Narrative  . None     PHYSICAL EXAMINATION  ECOG PERFORMANCE STATUS: 1 - Symptomatic but completely ambulatory  Vitals:   01/06/16 1500  BP: 140/77  Pulse: 70  Resp: 18  Temp: 98.3 F (36.8 C)    GENERAL:alert, no distress, well nourished, well developed, comfortable, cooperative, smiling and unaccompanied SKIN: skin color, texture, turgor are normal, no rashes or significant lesions HEAD: Normocephalic, No masses, lesions, tenderness or abnormalities EYES: normal, EOMI, Conjunctiva are pink and non-injected EARS: External ears normal OROPHARYNX:lips, buccal mucosa, and tongue normal  NECK: supple, trachea midline LYMPH:  no palpable lymphadenopathy BREAST:not examined LUNGS: clear to auscultation and percussion HEART: regular rate & rhythm ABDOMEN:abdomen soft and normal bowel sounds BACK: Back symmetric, no curvature. EXTREMITIES:less then 2 second capillary refill, no joint deformities, effusion, or inflammation, no skin discoloration, no cyanosis  NEURO: alert & oriented x 3 with fluent speech, no focal motor/sensory deficits, gait normal   LABORATORY DATA: CBC    Component Value Date/Time   WBC 6.4 01/06/2016 1359   RBC 4.07 01/06/2016 1359   HGB 12.1 01/06/2016 1359   HCT 37.5 01/06/2016 1359   PLT 295 01/06/2016 1359   MCV 92.1 01/06/2016 1359   MCH 29.7 01/06/2016 1359   MCHC 32.3 01/06/2016 1359   RDW 12.4 01/06/2016 1359   LYMPHSABS 2.6 01/06/2016 1359   MONOABS 0.6 01/06/2016 1359   EOSABS 0.3 01/06/2016 1359   BASOSABS 0.1 01/06/2016 1359      Chemistry      Component Value Date/Time   NA 137 01/06/2016 1359   K 4.3 01/06/2016 1359   CL 103 01/06/2016 1359   CO2 28 01/06/2016 1359   BUN 24 (H) 01/06/2016 1359   CREATININE 1.09 (H) 01/06/2016 1359      Component Value Date/Time    CALCIUM 9.3 01/06/2016 1359   ALKPHOS 72 01/06/2016 1359   AST 21 01/06/2016 1359   ALT 16 01/06/2016 1359   BILITOT 0.3 01/06/2016 1359        PENDING LABS:   RADIOGRAPHIC STUDIES:  No results found.   PATHOLOGY:    ASSESSMENT AND PLAN:  Ductal carcinoma in situ (DCIS) of right breast DCIS of the right breast, ER + and PR negative. Total tumor dimension was 0.2 cm. There was evidence of atypical ductal hyperplasia. She has done very well with lumpectomy on 06/10/2014 by Dr. Arnoldo Morale. She has opted not to pursue radiation therapy but was very interested in trying endocrine therapy. Therefore, she started Tamoxifen on 07/17/2014 but this was complicated in Jan 0000000 with vaginal bleeding resulting in Tamoxifen being placed on hold on 05/24/2015 for Gynecologic work-up.  Gynecologic work-up demonstrated endometrial polyps requiring hysteroscopy uterine curettage with removal of multiple polyps.  Pathology was negative for atypia or malignancy. She restarted tamoxifen on her own on approximately 06/25/2015.  Vaginal bleeding recurrence led to a permanent discontinuation in Tamoxifen in June 2017.  We reviewed other chemoprevention options, including AI therapy.  We discussed  the risks, benefits, alternatives, and side effects of AI therapy including, but not limited to hot flashes, arthralgias, myalgias, osteoporosis, and secondary malignancy.  After long discussion, she has opted to follow NCCN guidelines pertaining to surveillance.  She is not interested in further chemoprevention therapy.  This is absolutely reasonable.  She is advised that she will need annual breast exams and annual mammograms.  Self-breast exams are encouraged.  No role for labs today.  Mammogram is ordered and scheduled for December 2017.  Return in 3 months for follow-up.   ORDERS PLACED FOR THIS ENCOUNTER: Orders Placed This Encounter  Procedures  . MM DIAG BREAST TOMO BILATERAL    MEDICATIONS PRESCRIBED THIS  ENCOUNTER: Meds ordered this encounter  Medications  . Influenza vac split quadrivalent PF (FLUARIX) injection 0.5 mL    THERAPY PLAN:  NCCN guidelines recommends the following surveillance for DCIS of breast post-operatively (2.2017):  1. Interval history and physical exam every 6-12 months for 5 years, then annually.  2. Mammogram every 12 months (and 6-12 months postradiation therapy if breast conserved Category 2B).  3. If treated with Tamoxifen, monitor per NCCN guidelines for Breast Cancer Risk Reduction.  Risk reduction therapy for ipsilateral breast folling breast-conserving surgery (2.2017):  1. Consider endocrine therapy for 5 years for:   A. Patients treated with breast conserving therapy (lumpectomy) and radiation therapy (category 1), especially for those with ER positive DCIS.   B. Benefit of endocrine therapy for ER negative DCIS is uncertain.   C. Patients treated with excision alone.  2. Endocrine therapy:   A. Tamoxifen for premenopausal patients.   B. Tamoxifen or aromatase inhibitor for postmenopausal patients with some advantage for aromatase inhibitor therapy in patients less than 68 years old or with concerns for thromboembolism  3. Risk reduction therapy for contralateral breast:   A. Counseling regarding risk reduction. See NCCN guidelines for breast cancer risk reduction.   All questions were answered. The patient knows to call the clinic with any problems, questions or concerns. We can certainly see the patient much sooner if necessary.  Patient and plan discussed with Dr. Ancil Linsey and she is in agreement with the aforementioned.   This note is electronically signed by: Doy Mince 01/08/2016 11:32 PM

## 2016-03-01 DIAGNOSIS — I10 Essential (primary) hypertension: Secondary | ICD-10-CM | POA: Diagnosis not present

## 2016-03-01 DIAGNOSIS — G894 Chronic pain syndrome: Secondary | ICD-10-CM | POA: Diagnosis not present

## 2016-03-01 DIAGNOSIS — Z6829 Body mass index (BMI) 29.0-29.9, adult: Secondary | ICD-10-CM | POA: Diagnosis not present

## 2016-03-01 DIAGNOSIS — Z1389 Encounter for screening for other disorder: Secondary | ICD-10-CM | POA: Diagnosis not present

## 2016-03-01 DIAGNOSIS — R7309 Other abnormal glucose: Secondary | ICD-10-CM | POA: Diagnosis not present

## 2016-03-13 ENCOUNTER — Ambulatory Visit: Payer: PPO | Admitting: Obstetrics & Gynecology

## 2016-03-17 ENCOUNTER — Encounter: Payer: Self-pay | Admitting: Obstetrics & Gynecology

## 2016-03-17 ENCOUNTER — Ambulatory Visit (INDEPENDENT_AMBULATORY_CARE_PROVIDER_SITE_OTHER): Payer: PPO | Admitting: Obstetrics & Gynecology

## 2016-03-17 VITALS — BP 120/70 | HR 80 | Wt 158.4 lb

## 2016-03-17 DIAGNOSIS — N813 Complete uterovaginal prolapse: Secondary | ICD-10-CM | POA: Diagnosis not present

## 2016-03-17 DIAGNOSIS — Z4689 Encounter for fitting and adjustment of other specified devices: Secondary | ICD-10-CM

## 2016-03-17 NOTE — Progress Notes (Signed)
Chief Complaint  Patient presents with  . Pessary Check    clean    Blood pressure 120/70, pulse 80, weight 158 lb 6.4 oz (71.8 kg).  Melissa Deleon presents today for routine follow up related to her pessary.   She uses a Milex ring with support #3 She reports no vaginal discharge or vaginal bleeding.  Exam reveals no undue vaginal mucosal pressure of breakdown, no discharge and no vaginal bleeding.  The pessary is removed, cleaned and replaced without difficulty.    Melissa Deleon will be sen back in 4 months for continued follow up.  Florian Buff, MD  03/17/2016 11:25 AM

## 2016-03-27 ENCOUNTER — Other Ambulatory Visit (HOSPITAL_COMMUNITY): Payer: Self-pay | Admitting: Hematology & Oncology

## 2016-03-27 ENCOUNTER — Other Ambulatory Visit (HOSPITAL_COMMUNITY): Payer: Self-pay | Admitting: Family Medicine

## 2016-03-27 DIAGNOSIS — Z853 Personal history of malignant neoplasm of breast: Secondary | ICD-10-CM

## 2016-03-28 ENCOUNTER — Other Ambulatory Visit (HOSPITAL_COMMUNITY): Payer: Self-pay | Admitting: Oncology

## 2016-03-28 ENCOUNTER — Ambulatory Visit (HOSPITAL_COMMUNITY)
Admission: RE | Admit: 2016-03-28 | Discharge: 2016-03-28 | Disposition: A | Discharge status: Home / Self Care | Payer: PPO | Source: Ambulatory Visit | Attending: Oncology | Admitting: Oncology

## 2016-03-28 ENCOUNTER — Other Ambulatory Visit (HOSPITAL_COMMUNITY): Payer: Self-pay | Admitting: Hematology & Oncology

## 2016-03-28 DIAGNOSIS — Z9889 Other specified postprocedural states: Secondary | ICD-10-CM | POA: Diagnosis not present

## 2016-03-28 DIAGNOSIS — Z853 Personal history of malignant neoplasm of breast: Secondary | ICD-10-CM

## 2016-03-28 DIAGNOSIS — R928 Other abnormal and inconclusive findings on diagnostic imaging of breast: Secondary | ICD-10-CM | POA: Diagnosis not present

## 2016-03-28 DIAGNOSIS — D0511 Intraductal carcinoma in situ of right breast: Secondary | ICD-10-CM

## 2016-04-06 ENCOUNTER — Encounter (HOSPITAL_COMMUNITY): Payer: Self-pay | Admitting: Oncology

## 2016-04-06 ENCOUNTER — Encounter (HOSPITAL_COMMUNITY): Payer: PPO | Attending: Oncology | Admitting: Oncology

## 2016-04-06 DIAGNOSIS — D0511 Intraductal carcinoma in situ of right breast: Secondary | ICD-10-CM

## 2016-04-06 DIAGNOSIS — Z853 Personal history of malignant neoplasm of breast: Secondary | ICD-10-CM | POA: Diagnosis not present

## 2016-04-06 NOTE — Progress Notes (Signed)
Melissa Deleon, Melissa Deleon  Ductal carcinoma in situ (DCIS) of right breast  CURRENT THERAPY: Surveillance per NCCN guidelines  INTERVAL HISTORY: Melissa Deleon 82 y.o. female returns for followup of DCIS of the right breast, ER + and PR negative. Total tumor dimension was 0.2 cm. There was evidence of atypical ductal hyperplasia. She has done very well with lumpectomy on 06/10/2014 by Dr. Arnoldo Morale. She has opted not to pursue radiation therapy but was very interested in trying endocrine therapy. Therefore, she started Tamoxifen on 07/17/2014 but this was complicated in Jan 0000000 with vaginal bleeding resulting in Tamoxifen being placed on hold on 05/24/2015 for Gynecologic work-up.  Gynecologic work-up demonstrated endometrial polyps requiring hysteroscopy uterine curettage with removal of multiple polyps.  Pathology was negative for atypia or malignancy. She then restarted Tamoxifen, but again complicated by vaginal bleeding.  As a result, Tamoxifen was discontinued in June 2017.  She denies any oncology complaints today.     She does perform self-breast exams.   Review of Systems  Constitutional: Negative.  Negative for chills, fever and weight loss.  HENT: Negative.   Eyes: Negative.   Respiratory: Negative.  Negative for cough.   Cardiovascular: Negative.  Negative for chest pain.  Gastrointestinal: Negative.   Genitourinary: Negative.   Musculoskeletal: Negative.  Negative for myalgias.  Skin: Negative.   Neurological: Negative.  Negative for weakness.  Endo/Heme/Allergies: Negative.   Psychiatric/Behavioral: Negative.     Past Medical History:  Diagnosis Date  . Breast cancer ([place]) 05/2014  . Chronic renal disease, stage 3, moderately decreased glomerular filtration rate (GFR) between 30-59 mL/min/1.73 square meter 08/13/2014  . Ductal carcinoma in situ (DCIS) of right breast 08/13/2014  . GERD (gastroesophageal reflux disease)   .  Hypertension     Past Surgical History:  Procedure Laterality Date  . BACK SURGERY    . Bladder ring    . BREAST BIOPSY Right 06/10/2014   Procedure: RIGHT BREAST BIOPSY AFTER NEEDLE LOCALIZATION;  Surgeon: Jamesetta So, MD;  Location: [facility];  Service: General;  Laterality: Right;  . BREAST LUMPECTOMY Right 06/2014  . HYSTEROSCOPY W/D&C N/A 06/02/2015   Procedure: DILATATION AND CURETTAGE /HYSTEROSCOPY;  Surgeon: Florian Buff, MD;  Location: [facility];  Service: Gynecology;  Laterality: N/A;  . KYPHOPLASTY N/A 06/11/2012   Procedure: KYPHOPLASTY (C-Arm x 2);  Surgeon: Otilio Connors, MD;  Location: [facility]O ORS;  Service: Neurosurgery;  Laterality: N/A;  Lumbar two Kyphoplasty  . LUMBAR LAMINECTOMY/DECOMPRESSION MICRODISCECTOMY  03/07/2012   Procedure: LUMBAR LAMINECTOMY/DECOMPRESSION MICRODISCECTOMY 1 LEVEL;  Surgeon: Otilio Connors, MD;  Location: [facility] NEURO ORS;  Service: Neurosurgery;  Laterality: Left;  Left Lumbar four-five Laminectomy for Synovial cyst  . NO PAST SURGERIES     RING PLACED TO HOLD BLADDER   . POLYPECTOMY N/A 06/02/2015   Procedure: Removal of endometrial Polyps;  Surgeon: Florian Buff, MD;  Location: [facility];  Service: Gynecology;  Laterality: N/A;    Family History  Problem Relation Age of Onset  . Diabetes Mother   . Heart attack Father   . Cancer Sister     breast    Social History   Social History  . Marital status: Widowed    Spouse name: N/A  . Number of children: N/A  . Years of education: N/A   Social History Main Topics  . Smoking status: Never Smoker  . Smokeless tobacco: Never Used  .  Alcohol use No  . Drug use: No  . Sexual activity: Not Currently    Birth control/ protection: Post-menopausal   Other Topics Concern  . None   Social History Narrative  . None     PHYSICAL EXAMINATION  ECOG PERFORMANCE STATUS: 1 - Symptomatic but completely ambulatory  Vitals:   04/06/16 1410  BP: 136/61  Pulse: 95  Resp: 16    GENERAL:alert,  no distress, well nourished, well developed, comfortable, cooperative, smiling and unaccompanied SKIN: skin color, texture, turgor are normal, no rashes or significant lesions HEAD: Normocephalic, No masses, lesions, tenderness or abnormalities EYES: normal, EOMI, Conjunctiva are pink and non-injected EARS: External ears normal OROPHARYNX:lips, buccal mucosa, and tongue normal  NECK: supple, trachea midline LYMPH:  no palpable lymphadenopathy BREAST:not examined LUNGS: clear to auscultation and percussion HEART: regular rate & rhythm ABDOMEN:abdomen soft and normal bowel sounds BACK: Back symmetric, no curvature. EXTREMITIES:less then 2 second capillary refill, no joint deformities, effusion, or inflammation, no skin discoloration, no cyanosis  NEURO: alert & oriented x 3 with fluent speech, no focal motor/sensory deficits, gait normal   LABORATORY DATA: CBC    Component Value Date/Time   WBC 6.4 01/06/2016 1359   RBC 4.07 01/06/2016 1359   HGB 12.1 01/06/2016 1359   HCT 37.5 01/06/2016 1359   PLT 295 01/06/2016 1359   MCV 92.1 01/06/2016 1359   MCH 29.7 01/06/2016 1359   MCHC 32.3 01/06/2016 1359   RDW 12.4 01/06/2016 1359   LYMPHSABS 2.6 01/06/2016 1359   MONOABS 0.6 01/06/2016 1359   EOSABS 0.3 01/06/2016 1359   BASOSABS 0.1 01/06/2016 1359      Chemistry      Component Value Date/Time   NA 137 01/06/2016 1359   K 4.3 01/06/2016 1359   CL 103 01/06/2016 1359   CO2 28 01/06/2016 1359   BUN 24 (H) 01/06/2016 1359   CREATININE 1.09 (H) 01/06/2016 1359      Component Value Date/Time   CALCIUM 9.3 01/06/2016 1359   ALKPHOS 72 01/06/2016 1359   AST 21 01/06/2016 1359   ALT 16 01/06/2016 1359   BILITOT 0.3 01/06/2016 1359        PENDING LABS:   RADIOGRAPHIC STUDIES:  Mm Diag Breast Tomo Bilateral  Result Date: 03/28/2016 CLINICAL DATA:  History of right breast cancer, status post lumpectomy in February 2016. Annual exam. EXAM: 2D DIGITAL DIAGNOSTIC  BILATERAL MAMMOGRAM WITH CAD AND ADJUNCT TOMO COMPARISON:  Previous exam(s). ACR Breast Density Category b: There are scattered areas of fibroglandular density. FINDINGS: Lumpectomy changes are noted in the outer right breast, less prominent on today's mammogram compared to the December 2016 mammogram. No mass, nonsurgical distortion, or suspicious microcalcification is identified in either breast to suggest malignancy. Mammographic images were processed with CAD. IMPRESSION: No evidence of malignancy in either breast. Lumpectomy changes in the right breast. RECOMMENDATION: Diagnostic mammogram is suggested in 1 year. (Code:[id number]) I have discussed the findings and recommendations with the patient. Results were also provided in writing at the conclusion of the visit. If applicable, a reminder letter will be sent to the patient regarding the next appointment. BI-RADS CATEGORY  2: Benign. Electronically Signed   By: Curlene Dolphin M.D.   On: 03/28/2016 09:52     PATHOLOGY:    ASSESSMENT AND PLAN:  Ductal carcinoma in situ (DCIS) of right breast DCIS of the right breast, ER + and PR negative. Total tumor dimension was 0.2 cm. There was evidence of atypical ductal hyperplasia. She  has done very well with lumpectomy on 06/10/2014 by Dr. Arnoldo Morale. She has opted not to pursue radiation therapy but was very interested in trying endocrine therapy. Therefore, she started Tamoxifen on 07/17/2014 but this was complicated in Jan 0000000 with vaginal bleeding resulting in Tamoxifen being placed on hold on 05/24/2015 for Gynecologic work-up.  Gynecologic work-up demonstrated endometrial polyps requiring hysteroscopy uterine curettage with removal of multiple polyps.  Pathology was negative for atypia or malignancy. She restarted tamoxifen on her own on approximately 06/25/2015.  Vaginal bleeding recurrence led to a permanent discontinuation in Tamoxifen in June 2017.  We reviewed other chemoprevention options, including AI  therapy.  We discussed the risks, benefits, alternatives, and side effects of AI therapy including, but not limited to hot flashes, arthralgias, myalgias, osteoporosis, and secondary malignancy.  After long discussion, she has opted to follow NCCN guidelines pertaining to surveillance.  She is not interested in further chemoprevention therapy.  This is absolutely reasonable.  She is advised that she will need annual breast exams and annual mammograms.  Self-breast exams are encouraged.  No role for labs today.  I personally reviewed and went over radiographic studies with the patient.  The results are noted within this dictation.  Mammogram in December 2017 was negative.  She will be due December 2018.  Return in 6 months for follow-up with breast exam.   ORDERS PLACED FOR THIS ENCOUNTER: No orders of the defined types were placed in this encounter.   MEDICATIONS PRESCRIBED THIS ENCOUNTER: Meds ordered this encounter  Medications  . HYDROcodone-acetaminophen (NORCO/VICODIN) 5-325 MG tablet    THERAPY PLAN:  NCCN guidelines recommends the following surveillance for DCIS of breast post-operatively (2.2017):  1. Interval history and physical exam every 6-12 months for 5 years, then annually.  2. Mammogram every 12 months (and 6-12 months postradiation therapy if breast conserved Category 2B).  3. If treated with Tamoxifen, monitor per NCCN guidelines for Breast Cancer Risk Reduction.  Risk reduction therapy for ipsilateral breast folling breast-conserving surgery (2.2017):  1. Consider endocrine therapy for 5 years for:   A. Patients treated with breast conserving therapy (lumpectomy) and radiation therapy (category 1), especially for those with ER positive DCIS.   B. Benefit of endocrine therapy for ER negative DCIS is uncertain.   C. Patients treated with excision alone.  2. Endocrine therapy:   A. Tamoxifen for premenopausal patients.   B. Tamoxifen or aromatase inhibitor for  postmenopausal patients with some advantage for aromatase inhibitor therapy in patients less than 94 years old or with concerns for thromboembolism  3. Risk reduction therapy for contralateral breast:   A. Counseling regarding risk reduction. See NCCN guidelines for breast cancer risk reduction.   All questions were answered. The patient knows to call the clinic with any problems, questions or concerns. We can certainly see the patient much sooner if necessary.  Patient and plan discussed with Dr. Ancil Linsey and she is in agreement with the aforementioned.   This note is electronically signed by: Robynn Pane, PA-C 04/06/2016 2:30 PM

## 2016-04-06 NOTE — Patient Instructions (Addendum)
[place] at [facility] Discharge Instructions  RECOMMENDATIONS MADE BY THE CONSULTANT AND ANY TEST RESULTS WILL BE SENT TO YOUR REFERRING PHYSICIAN.  You were seen today by [name] PA-C. Return in 6 months for follow up    Thank you for choosing [place] at [facility] to provide your oncology and hematology care.  To afford each patient quality time with our provider, please arrive at least 15 minutes before your scheduled appointment time.   Beginning January 23rd 2017 lab work for the [facility] will be done in the  Main lab at [facility] on 1st floor. If you have a lab appointment with the [facility] please come in thru the  Main Entrance and check in at the main information desk  You need to re-schedule your appointment should you arrive 10 or more minutes late.  We strive to give you quality time with our providers, and arriving late affects you and other patients whose appointments are after yours.  Also, if you no show three or more times for appointments you may be dismissed from the clinic at the providers discretion.     Again, thank you for choosing [facility].  Our hope is that these requests will decrease the amount of time that you wait before being seen by our physicians.       _____________________________________________________________  Should you have questions after your visit to [facility], please contact our office at [phone] between the hours of 8:30 a.m. and 4:30 p.m.  Voicemails left after 4:30 p.m. will not be returned until the following business day.  For prescription refill requests, have your pharmacy contact our office.         Resources For Cancer Patients and their Caregivers ? American Cancer Society: Can assist with transportation, wigs, general needs, runs Look Good Feel Better.        (816)446-3549 ? Cancer Care: Provides financial assistance, online  support groups, medication/co-pay assistance.  1-800-813-HOPE 641-049-4939) ? [facility] Assists [place] cancer patients and their families through emotional , educational and financial support.  8736161845 ? [place] DSS Where to apply for food stamps, Medicaid and utility assistance. (602) 089-0220 ? [facility]: Transportation to medical appointments. 403 680 7994 ? Social Security Administration: May apply for disability if have a Stage IV cancer. (559) 343-3296 (705)106-4114 ? [employer], Disability and Transit Services: Assists with nutrition, care and transit needs. [facility] [place] Support Programs: @10RELATIVEDAYS @ > Cancer Support Group  2nd Tuesday of the month 1pm-2pm, Journey Room  > Creative Journey  3rd Tuesday of the month 1130am-1pm, Journey Room  > Look Good Feel Better  1st Wednesday of the month 10am-12 noon, Journey Room (Call [facility] to register [phone])

## 2016-04-06 NOTE — Assessment & Plan Note (Addendum)
DCIS of the right breast, ER + and PR negative. Total tumor dimension was 0.2 cm. There was evidence of atypical ductal hyperplasia. She has done very well with lumpectomy on 06/10/2014 by Dr. Arnoldo Morale. She has opted not to pursue radiation therapy but was very interested in trying endocrine therapy. Therefore, she started Tamoxifen on 07/17/2014 but this was complicated in Jan 0000000 with vaginal bleeding resulting in Tamoxifen being placed on hold on 05/24/2015 for Gynecologic work-up.  Gynecologic work-up demonstrated endometrial polyps requiring hysteroscopy uterine curettage with removal of multiple polyps.  Pathology was negative for atypia or malignancy. She restarted tamoxifen on her own on approximately 06/25/2015.  Vaginal bleeding recurrence led to a permanent discontinuation in Tamoxifen in June 2017.  We reviewed other chemoprevention options, including AI therapy.  We discussed the risks, benefits, alternatives, and side effects of AI therapy including, but not limited to hot flashes, arthralgias, myalgias, osteoporosis, and secondary malignancy.  After long discussion, she has opted to follow NCCN guidelines pertaining to surveillance.  She is not interested in further chemoprevention therapy.  This is absolutely reasonable.  She is advised that she will need annual breast exams and annual mammograms.  Self-breast exams are encouraged.  No role for labs today.  I personally reviewed and went over radiographic studies with the patient.  The results are noted within this dictation.  Mammogram in December 2017 was negative.  She will be due December 2018.  Return in 6 months for follow-up with breast exam.

## 2016-06-10 ENCOUNTER — Other Ambulatory Visit: Payer: Self-pay | Admitting: Obstetrics & Gynecology

## 2016-07-12 ENCOUNTER — Other Ambulatory Visit: Payer: Self-pay | Admitting: Obstetrics & Gynecology

## 2016-07-17 ENCOUNTER — Encounter (INDEPENDENT_AMBULATORY_CARE_PROVIDER_SITE_OTHER): Payer: Self-pay

## 2016-07-17 ENCOUNTER — Ambulatory Visit (INDEPENDENT_AMBULATORY_CARE_PROVIDER_SITE_OTHER): Payer: PPO | Admitting: Obstetrics & Gynecology

## 2016-07-17 ENCOUNTER — Encounter: Payer: Self-pay | Admitting: Obstetrics & Gynecology

## 2016-07-17 VITALS — BP 110/70 | HR 80 | Wt 159.2 lb

## 2016-07-17 DIAGNOSIS — Z4689 Encounter for fitting and adjustment of other specified devices: Secondary | ICD-10-CM

## 2016-07-17 DIAGNOSIS — N813 Complete uterovaginal prolapse: Secondary | ICD-10-CM

## 2016-07-17 NOTE — Progress Notes (Signed)
Chief Complaint  Patient presents with  . Pessary Check    clean/ room # 11    Blood pressure 110/70, pulse 80, weight 159 lb 3.2 oz (72.2 kg).  Melissa Deleon presents today for routine follow up related to her pessary.   She uses a Milex ring with support #3 She reports no vaginal discharge or vaginal bleeding.  Exam reveals no undue vaginal mucosal pressure of breakdown, no discharge and no vaginal bleeding.  The pessary is removed, cleaned and replaced without difficulty.    Melissa Deleon will be sen back in 4 months for continued follow up.  Florian Buff, MD  07/17/2016 10:39 AM

## 2016-10-04 NOTE — Progress Notes (Signed)
[place], [place] [address]   CLINIC:  Medical Oncology/Hematology  PCP:  Sharilyn Sites, [address] [place] [address] [phone]   REASON FOR VISIT:  Follow-up for Stage 0 right breast DCIS, ER+/PR-  CURRENT THERAPY: Surveillance    HISTORY OF PRESENT ILLNESS:  (From [name], PA-C's last note on 04/06/16)     INTERVAL HISTORY:  [name] 83 y.o. female returns for routine follow-up for history of right breast DCIS.   At previous visits, she opted not to take any additional anti-estrogen therapy. She reports that she does perform self-breast exams and has no breast complaints/changes to report today.   Overall, she reports feeling quite well. Her appetite is 75%, she endorses occasional fatigue, with energy levels 50%. Endorses bilateral lower extremity swelling, "but it's gotten worse since it's been hot outside." Reports that her right leg and knee hurts at 10/10 on pain scale; pain worse with ambulation, and improves with rest. Endorses pain behind her knee as well as to the joint itself.  She continues to have follow-up with Dr. Elonda Husky from gynecology. She has chronic intermittent vaginal bleeding, which she is aware of and manages. She continues to have urinary incontinence; her pessary is changed as directed by her gynecologist. Ladon Applebaum easy bruising. She had a fall about one year ago, where she tripped over a rug entering her home; no falls since that time.  Last mammogram was in 03/2016 and negative. She sees Dr. Hilma Favors for her primary care needs; reports that she is due for a visit with him soon, "because it's been a long time since I seen him."  Otherwise she is largely without complaints today. In her free time, she really enjoys spending time with her grandchildren. She cooks for them and sees them at least once per week; they range in ages from 71-16. She also has a great granddaughter who is 33 years  old.    REVIEW OF SYSTEMS:  Review of Systems  Constitutional: Positive for fatigue. Negative for chills and fever.  HENT:  Negative.  Negative for lump/mass and nosebleeds.   Eyes: Negative.   Respiratory: Negative.  Negative for cough and shortness of breath.   Cardiovascular: Positive for leg swelling. Negative for chest pain.  Gastrointestinal: Negative.  Negative for abdominal pain, blood in stool, constipation, diarrhea, nausea and vomiting.  Endocrine: Negative.   Genitourinary: Positive for bladder incontinence and vaginal bleeding. Negative for dysuria and hematuria.   Musculoskeletal: Positive for arthralgias.  Skin: Negative.  Negative for rash.  Neurological: Negative.  Negative for dizziness and headaches.  Hematological: Negative for adenopathy. Bruises/bleeds easily.  Psychiatric/Behavioral: Negative.  Negative for depression and sleep disturbance. The patient is not nervous/anxious.      PAST MEDICAL/SURGICAL HISTORY:  Past Medical History:  Diagnosis Date  . Breast cancer ([facility]) 05/2014  . Chronic renal disease, stage 3, moderately decreased glomerular filtration rate (GFR) between 30-59 mL/min/1.73 square meter 08/13/2014  . Ductal carcinoma in situ (DCIS) of right breast 08/13/2014  . GERD (gastroesophageal reflux disease)   . Hypertension    Past Surgical History:  Procedure Laterality Date  . BACK SURGERY    . Bladder ring    . BREAST BIOPSY Right 06/10/2014   Procedure: RIGHT BREAST BIOPSY AFTER NEEDLE LOCALIZATION;  Surgeon: Jamesetta So, MD;  Location: [facility];  Service: General;  Laterality: Right;  . BREAST LUMPECTOMY Right 06/2014  . HYSTEROSCOPY W/D&C N/A 06/02/2015   Procedure:  DILATATION AND CURETTAGE /HYSTEROSCOPY;  Surgeon: Florian Buff, MD;  Location: [facility];  Service: Gynecology;  Laterality: N/A;  . KYPHOPLASTY N/A 06/11/2012   Procedure: KYPHOPLASTY (C-Arm x 2);  Surgeon: Otilio Connors, MD;  Location: [facility];  Service: Neurosurgery;   Laterality: N/A;  Lumbar two Kyphoplasty  . LUMBAR LAMINECTOMY/DECOMPRESSION MICRODISCECTOMY  03/07/2012   Procedure: LUMBAR LAMINECTOMY/DECOMPRESSION MICRODISCECTOMY 1 LEVEL;  Surgeon: Otilio Connors, MD;  Location: [facility] NEURO ORS;  Service: Neurosurgery;  Laterality: Left;  Left Lumbar four-five Laminectomy for Synovial cyst  . NO PAST SURGERIES     RING PLACED TO HOLD BLADDER   . POLYPECTOMY N/A 06/02/2015   Procedure: Removal of endometrial Polyps;  Surgeon: Florian Buff, MD;  Location: [facility];  Service: Gynecology;  Laterality: N/A;     SOCIAL HISTORY:  Social History   Social History  . Marital status: Widowed    Spouse name: N/A  . Number of children: N/A  . Years of education: N/A   Occupational History  . Not on file.   Social History Main Topics  . Smoking status: Never Smoker  . Smokeless tobacco: Never Used  . Alcohol use No  . Drug use: No  . Sexual activity: Not Currently    Birth control/ protection: Post-menopausal   Other Topics Concern  . Not on file   Social History Narrative  . No narrative on file    FAMILY HISTORY:  Family History  Problem Relation Age of Onset  . Diabetes Mother   . Heart attack Father   . Cancer Sister        breast    CURRENT MEDICATIONS:  Outpatient Encounter Prescriptions as of 10/05/2016  Medication Sig  . HYDROcodone-acetaminophen (NORCO/VICODIN) 5-325 MG tablet Take 1 tablet by mouth every 6 (six) hours as needed for moderate pain.  Marland Kitchen lisinopril-hydrochlorothiazide (PRINZIDE,ZESTORETIC) 20-25 MG per tablet Take 1 tablet by mouth daily.  . pravastatin (PRAVACHOL) 10 MG tablet Take 1 tablet by mouth daily.  Marland Kitchen tolterodine (DETROL LA) 4 MG 24 hr capsule TAKE ONE (1) CAPSULE EACH DAY   No facility-administered encounter medications on file as of 10/05/2016.     ALLERGIES:  Allergies  Allergen Reactions  . Sulfa Antibiotics     Nausea      PHYSICAL EXAM:  ECOG Performance status: 1 - Symptomatic; remains  independent   Vitals:   10/05/16 0900  BP: (!) 144/78  Pulse: 94  Resp: 16  Temp: 99.6 F (37.6 C)   Filed Weights   10/05/16 0900  Weight: 163 lb 3.2 oz (74 kg)    Physical Exam  Constitutional: She is oriented to person, place, and time and well-developed, well-nourished, and in no distress.  HENT:  Head: Normocephalic.  Mouth/Throat: Oropharynx is clear and moist. No oropharyngeal exudate.  Eyes: Conjunctivae are normal. Pupils are equal, round, and reactive to light. No scleral icterus.  Neck: Normal range of motion. Neck supple.  Cardiovascular: Normal rate, regular rhythm and normal heart sounds.   Pulmonary/Chest: Effort normal. No respiratory distress. She has wheezes (Mild expiratory wheezes).    Abdominal: Soft. Bowel sounds are normal. There is no tenderness. There is no rebound and no guarding.  Musculoskeletal: Normal range of motion. She exhibits edema (1+ BLE/pedal edema. ).  Negative Homan's sign to right leg.  Prominent varicose veins noted to left lower leg.   Lymphadenopathy:    She has no cervical adenopathy.       Right: No supraclavicular  adenopathy present.       Left: No supraclavicular adenopathy present.  Neurological: She is alert and oriented to person, place, and time. No cranial nerve deficit. Gait normal.  Skin: Skin is warm and dry. No rash noted.  Psychiatric: Mood, memory, affect and judgment normal.  Nursing note and vitals reviewed.    LABORATORY DATA:  I have reviewed the labs as listed.  CBC    Component Value Date/Time   WBC 6.4 01/06/2016 1359   RBC 4.07 01/06/2016 1359   HGB 12.1 01/06/2016 1359   HCT 37.5 01/06/2016 1359   PLT 295 01/06/2016 1359   MCV 92.1 01/06/2016 1359   MCH 29.7 01/06/2016 1359   MCHC 32.3 01/06/2016 1359   RDW 12.4 01/06/2016 1359   LYMPHSABS 2.6 01/06/2016 1359   MONOABS 0.6 01/06/2016 1359   EOSABS 0.3 01/06/2016 1359   BASOSABS 0.1 01/06/2016 1359   CMP Latest Ref Rng & Units 01/06/2016  09/24/2015 05/28/2015  Glucose 65 - 99 mg/dL 88 99 104(H)  BUN 6 - 20 mg/dL 24(H) 21(H) 13  Creatinine 0.44 - 1.00 mg/dL 1.09(H) 1.19(H) 0.85  Sodium 135 - 145 mmol/L 137 136 139  Potassium 3.5 - 5.1 mmol/L 4.3 4.0 4.2  Chloride 101 - 111 mmol/L 103 103 102  CO2 22 - 32 mmol/L 28 26 30   Calcium 8.9 - 10.3 mg/dL 9.3 9.3 9.4  Total Protein 6.5 - 8.1 g/dL 7.0 7.1 6.4(L)  Total Bilirubin 0.3 - 1.2 mg/dL 0.3 0.5 0.4  Alkaline Phos 38 - 126 U/L 72 79 77  AST 15 - 41 U/L 21 20 19   ALT 14 - 54 U/L 16 12(L) 13(L)    PENDING LABS:    DIAGNOSTIC IMAGING:  *The following radiologic images and reports have been reviewed independently and agree with below findings.  Most recent mammogram: 03/28/16      PATHOLOGY:  (R) lumpectomy surgical path: 06/10/14            ASSESSMENT & PLAN:   Stage 0 right breast DCIS, ER+/PR-: -Diagnosed in 03/2014. Treated with right breast lumpectomy in 05/2014. She declined adjuvant radiation therapy, but did want to try anti-estrogen therapy. She started Tamoxifen in 07/2014; her anti-estrogen course was complicated by abnormal vaginal bleeding in 04/2015. Gynecologic evaluation revealed endometrial polyps with benign pathology. Patient resumed Tamoxifen on her own in 06/2015; vaginal bleeding recurred and led to permanent discontinuation of Tamoxifen in 09/2015.  She opted against trying another anti-estrogen agent and elected to move forward with surveillance alone.  -Clinical breast exam performed today and is benign.  -Last mammogram in 03/2016 negative for malignancy; due for annual diagnostic mammogram in 03/2017; orders placed today.  -She is now 2.5 years out from her breast cancer diagnosis without evidence of recurrence, which is favorable. I proposed moving the patient to annual follow-ups in June and mammography in December each year. This surveillance schedule will allow for every 6 month evaluation of her breasts with clinical breast exam and  mammogram.  She agreed with this plan.   Bone health:  -Last DEXA scan we have on record is from 10/06/11, which revealed osteopenia with T-score -1.2.  -Since she has declined additional anti-estrogen therapy that could affect her bone density from a breast cancer standpoint, I will defer any future DEXA scan imaging to her PCP as they deem clinically indicated.   Bilat LE edema and (R) leg pain:  -Symptoms suspicious for claudication/peripheral vascular disease.  -Negative Homan's sign on physical exam.  I have low clinical suspicion of DVT; offered sending patient for bilateral doppler ultrasound to r/o DVT. She declines and prefers to follow-up with her PCP regarding these symptoms.   Intermittent vaginal bleeding:  -Has been chronic and largely unchanged.  -Continue follow-up with gynecology as directed.      Dispo:  -Mammogram due in 03/2017; orders placed today.  -Return to cancer center in 1 year for follow-up. No labs necessary given h/o non-invasive breast cancer.    All questions were answered to patient's stated satisfaction. Encouraged patient to call with any new concerns or questions before her next visit to the cancer center and we can certain see her sooner, if needed.    Plan of care discussed with Dr. Talbert Cage, who agrees with the above aforementioned.    Orders placed this encounter:  Orders Placed This Encounter  Procedures  . MM DIAG BREAST TOMO BILATERAL      Mike Craze, NP [place] [phone]

## 2016-10-05 ENCOUNTER — Other Ambulatory Visit (HOSPITAL_COMMUNITY): Payer: PPO

## 2016-10-05 ENCOUNTER — Encounter (HOSPITAL_COMMUNITY): Payer: PPO | Attending: Adult Health | Admitting: Adult Health

## 2016-10-05 ENCOUNTER — Encounter (HOSPITAL_COMMUNITY): Payer: Self-pay | Admitting: Adult Health

## 2016-10-05 ENCOUNTER — Ambulatory Visit (HOSPITAL_COMMUNITY): Payer: PPO | Admitting: Oncology

## 2016-10-05 VITALS — BP 144/78 | HR 94 | Temp 99.6°F | Resp 16 | Ht 62.0 in | Wt 163.2 lb

## 2016-10-05 DIAGNOSIS — R600 Localized edema: Secondary | ICD-10-CM | POA: Diagnosis not present

## 2016-10-05 DIAGNOSIS — Z86000 Personal history of in-situ neoplasm of breast: Secondary | ICD-10-CM

## 2016-10-05 DIAGNOSIS — M79604 Pain in right leg: Secondary | ICD-10-CM

## 2016-10-05 DIAGNOSIS — N939 Abnormal uterine and vaginal bleeding, unspecified: Secondary | ICD-10-CM | POA: Diagnosis not present

## 2016-10-05 DIAGNOSIS — D0511 Intraductal carcinoma in situ of right breast: Secondary | ICD-10-CM

## 2016-10-05 NOTE — Patient Instructions (Signed)
[place] at [facility] Discharge Instructions  RECOMMENDATIONS MADE BY THE CONSULTANT AND ANY TEST RESULTS WILL BE SENT TO YOUR REFERRING PHYSICIAN.  You were seen today by Mike Craze NP. Your mammogram is due in December of this year. Return in 1 year for follow up.   Thank you for choosing [place] at [facility] to provide your oncology and hematology care.  To afford each patient quality time with our provider, please arrive at least 15 minutes before your scheduled appointment time.    If you have a lab appointment with the [facility] please come in thru the  Main Entrance and check in at the main information desk  You need to re-schedule your appointment should you arrive 10 or more minutes late.  We strive to give you quality time with our providers, and arriving late affects you and other patients whose appointments are after yours.  Also, if you no show three or more times for appointments you may be dismissed from the clinic at the providers discretion.     Again, thank you for choosing [facility].  Our hope is that these requests will decrease the amount of time that you wait before being seen by our physicians.       _____________________________________________________________  Should you have questions after your visit to [facility], please contact our office at [phone] between the hours of 8:30 a.m. and 4:30 p.m.  Voicemails left after 4:30 p.m. will not be returned until the following business day.  For prescription refill requests, have your pharmacy contact our office.       Resources For Cancer Patients and their Caregivers ? American Cancer Society: Can assist with transportation, wigs, general needs, runs Look Good Feel Better.        980-804-1002 ? Cancer Care: Provides financial assistance, online support groups, medication/co-pay assistance.  1-800-813-HOPE  253 244 6196) ? [place] Assists [place] cancer patients and their families through emotional , educational and financial support.  828-837-3089 ? [place] DSS Where to apply for food stamps, Medicaid and utility assistance. (440)605-2936 ? [facility]: Transportation to medical appointments. (732) 261-0215 ? Social Security Administration: May apply for disability if have a Stage IV cancer. 320-397-8517 281-853-1346 ? [employer], Disability and Transit Services: Assists with nutrition, care and transit needs. [place] Support Programs: @10RELATIVEDAYS @ > Cancer Support Group  2nd Tuesday of the month 1pm-2pm, Journey Room  > Creative Journey  3rd Tuesday of the month 1130am-1pm, Journey Room  > Look Good Feel Better  1st Wednesday of the month 10am-12 noon, Journey Room (Call [facility] to register [phone])

## 2016-11-16 ENCOUNTER — Ambulatory Visit: Payer: PPO | Admitting: Obstetrics & Gynecology

## 2016-12-01 ENCOUNTER — Ambulatory Visit (INDEPENDENT_AMBULATORY_CARE_PROVIDER_SITE_OTHER): Payer: PPO | Admitting: Obstetrics & Gynecology

## 2016-12-01 ENCOUNTER — Encounter (INDEPENDENT_AMBULATORY_CARE_PROVIDER_SITE_OTHER): Payer: Self-pay

## 2016-12-01 ENCOUNTER — Encounter: Payer: Self-pay | Admitting: Obstetrics & Gynecology

## 2016-12-01 VITALS — BP 120/80 | HR 80 | Wt 158.3 lb

## 2016-12-01 DIAGNOSIS — N813 Complete uterovaginal prolapse: Secondary | ICD-10-CM | POA: Diagnosis not present

## 2016-12-01 DIAGNOSIS — Z4689 Encounter for fitting and adjustment of other specified devices: Secondary | ICD-10-CM

## 2016-12-01 NOTE — Progress Notes (Signed)
Chief Complaint  Patient presents with  . Pessary Check    clean   Blood pressure 120/80, pulse 80, weight 158 lb 4.8 oz (71.8 kg).   [name] is in today for her routine maintenance of her pessary She currently uses a Milex ring with support #3 and has for many years She denies any vaginal discharge or vaginal bleeding  Exam today is benign no vaginal mucosal breakdown discharge or bleeding is noted  The pessary is removed cleaned and replaced without difficulty  We will see Melissa Deleon back in 4 months for continued follow-up or sooner if needed  12/01/2016 10:16 AM

## 2017-01-19 DIAGNOSIS — M170 Bilateral primary osteoarthritis of knee: Secondary | ICD-10-CM | POA: Diagnosis not present

## 2017-01-19 DIAGNOSIS — G894 Chronic pain syndrome: Secondary | ICD-10-CM | POA: Diagnosis not present

## 2017-01-19 DIAGNOSIS — M1711 Unilateral primary osteoarthritis, right knee: Secondary | ICD-10-CM | POA: Diagnosis not present

## 2017-01-19 DIAGNOSIS — Z1389 Encounter for screening for other disorder: Secondary | ICD-10-CM | POA: Diagnosis not present

## 2017-01-19 DIAGNOSIS — E663 Overweight: Secondary | ICD-10-CM | POA: Diagnosis not present

## 2017-01-19 DIAGNOSIS — R7309 Other abnormal glucose: Secondary | ICD-10-CM | POA: Diagnosis not present

## 2017-01-19 DIAGNOSIS — Z23 Encounter for immunization: Secondary | ICD-10-CM | POA: Diagnosis not present

## 2017-01-19 DIAGNOSIS — I10 Essential (primary) hypertension: Secondary | ICD-10-CM | POA: Diagnosis not present

## 2017-01-19 DIAGNOSIS — Z6829 Body mass index (BMI) 29.0-29.9, adult: Secondary | ICD-10-CM | POA: Diagnosis not present

## 2017-01-19 DIAGNOSIS — E782 Mixed hyperlipidemia: Secondary | ICD-10-CM | POA: Diagnosis not present

## 2017-01-19 DIAGNOSIS — E785 Hyperlipidemia, unspecified: Secondary | ICD-10-CM | POA: Diagnosis not present

## 2017-01-22 ENCOUNTER — Other Ambulatory Visit (HOSPITAL_COMMUNITY): Payer: Self-pay | Admitting: Family Medicine

## 2017-01-22 ENCOUNTER — Ambulatory Visit (HOSPITAL_COMMUNITY)
Admission: RE | Admit: 2017-01-22 | Discharge: 2017-01-22 | Disposition: A | Discharge status: Home / Self Care | Payer: PPO | Source: Ambulatory Visit | Attending: Family Medicine | Admitting: Family Medicine

## 2017-01-22 DIAGNOSIS — M1711 Unilateral primary osteoarthritis, right knee: Secondary | ICD-10-CM | POA: Insufficient documentation

## 2017-01-22 DIAGNOSIS — M25561 Pain in right knee: Secondary | ICD-10-CM | POA: Diagnosis not present

## 2017-01-22 DIAGNOSIS — G894 Chronic pain syndrome: Secondary | ICD-10-CM

## 2017-01-22 DIAGNOSIS — M11261 Other chondrocalcinosis, right knee: Secondary | ICD-10-CM | POA: Diagnosis not present

## 2017-03-16 DIAGNOSIS — M810 Age-related osteoporosis without current pathological fracture: Secondary | ICD-10-CM | POA: Diagnosis not present

## 2017-03-16 DIAGNOSIS — Z0001 Encounter for general adult medical examination with abnormal findings: Secondary | ICD-10-CM | POA: Diagnosis not present

## 2017-03-16 DIAGNOSIS — Z6830 Body mass index (BMI) 30.0-30.9, adult: Secondary | ICD-10-CM | POA: Diagnosis not present

## 2017-03-16 DIAGNOSIS — G894 Chronic pain syndrome: Secondary | ICD-10-CM | POA: Diagnosis not present

## 2017-03-16 DIAGNOSIS — E782 Mixed hyperlipidemia: Secondary | ICD-10-CM | POA: Diagnosis not present

## 2017-03-16 DIAGNOSIS — I10 Essential (primary) hypertension: Secondary | ICD-10-CM | POA: Diagnosis not present

## 2017-03-16 DIAGNOSIS — Z1389 Encounter for screening for other disorder: Secondary | ICD-10-CM | POA: Diagnosis not present

## 2017-03-16 DIAGNOSIS — N3941 Urge incontinence: Secondary | ICD-10-CM | POA: Diagnosis not present

## 2017-03-16 DIAGNOSIS — M1711 Unilateral primary osteoarthritis, right knee: Secondary | ICD-10-CM | POA: Diagnosis not present

## 2017-03-16 DIAGNOSIS — E6609 Other obesity due to excess calories: Secondary | ICD-10-CM | POA: Diagnosis not present

## 2017-03-16 DIAGNOSIS — Z23 Encounter for immunization: Secondary | ICD-10-CM | POA: Diagnosis not present

## 2017-03-16 DIAGNOSIS — M170 Bilateral primary osteoarthritis of knee: Secondary | ICD-10-CM | POA: Diagnosis not present

## 2017-03-16 DIAGNOSIS — R7309 Other abnormal glucose: Secondary | ICD-10-CM | POA: Diagnosis not present

## 2017-03-27 ENCOUNTER — Encounter (HOSPITAL_COMMUNITY): Payer: PPO

## 2017-04-02 ENCOUNTER — Ambulatory Visit: Payer: PPO | Admitting: Obstetrics & Gynecology

## 2017-04-05 ENCOUNTER — Other Ambulatory Visit: Payer: Self-pay

## 2017-04-05 ENCOUNTER — Ambulatory Visit (INDEPENDENT_AMBULATORY_CARE_PROVIDER_SITE_OTHER): Payer: PPO | Admitting: Obstetrics & Gynecology

## 2017-04-05 ENCOUNTER — Encounter: Payer: Self-pay | Admitting: Obstetrics & Gynecology

## 2017-04-05 VITALS — BP 154/92 | HR 103 | Ht 62.0 in | Wt 156.0 lb

## 2017-04-05 DIAGNOSIS — Z4689 Encounter for fitting and adjustment of other specified devices: Secondary | ICD-10-CM | POA: Diagnosis not present

## 2017-04-05 DIAGNOSIS — N813 Complete uterovaginal prolapse: Secondary | ICD-10-CM | POA: Diagnosis not present

## 2017-04-12 ENCOUNTER — Ambulatory Visit (HOSPITAL_COMMUNITY)
Admission: RE | Admit: 2017-04-12 | Discharge: 2017-04-12 | Disposition: A | Discharge status: Home / Self Care | Payer: PPO | Source: Ambulatory Visit | Attending: Adult Health | Admitting: Adult Health

## 2017-04-12 DIAGNOSIS — D0511 Intraductal carcinoma in situ of right breast: Secondary | ICD-10-CM | POA: Diagnosis present

## 2017-04-12 DIAGNOSIS — Z853 Personal history of malignant neoplasm of breast: Secondary | ICD-10-CM | POA: Insufficient documentation

## 2017-04-12 DIAGNOSIS — R922 Inconclusive mammogram: Secondary | ICD-10-CM | POA: Diagnosis not present

## 2017-04-29 NOTE — Progress Notes (Signed)
   Patient ID: Melissa Deleon, female   DOB: 1933-08-19, 84 y.o.   MRN: [id number]  Chief Complaint  Patient presents with  . Follow-up    pessary   Melissa Deleon comes in today for routine maintenance related to her pessary she Currently uses a Milex ring with support #3 and has for many many years She is doing well without any complaints specifically no vaginal discharge or bleeding  Blood pressure (!) 154/92, pulse (!) 103, height 5\' 2"  (1.575 m), weight 156 lb (70.8 kg).   Exam today reveals normal external genitalia the pessary is seated in proper location and removed without difficulty  The pessary is cleaned with warm water and soap It is replaced in the vagina without difficulty The vagina is without any mucosal breakdown bleeding or evidence of excessive discharge  Melissa Deleon will be seen back in 4 months for ongoing going pessary maintenance or earlier if needed

## 2017-06-13 ENCOUNTER — Other Ambulatory Visit: Payer: Self-pay | Admitting: Obstetrics & Gynecology

## 2017-06-27 DIAGNOSIS — M722 Plantar fascial fibromatosis: Secondary | ICD-10-CM | POA: Diagnosis not present

## 2017-06-27 DIAGNOSIS — E663 Overweight: Secondary | ICD-10-CM | POA: Diagnosis not present

## 2017-06-27 DIAGNOSIS — R7309 Other abnormal glucose: Secondary | ICD-10-CM | POA: Diagnosis not present

## 2017-06-27 DIAGNOSIS — Z6827 Body mass index (BMI) 27.0-27.9, adult: Secondary | ICD-10-CM | POA: Diagnosis not present

## 2017-08-02 ENCOUNTER — Ambulatory Visit: Payer: PPO | Admitting: Obstetrics & Gynecology

## 2017-08-02 ENCOUNTER — Encounter (INDEPENDENT_AMBULATORY_CARE_PROVIDER_SITE_OTHER): Payer: Self-pay

## 2017-08-02 ENCOUNTER — Other Ambulatory Visit: Payer: Self-pay

## 2017-08-02 ENCOUNTER — Encounter: Payer: Self-pay | Admitting: Obstetrics & Gynecology

## 2017-08-02 VITALS — BP 134/80 | HR 83 | Ht 62.0 in | Wt 149.0 lb

## 2017-08-02 DIAGNOSIS — N813 Complete uterovaginal prolapse: Secondary | ICD-10-CM | POA: Diagnosis not present

## 2017-08-02 DIAGNOSIS — Z4689 Encounter for fitting and adjustment of other specified devices: Secondary | ICD-10-CM

## 2017-08-03 ENCOUNTER — Ambulatory Visit: Payer: PPO | Admitting: Obstetrics & Gynecology

## 2017-09-26 NOTE — Progress Notes (Signed)
Chief Complaint  Patient presents with  . pessary maintanence    Blood pressure 134/80, pulse 83, height 5\' 2"  (1.575 m), weight 149 lb (67.6 kg).  Melissa Deleon presents today for routine follow up related to her pessary.   She uses a milex ring with support #3 She reports no vaginal discharge or vaginal bleeding.  Exam reveals no undue vaginal mucosal pressure of breakdown, no discharge and no vaginal bleeding.  The pessary is removed, cleaned and replaced without difficulty.    Melissa Deleon will be sen back in 4 months for continued follow up.

## 2017-10-02 ENCOUNTER — Ambulatory Visit (HOSPITAL_COMMUNITY): Payer: PPO | Admitting: Internal Medicine

## 2017-10-02 ENCOUNTER — Encounter (HOSPITAL_COMMUNITY): Payer: Self-pay

## 2017-10-02 ENCOUNTER — Ambulatory Visit (HOSPITAL_COMMUNITY): Payer: PPO | Admitting: Adult Health

## 2017-11-05 ENCOUNTER — Other Ambulatory Visit (HOSPITAL_COMMUNITY): Payer: Self-pay | Admitting: Family Medicine

## 2017-11-05 ENCOUNTER — Ambulatory Visit (HOSPITAL_COMMUNITY)
Admission: RE | Admit: 2017-11-05 | Discharge: 2017-11-05 | Disposition: A | Discharge status: Home / Self Care | Payer: PPO | Source: Ambulatory Visit | Attending: Family Medicine | Admitting: Family Medicine

## 2017-11-05 DIAGNOSIS — M7989 Other specified soft tissue disorders: Secondary | ICD-10-CM | POA: Diagnosis not present

## 2017-11-05 DIAGNOSIS — M25561 Pain in right knee: Secondary | ICD-10-CM | POA: Diagnosis not present

## 2017-11-05 DIAGNOSIS — M79661 Pain in right lower leg: Secondary | ICD-10-CM | POA: Diagnosis not present

## 2017-11-05 DIAGNOSIS — E663 Overweight: Secondary | ICD-10-CM | POA: Diagnosis not present

## 2017-11-05 DIAGNOSIS — M1991 Primary osteoarthritis, unspecified site: Secondary | ICD-10-CM | POA: Diagnosis not present

## 2017-11-05 DIAGNOSIS — Z6828 Body mass index (BMI) 28.0-28.9, adult: Secondary | ICD-10-CM | POA: Diagnosis not present

## 2017-11-05 DIAGNOSIS — M2550 Pain in unspecified joint: Secondary | ICD-10-CM | POA: Diagnosis present

## 2017-12-03 ENCOUNTER — Encounter: Payer: Self-pay | Admitting: Obstetrics & Gynecology

## 2017-12-03 ENCOUNTER — Other Ambulatory Visit: Payer: Self-pay

## 2017-12-03 ENCOUNTER — Ambulatory Visit: Payer: PPO | Admitting: Obstetrics & Gynecology

## 2017-12-03 VITALS — BP 147/88 | HR 81 | Ht 62.0 in | Wt 147.0 lb

## 2017-12-03 DIAGNOSIS — N813 Complete uterovaginal prolapse: Secondary | ICD-10-CM

## 2017-12-03 DIAGNOSIS — Z4689 Encounter for fitting and adjustment of other specified devices: Secondary | ICD-10-CM | POA: Diagnosis not present

## 2017-12-03 NOTE — Progress Notes (Signed)
Chief Complaint  Patient presents with  . pessary maintenance    Blood pressure (!) 147/88, pulse 81, height 5\' 2"  (1.575 m), weight 147 lb (66.7 kg).  Melissa Deleon presents today for routine follow up related to her pessary.   She uses a milex ring with support #3 She reports no vaginal discharge or vaginal bleeding.  Exam reveals no undue vaginal mucosal pressure of breakdown, no discharge and little vaginal bleeding.  The pessary is removed, cleaned and replaced without difficulty.    Melissa Deleon will be sen back in 4 months for continued follow up.  Florian Buff, MD  12/03/2017 9:17 AM

## 2017-12-24 DIAGNOSIS — M25561 Pain in right knee: Secondary | ICD-10-CM | POA: Diagnosis not present

## 2017-12-24 DIAGNOSIS — I10 Essential (primary) hypertension: Secondary | ICD-10-CM | POA: Diagnosis not present

## 2017-12-24 DIAGNOSIS — N3281 Overactive bladder: Secondary | ICD-10-CM | POA: Diagnosis not present

## 2017-12-24 DIAGNOSIS — N814 Uterovaginal prolapse, unspecified: Secondary | ICD-10-CM | POA: Diagnosis not present

## 2017-12-24 DIAGNOSIS — Z6827 Body mass index (BMI) 27.0-27.9, adult: Secondary | ICD-10-CM | POA: Diagnosis not present

## 2018-01-09 DIAGNOSIS — N993 Prolapse of vaginal vault after hysterectomy: Secondary | ICD-10-CM | POA: Diagnosis not present

## 2018-01-09 DIAGNOSIS — I10 Essential (primary) hypertension: Secondary | ICD-10-CM | POA: Diagnosis not present

## 2018-01-09 DIAGNOSIS — M25561 Pain in right knee: Secondary | ICD-10-CM | POA: Diagnosis not present

## 2018-01-09 DIAGNOSIS — Z23 Encounter for immunization: Secondary | ICD-10-CM | POA: Diagnosis not present

## 2018-01-09 DIAGNOSIS — Z0000 Encounter for general adult medical examination without abnormal findings: Secondary | ICD-10-CM | POA: Diagnosis not present

## 2018-01-09 DIAGNOSIS — Z6826 Body mass index (BMI) 26.0-26.9, adult: Secondary | ICD-10-CM | POA: Diagnosis not present

## 2018-01-21 ENCOUNTER — Ambulatory Visit: Payer: PPO | Admitting: Orthopedic Surgery

## 2018-02-26 ENCOUNTER — Encounter: Payer: Self-pay | Admitting: Orthopedic Surgery

## 2018-04-02 ENCOUNTER — Encounter: Payer: Self-pay | Admitting: Obstetrics & Gynecology

## 2018-04-02 ENCOUNTER — Encounter (INDEPENDENT_AMBULATORY_CARE_PROVIDER_SITE_OTHER): Payer: Self-pay

## 2018-04-02 ENCOUNTER — Ambulatory Visit (INDEPENDENT_AMBULATORY_CARE_PROVIDER_SITE_OTHER): Payer: PPO | Admitting: Obstetrics & Gynecology

## 2018-04-02 VITALS — BP 151/86 | HR 83 | Ht 62.0 in | Wt 150.0 lb

## 2018-04-02 DIAGNOSIS — Z4689 Encounter for fitting and adjustment of other specified devices: Secondary | ICD-10-CM | POA: Diagnosis not present

## 2018-04-02 NOTE — Progress Notes (Signed)
Chief Complaint  Patient presents with  . Pessary cleaning    Blood pressure (!) 151/86, pulse 83, height 5\' 2"  (1.575 m), weight 150 lb (68 kg).  Melissa Deleon presents today for routine follow up related to her pessary.   She uses a Milex ring with support #4 It felt as if it is a little large so I refit and replaced her pessary with a #3 She reports little vaginal discharge or vaginal bleeding.  Exam reveals no undue vaginal mucosal pressure of breakdown, no discharge and little vaginal bleeding.  The pessary is removed, cleaned and replaced without difficulty.    Melissa Deleon will be sen back in 4 months for continued follow up.  Florian Buff, MD  04/02/2018 9:20 AM

## 2018-06-19 ENCOUNTER — Other Ambulatory Visit: Payer: Self-pay | Admitting: Obstetrics & Gynecology

## 2018-08-02 ENCOUNTER — Ambulatory Visit: Payer: Self-pay | Admitting: Obstetrics & Gynecology

## 2018-08-05 ENCOUNTER — Telehealth: Payer: Self-pay | Admitting: *Deleted

## 2018-08-05 NOTE — Telephone Encounter (Signed)
No answer and no voicemail @ 4:03 pm. Unable to let pt know about Covid-19 restrictions. [place]

## 2018-08-06 ENCOUNTER — Ambulatory Visit: Payer: Medicare Other | Admitting: Obstetrics & Gynecology

## 2018-08-06 ENCOUNTER — Encounter: Payer: Self-pay | Admitting: Obstetrics & Gynecology

## 2018-08-06 ENCOUNTER — Other Ambulatory Visit: Payer: Self-pay

## 2018-08-06 VITALS — BP 179/94 | HR 89 | Wt 154.0 lb

## 2018-08-06 DIAGNOSIS — N813 Complete uterovaginal prolapse: Secondary | ICD-10-CM

## 2018-08-06 DIAGNOSIS — Z4689 Encounter for fitting and adjustment of other specified devices: Secondary | ICD-10-CM | POA: Diagnosis not present

## 2018-08-06 NOTE — Progress Notes (Signed)
Chief Complaint  Patient presents with  . Pessary cleaning    Blood pressure (!) 179/94, pulse 89, weight 154 lb (69.9 kg).  Melissa Deleon presents today for routine follow up related to her pessary.   She uses a Milex ring with support #4 She reports no vaginal discharge or vaginal bleeding.  Exam reveals no undue vaginal mucosal pressure of breakdown, no discharge and little vaginal bleeding.  The pessary is removed, cleaned and replaced without difficulty.    Melissa Deleon will be sen back in 4 months for continued follow up.  Continue Detrol LA 4 mg for urge incontinence  Florian Buff, MD  08/06/2018 9:16 AM

## 2018-10-24 ENCOUNTER — Telehealth: Payer: Self-pay | Admitting: *Deleted

## 2018-10-24 NOTE — Telephone Encounter (Signed)
Insurance denied Detrol.  States she needs to try 2 covered drugs Myrbetriq, Oxybutynin ER, Solifenacin or

## 2018-10-25 ENCOUNTER — Other Ambulatory Visit: Payer: Self-pay | Admitting: Obstetrics & Gynecology

## 2018-10-25 MED ORDER — OXYBUTYNIN CHLORIDE ER 10 MG PO TB24
10.0000 mg | ORAL_TABLET | Freq: Every day | ORAL | 11 refills | Status: DC
Start: 2018-10-25 — End: 2018-12-13

## 2018-10-25 NOTE — Telephone Encounter (Signed)
Pharmacy calling and pt would like to try the Oxybutynin ER.

## 2018-10-25 NOTE — Telephone Encounter (Signed)
Ok done

## 2018-11-04 DIAGNOSIS — I10 Essential (primary) hypertension: Secondary | ICD-10-CM | POA: Diagnosis not present

## 2018-11-07 DIAGNOSIS — M810 Age-related osteoporosis without current pathological fracture: Secondary | ICD-10-CM | POA: Diagnosis not present

## 2018-11-07 DIAGNOSIS — I10 Essential (primary) hypertension: Secondary | ICD-10-CM | POA: Diagnosis not present

## 2018-11-07 DIAGNOSIS — Z0000 Encounter for general adult medical examination without abnormal findings: Secondary | ICD-10-CM | POA: Diagnosis not present

## 2018-11-07 DIAGNOSIS — M25561 Pain in right knee: Secondary | ICD-10-CM | POA: Diagnosis not present

## 2018-12-05 ENCOUNTER — Telehealth: Payer: Self-pay | Admitting: Obstetrics & Gynecology

## 2018-12-05 NOTE — Telephone Encounter (Signed)
Tried to reach patient to remind her of her appointment/restrictions, no answer or machine.

## 2018-12-06 ENCOUNTER — Ambulatory Visit: Payer: Medicare Other | Admitting: Obstetrics & Gynecology

## 2018-12-06 ENCOUNTER — Other Ambulatory Visit: Payer: Self-pay

## 2018-12-06 ENCOUNTER — Encounter: Payer: Self-pay | Admitting: Obstetrics & Gynecology

## 2018-12-06 VITALS — BP 185/92 | HR 85 | Ht 62.0 in | Wt 186.0 lb

## 2018-12-06 DIAGNOSIS — N813 Complete uterovaginal prolapse: Secondary | ICD-10-CM | POA: Diagnosis not present

## 2018-12-06 DIAGNOSIS — Z4689 Encounter for fitting and adjustment of other specified devices: Secondary | ICD-10-CM

## 2018-12-06 NOTE — Progress Notes (Signed)
Chief Complaint  Patient presents with  . Pessary Check    cleaning    Blood pressure (!) 185/92, pulse 85, height 5\' 2"  (1.575 m), weight 186 lb (84.4 kg).  Melissa Deleon presents today for routine follow up related to her pessary.   She uses a Milex ring with support #4 She reports no vaginal discharge or vaginal bleeding.  Exam reveals no undue vaginal mucosal pressure of breakdown, no discharge and no vaginal bleeding.  The pessary is removed, cleaned and replaced without difficulty.   Pt is not doing well with the ditropan xl and her insurance will not pay for the detrol LA so she will stop the ditropan and see how she does without anything.  If she needs to restart we will try a PA for the detrol LA  Melissa Deleon will be sen back in 4 months for continued follow up.  Florian Buff, MD  12/06/2018 9:27 AM

## 2018-12-11 ENCOUNTER — Telehealth: Payer: Self-pay | Admitting: Obstetrics & Gynecology

## 2018-12-11 NOTE — Telephone Encounter (Signed)
Patient's daughter Jonathon Resides [phone] called, stated the patient called her rambling and she stated that she is bleeding alot and would like to try and get detrol LA.  According to Dr. Brynda Greathouse note, insurance will not pay for this, but he would try a PA to get it.  [place]

## 2018-12-13 ENCOUNTER — Telehealth: Payer: Self-pay | Admitting: Obstetrics & Gynecology

## 2018-12-13 MED ORDER — TOLTERODINE TARTRATE ER 4 MG PO CP24: 4.0000 mg | ORAL_CAPSULE | Freq: Every day | ORAL | 11 refills | Status: DC

## 2019-04-04 ENCOUNTER — Telehealth: Payer: Self-pay | Admitting: Obstetrics & Gynecology

## 2019-04-04 NOTE — Telephone Encounter (Signed)
Called patient regarding appointment scheduled in our office and advised to come alone to the visits, however, a support person, over age 83, may accompany her  to appointment if assistance is needed for safety or care concerns. Otherwise, support persons should remain outside until the visit is complete.   We ask if you have had any exposure to anyone suspected or confirmed of having COVID-19, are awaiting test results for COVID-19 or if you are experiencing any of the following, to call and reschedule your appointment: fever, cough, shortness of breath, muscle pain, diarrhea, rash, vomiting, abdominal pain, red eye, weakness, bruising, bleeding, joint pain, or a severe headache.   Please know we will ask you these questions or similar questions when you arrive for your appointment and again it's how we are keeping everyone safe.    Also,to keep you safe, please use the provided hand sanitizer when you enter the office. We are asking everyone in the office to wear a mask to help prevent the spread of germs. If you have a mask of your own, please wear it to your appointment, if not, we are happy to provide one for you.  Thank you for understanding and your cooperation.    [facility] Staff    

## 2019-04-07 ENCOUNTER — Ambulatory Visit: Payer: Medicare Other | Admitting: Obstetrics & Gynecology

## 2019-04-17 ENCOUNTER — Ambulatory Visit: Payer: Medicare Other | Admitting: Obstetrics & Gynecology

## 2019-04-21 ENCOUNTER — Telehealth: Payer: Self-pay | Admitting: Obstetrics & Gynecology

## 2019-04-21 NOTE — Telephone Encounter (Signed)
Called patient regarding appointment and the following message was left: ° ° °We have you scheduled for an upcoming appointment at our office. At this time, we are still not allowing visitors during the appointment, however, a support person, over age 84, may accompany you to your appointment if assistance is needed for safety or care concerns. Otherwise, support persons should remain outside until the visit is complete.  ° °We ask if you are sick, have any symptoms of COVID, have had any exposure to anyone suspected or confirmed of having COVID-19, or are awaiting test results for COVID-19, to call our office as we may need to reschedule you for a virtual visit or schedule your appointment for a later date.   ° °Please know we will ask you these questions or similar questions when you arrive for your appointment and understand this is how we are keeping everyone safe.   ° °Also,to keep you safe, please use the provided hand sanitizer when you enter the office. We are asking everyone in the office to wear a mask to help prevent the spread of °germs. If you have a mask of your own, please wear it to your appointment, if not, we are happy to provide one for you. ° °Thank you for understanding and your cooperation.  ° ° °[facility] Staff ° ° ° ° ° °

## 2019-04-22 ENCOUNTER — Encounter: Payer: Self-pay | Admitting: Obstetrics & Gynecology

## 2019-04-22 ENCOUNTER — Ambulatory Visit: Payer: PPO | Admitting: Obstetrics & Gynecology

## 2019-04-22 ENCOUNTER — Other Ambulatory Visit: Payer: Self-pay

## 2019-04-22 VITALS — BP 169/93 | HR 98 | Ht 62.0 in | Wt 156.0 lb

## 2019-04-22 DIAGNOSIS — N813 Complete uterovaginal prolapse: Secondary | ICD-10-CM

## 2019-04-22 DIAGNOSIS — Z4689 Encounter for fitting and adjustment of other specified devices: Secondary | ICD-10-CM

## 2019-04-22 NOTE — Progress Notes (Signed)
Chief Complaint  Patient presents with  . 4 month follow-up    pessary    Blood pressure (!) 169/93, pulse 98, height 5\' 2"  (1.575 m), weight 156 lb (70.8 kg).  Melissa Deleon presents today for routine follow up related to her pessary.   She uses a Milex ring with support #2 She reports no vaginal discharge or vaginal bleeding.  Exam reveals no undue vaginal mucosal pressure of breakdown, no discharge and little vaginal bleeding.  The pessary is removed, cleaned and replaced without difficulty.    Melissa Deleon will be sen back in 4 months for continued follow up.  Florian Buff, MD  04/22/2019 9:09 AM

## 2019-06-09 DIAGNOSIS — M79605 Pain in left leg: Secondary | ICD-10-CM | POA: Diagnosis not present

## 2019-06-09 DIAGNOSIS — M79661 Pain in right lower leg: Secondary | ICD-10-CM | POA: Diagnosis not present

## 2019-06-17 DIAGNOSIS — N3281 Overactive bladder: Secondary | ICD-10-CM | POA: Diagnosis not present

## 2019-06-17 DIAGNOSIS — Z712 Person consulting for explanation of examination or test findings: Secondary | ICD-10-CM | POA: Diagnosis not present

## 2019-06-17 DIAGNOSIS — Z0000 Encounter for general adult medical examination without abnormal findings: Secondary | ICD-10-CM | POA: Diagnosis not present

## 2019-06-17 DIAGNOSIS — N993 Prolapse of vaginal vault after hysterectomy: Secondary | ICD-10-CM | POA: Diagnosis not present

## 2019-06-17 DIAGNOSIS — Z6828 Body mass index (BMI) 28.0-28.9, adult: Secondary | ICD-10-CM | POA: Diagnosis not present

## 2019-06-17 DIAGNOSIS — I10 Essential (primary) hypertension: Secondary | ICD-10-CM | POA: Diagnosis not present

## 2019-06-17 DIAGNOSIS — Z6827 Body mass index (BMI) 27.0-27.9, adult: Secondary | ICD-10-CM | POA: Diagnosis not present

## 2019-06-17 DIAGNOSIS — M25561 Pain in right knee: Secondary | ICD-10-CM | POA: Diagnosis not present

## 2019-06-17 DIAGNOSIS — N814 Uterovaginal prolapse, unspecified: Secondary | ICD-10-CM | POA: Diagnosis not present

## 2019-06-17 DIAGNOSIS — M810 Age-related osteoporosis without current pathological fracture: Secondary | ICD-10-CM | POA: Diagnosis not present

## 2019-06-17 DIAGNOSIS — R600 Localized edema: Secondary | ICD-10-CM | POA: Diagnosis not present

## 2019-06-17 DIAGNOSIS — Z23 Encounter for immunization: Secondary | ICD-10-CM | POA: Diagnosis not present

## 2019-06-23 DIAGNOSIS — N993 Prolapse of vaginal vault after hysterectomy: Secondary | ICD-10-CM | POA: Diagnosis not present

## 2019-06-23 DIAGNOSIS — R600 Localized edema: Secondary | ICD-10-CM | POA: Diagnosis not present

## 2019-06-23 DIAGNOSIS — I10 Essential (primary) hypertension: Secondary | ICD-10-CM | POA: Diagnosis not present

## 2019-06-23 DIAGNOSIS — R7303 Prediabetes: Secondary | ICD-10-CM | POA: Diagnosis not present

## 2019-06-23 DIAGNOSIS — E782 Mixed hyperlipidemia: Secondary | ICD-10-CM | POA: Diagnosis not present

## 2019-06-23 DIAGNOSIS — N3281 Overactive bladder: Secondary | ICD-10-CM | POA: Diagnosis not present

## 2019-06-23 DIAGNOSIS — M25561 Pain in right knee: Secondary | ICD-10-CM | POA: Diagnosis not present

## 2019-06-23 DIAGNOSIS — M810 Age-related osteoporosis without current pathological fracture: Secondary | ICD-10-CM | POA: Diagnosis not present

## 2019-06-24 ENCOUNTER — Other Ambulatory Visit (HOSPITAL_COMMUNITY): Payer: Self-pay | Admitting: Internal Medicine

## 2019-06-24 DIAGNOSIS — Z1231 Encounter for screening mammogram for malignant neoplasm of breast: Secondary | ICD-10-CM

## 2019-06-24 DIAGNOSIS — M8580 Other specified disorders of bone density and structure, unspecified site: Secondary | ICD-10-CM

## 2019-08-19 ENCOUNTER — Ambulatory Visit: Payer: PPO | Admitting: Obstetrics & Gynecology

## 2019-08-25 ENCOUNTER — Other Ambulatory Visit (HOSPITAL_COMMUNITY): Payer: Self-pay | Admitting: Internal Medicine

## 2019-08-25 ENCOUNTER — Ambulatory Visit (HOSPITAL_COMMUNITY)
Admission: RE | Admit: 2019-08-25 | Discharge: 2019-08-25 | Disposition: A | Discharge status: Home / Self Care | Payer: PPO | Source: Ambulatory Visit | Attending: Internal Medicine | Admitting: Internal Medicine

## 2019-08-25 ENCOUNTER — Other Ambulatory Visit: Payer: Self-pay

## 2019-08-25 DIAGNOSIS — M898X1 Other specified disorders of bone, shoulder: Secondary | ICD-10-CM

## 2019-08-25 DIAGNOSIS — M25511 Pain in right shoulder: Secondary | ICD-10-CM | POA: Diagnosis not present

## 2019-08-25 DIAGNOSIS — S4991XA Unspecified injury of right shoulder and upper arm, initial encounter: Secondary | ICD-10-CM | POA: Diagnosis not present

## 2019-08-25 DIAGNOSIS — R079 Chest pain, unspecified: Secondary | ICD-10-CM | POA: Diagnosis not present

## 2019-08-25 DIAGNOSIS — M25561 Pain in right knee: Secondary | ICD-10-CM | POA: Diagnosis not present

## 2019-08-25 DIAGNOSIS — F0391 Unspecified dementia with behavioral disturbance: Secondary | ICD-10-CM | POA: Diagnosis not present

## 2019-09-12 ENCOUNTER — Encounter: Payer: Self-pay | Admitting: Counselor

## 2019-09-23 DIAGNOSIS — F0391 Unspecified dementia with behavioral disturbance: Secondary | ICD-10-CM | POA: Diagnosis not present

## 2019-09-23 DIAGNOSIS — M25561 Pain in right knee: Secondary | ICD-10-CM | POA: Diagnosis not present

## 2019-09-23 DIAGNOSIS — R079 Chest pain, unspecified: Secondary | ICD-10-CM | POA: Diagnosis not present

## 2019-09-23 DIAGNOSIS — I739 Peripheral vascular disease, unspecified: Secondary | ICD-10-CM | POA: Diagnosis not present

## 2019-10-15 ENCOUNTER — Ambulatory Visit: Payer: PPO | Admitting: Psychology

## 2019-10-15 ENCOUNTER — Encounter: Payer: Self-pay | Admitting: Counselor

## 2019-10-15 ENCOUNTER — Other Ambulatory Visit: Payer: Self-pay

## 2019-10-15 ENCOUNTER — Ambulatory Visit (INDEPENDENT_AMBULATORY_CARE_PROVIDER_SITE_OTHER): Payer: PPO | Admitting: Counselor

## 2019-10-15 DIAGNOSIS — F22 Delusional disorders: Secondary | ICD-10-CM

## 2019-10-15 DIAGNOSIS — R441 Visual hallucinations: Secondary | ICD-10-CM

## 2019-10-15 DIAGNOSIS — F0391 Unspecified dementia with behavioral disturbance: Secondary | ICD-10-CM | POA: Diagnosis not present

## 2019-10-15 DIAGNOSIS — F09 Unspecified mental disorder due to known physiological condition: Secondary | ICD-10-CM

## 2019-10-15 NOTE — Progress Notes (Signed)
[facility]  Patient Name: Melissa Deleon MRN: [id number] Date of Birth: Jun 15, 1933 Age: 86 y.o. Education: 9 years  Referral Circumstances and Background Information  Melissa Deleon is a 86 y.o., right-hand dominant, widowed female with a history of advanced dementia with delusions from the sound of it. She is a patient of Dr. Juel Burrow, who referred her for evaluation and management. Her daughter stated that she first noticed changes last summer. The patient's daughter stated that the the first changes noticed were personality changes and the patient "cussing her out" when her husband died, 10 years ago. That has progressed over time and at present, she is having formed visual hallucinations of individuals invading her space with accompanying delusions (thinks they are stealing things). Her daughter was fearful to talk in front of her mother, because she apparently gets very agitated, but we were able to talk while the patient underwent testing. She initially was accusing her daughter of taking her things but in January, 2021, she started having hallucinations and blaming it on them. Her daughter reported that she is completely fixated on her delusions and worries/talks about them most of the day. She also thought that these people put a child in her bed, which she took care of until the morning when they came and got her. She has poor insight and thinks these things actually happened. She also has significant cognitive problems, she reported that the patient repeats herself and does not retain much information at all. It was apparent that she also has problems recalling things from the remote past on interview, because she was not able to recall even basic aspects of her personal history. They have not noticed any dream enactment behavior. She is tired a lot, she is often sleeping much of the day. They think she has been losing weight, about 30lbs in the past 2 to 3  months. She apparently eats well when she is with her daughter.   With respect to movement problems, the patient has been shuffling for several years. She has been using a walker at night to reposition herself in the bed and occasionally within the house, for about a year and a half at least. They haven't noticed any tremors. She has occasional falls, she fell backward in her yard once although she hasn't fallen since then.   With respect to functioning, the patient lives alone although her daughter is providing assistance with many things. She took over the patients finances in March, 2021. The patient said she was having problems writing out checks. She stopped driving approximately 4 years ago, which they said was because of vision, although it may represent self-restriction of driving abilities (patient was getting nervous as per her daughter). She is apparently reliable about taking her medications although she is only on one apparently from her gynecologist/urologist. It looks from the medical record as though she was on other medications from Dr. Nevada Crane so I will encourage her daughter to get in touch with his office. She was previously taking blood pressure medication and Aricept and her daughter wasn't sure if those have been discontinued or not. Apparently, she is good at taking care of her house and still keeps it up although not to her previous standard. Her daughter stated that she is still able to go grocery shopping although she is not able to go through the register without help. She is able to care for most of her personal hygiene needs, she stated that the patient  dresses herself. She isn't sure if she is reliable with showering but she does not smell and appears adequately clean. She has issues with urinary incontinence although she is using a pessary. There is no fecal incontinence. Her daughter said she is still able to cook although that is questionable given her presentation in clinic today.    Past Medical History and Review of Relevant Studies   Patient Active Problem List   Diagnosis Date Noted  . Ductal carcinoma in situ (DCIS) of right breast 08/13/2014  . Chronic renal disease, stage 3, moderately decreased glomerular filtration rate (GFR) between 30-59 mL/min/1.73 square meter 08/13/2014  . Urge incontinence 10/08/2012  . Uterovaginal prolapse, complete 10/08/2012  . HYPERTENSION, UNSPECIFIED 10/22/2008  . ARTHRITIS, CHRONIC 10/22/2008    Review of Neuroimaging and Relevant Medical History: No neuroimaging on file for review.   Current Outpatient Medications  Medication Sig Dispense Refill  . acetaminophen (TYLENOL) 500 MG tablet Take 500 mg by mouth every 6 (six) hours as needed.    Marland Kitchen lisinopril-hydrochlorothiazide (PRINZIDE,ZESTORETIC) 20-25 MG per tablet Take 1 tablet by mouth daily.    Marland Kitchen tolterodine (DETROL LA) 4 MG 24 hr capsule Take 1 capsule (4 mg total) by mouth daily. At bedtime (Patient not taking: Reported on 04/22/2019) 30 capsule 11   No current facility-administered medications for this visit.    Family History  Problem Relation Age of Onset  . Diabetes Mother   . Heart attack Father   . Cancer Sister        breast   There is no  family history of dementia. There is no  family history of psychiatric illness.  Psychosocial History  Developmental, Educational and Employment History: The patient is a native of [place]. She reported that she did well in school, although she didn't sound as though she really knew. She left in the 8th or 9th grade because she got married, but again, that is as per the patient. 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 Her daughter thinks she stopped working around 8.   Psychiatric History: None  Substance Use History: The patient has never been a smoker, does not drink, and does not use drugs.   Relationship History and Living Cimcumstances: The patient was married for  many years, she wasn't sure how many years she was married, around 58 years. Her daughter Melissa Deleon is her only child.   Mental Status and Behavioral Observations  Sensorium/Arousal: The patient's level of arousal was awake and alert. Hearing and vision were adequate for testing purposes. Orientation: The patient was oriented to person only, wouldn't hazard a guess as to the year, month, date, or season. She also was unsure what state we were in although she was aware that she lives in [place].  Appearance: Dressed in appropriate, casual clothing with adequate grooming and hygiene.  Behavior: The patient was quite impaired and did get agitated easily at her daughter.  Speech/language: Normal in rate, rhythm, and volume Gait/Posture: The patient walked with small steps, was very slow, and appeared somewhat unsteady.  Movement: Perhaps mildly hypokinetic/bradykinetic though no masked facies.  Social Comportment: The patient participated in the encounter to the best of her ability Mood: "Good"  Affect: Mainly neutral but easily agitated Thought process/content: The patient's thought process was sticky and she seemed to have a hard time with redirection. Would often get caught up in her thoughts. Thought content was notable for delusions,  as above.  Safety: No thoughts of harming self or others Insight: Significant anosognosia  MMSE - Mini Mental State Exam 10/15/2019  Orientation to time 0  Orientation to Place 3  Registration 3  Attention/ Calculation 0  Recall 0  Language- name 2 objects 2  Language- repeat 0  Language- follow 3 step command 3  Language- read & follow direction 1  Write a sentence 1  Copy design 0  Total score 13    Test Procedures  Repeatable Battery for the Assessment of Neuropsychological Status (Form A) Clock Drawing Controlled Oral Word Association (F-A-S) Semantic Fluency (Animals) Complex Ideational Material Geriatric Depression Scale - Short Form Quick  Dementia Rating System (completed by completed by daughter, Melissa Deleon)  Plan  Melissa Deleon was seen for a psychiatric diagnostic evaluation and neuropsychological testing. She is an 86 year old, right-hand dominant patient of Dr. Durene Cal with at least moderate dementia, presenting recently with delusions and hallucinations. Her daughter noticed personality changes as far back as 10 years ago but stated that her significant difficulties with memory and thinking really didn't start until last Summer. There may be some insensitivity towards noticing cognitive changes. She is screening at a late moderate dementia level and testing needed to be curtailed given limited tolerance and ability to engage. I counseled her daughter on the differential and issues that need to be considered, including safe living circumstances, which for this patient likely require a skilled nursing level of care. She has clearly lost her ability to independently make medical decisions and has no spouse and as such, I will follow up with her daughter alone so as to avoid agitating her, in one week. Full and complete note with impressions, recommendations, and interpretation of test data to follow.   Melissa Deleon Melissa Kindred, PsyD, Melissa Deleon  Informed Consent and Coding/Compliance  Risks and benefits of the evaluation were discussed with the patient prior to all testing procedures. I conducted a clinical interview and neuropsychological testing (at least two tests) with Melissa Deleon and Melissa Deleon, B.S. (Technician) assisted me in administering additional test procedures. The patient was able to tolerate the testing procedures and the patient (and/or family if applicable) is likely to benefit from further follow up to receive the diagnosis and treatment recommendations, which will be rendered at the next encounter. Billing below reflects technician time, my direct face-to-face time with the patient, time spent in test  administration, and time spent in professional activities including but not limited to: neuropsychological test interpretation, integration of neuropsychological test data with clinical history, report preparation, treatment planning, care coordination, and review of diagnostically pertinent medical history or studies.   Services associated with this encounter: Clinical Interview [phone]) plus 60 minutes (56433; Neuropsychological Evaluation by Professional)  60 minutes (29518; Neuropsychological Evaluation by Professional, Adl.) 30 minutes (84166; Test Administration by Professional) 30 minutes (06301; Neuropsychological Testing by Technician) 25 minutes (60109; Neuropsychological Testing by Technician, Adl.)

## 2019-10-15 NOTE — Progress Notes (Signed)
Psychometrist Note   Cognitive testing was administered to Melissa Deleon by Milana Kidney, B.S. (Technician) under the supervision of Alphonzo Severance, Psy.D., ABN. Melissa Deleon was able to tolerate all test procedures. Dr. Nicole Kindred met with the patient as needed to manage any emotional reactions to the testing procedures. Rest breaks were offered.    The battery of tests administered was selected by Dr. Nicole Kindred with consideration to the patient's current level of functioning, the nature of her symptoms, emotional and behavioral responses during the interview, level of literacy, observed level of motivation/effort, and the nature of the referral question. This battery was communicated to the psychometrist. Communication between Dr. Nicole Kindred and the psychometrist was ongoing throughout the evaluation and Dr. Nicole Kindred was immediately accessible at all times. Dr. Nicole Kindred provided supervision to the technician on the date of this service, to the extent necessary to assure the quality of all services provided.    Melissa Deleon will return in approximately one week for an interactive feedback session with Dr. Nicole Kindred, at which time female test performance, clinical impressions, and treatment recommendations will be reviewed in detail. The patient understands she can contact our office should she require our assistance before this time.   A total of 50 minutes of billable time were spent with Melissa Deleon by the technician, including test administration and scoring time. Billing for these services is reflected in Dr. Les Pou note.   This note reflects time spent with the psychometrician and does not include test scores, clinical history, or any interpretations made by Dr. Nicole Kindred. The full report will follow in a separate note.

## 2019-10-21 NOTE — Progress Notes (Signed)
     [facility]  Patient Name: Melissa Deleon MRN: [id number] Date of Birth: [birth year] Age: 86 y.o. Education: 9 years  Measurement properties of test scores: IQ, Index, and Standard Scores (SS): Mean = 100; Standard Deviation = 15 Scaled Scores (Ss): Mean = 10; Standard Deviation = 3 Z scores (Z): Mean = 0; Standard Deviation = 1 T scores (T); Mean = 50; Standard Deviation = 10  TEST SCORES:    Note: This summary of test scores accompanies the interpretive report and should not be interpreted by unqualified individuals or in isolation without reference to the report. Test scores are relative to age, gender, and educational history as available and appropriate.   Mental Status Screening     Total Score Descriptor  MMSE 13 Moderate Dementia      Cognitive Testing        RBANS, Form : Standard/Scaled Score Percentile  Total Score 45 <1  Immediate Memory 49 <1      List Learning 2 <1      Story Memory 2 <1  Visuospatial/Constructional 50 <1      Figure Copy   (4) 1 <1      Line Orientation --- <2  Language 54 <1      Picture Naming --- <2      Semantic Fluency 2 <1  Attention 53 <1      Digit Span 5 5      Coding 1 <1  Delayed Memory 40 <1      List Recall   (0) --- <2      List Recognition   (9) --- <2      Story Recall   (1) 3 1      Figure Recall   (0) 1 <1      Verbal Fluency: T-score Percentile      Controlled Oral Word Association (F-A-S) 21 <1      Semantic Fluency (Animals) 16 <1      Boston Diagnostic Aphasia Exam: Raw Score Scaled Score      Complex Ideational Material 4 2      Clock Drawing Raw Score Descriptor      Command 2 Severe Impairment      Rating Scales        Clinical Dementia Rating Raw Score Descriptor      Sum of Boxes 11 Moderate Dementia      Global Score 2 Moderate Dementia      Quick Dementia Rating System Raw Score Descriptor      Sum of Boxes 9 Mild Dementia      Total Score 18 Severe  Dementia  Geriatric Depression Scale - Short Form 0 WNL   Tiara Maultsby V. Nicole Kindred PsyD, [place] Clinical Neuropsychologist

## 2019-10-22 DIAGNOSIS — I10 Essential (primary) hypertension: Secondary | ICD-10-CM | POA: Diagnosis not present

## 2019-10-22 DIAGNOSIS — Z6828 Body mass index (BMI) 28.0-28.9, adult: Secondary | ICD-10-CM | POA: Diagnosis not present

## 2019-10-22 DIAGNOSIS — N390 Urinary tract infection, site not specified: Secondary | ICD-10-CM | POA: Diagnosis not present

## 2019-10-22 DIAGNOSIS — Z23 Encounter for immunization: Secondary | ICD-10-CM | POA: Diagnosis not present

## 2019-10-22 DIAGNOSIS — R7303 Prediabetes: Secondary | ICD-10-CM | POA: Diagnosis not present

## 2019-10-22 DIAGNOSIS — M810 Age-related osteoporosis without current pathological fracture: Secondary | ICD-10-CM | POA: Diagnosis not present

## 2019-10-22 DIAGNOSIS — E782 Mixed hyperlipidemia: Secondary | ICD-10-CM | POA: Diagnosis not present

## 2019-10-22 DIAGNOSIS — Z712 Person consulting for explanation of examination or test findings: Secondary | ICD-10-CM | POA: Diagnosis not present

## 2019-10-22 DIAGNOSIS — N3281 Overactive bladder: Secondary | ICD-10-CM | POA: Diagnosis not present

## 2019-10-22 DIAGNOSIS — M25561 Pain in right knee: Secondary | ICD-10-CM | POA: Diagnosis not present

## 2019-10-22 DIAGNOSIS — R079 Chest pain, unspecified: Secondary | ICD-10-CM | POA: Diagnosis not present

## 2019-10-22 DIAGNOSIS — N814 Uterovaginal prolapse, unspecified: Secondary | ICD-10-CM | POA: Diagnosis not present

## 2019-10-22 DIAGNOSIS — F0391 Unspecified dementia with behavioral disturbance: Secondary | ICD-10-CM | POA: Diagnosis not present

## 2019-10-22 DIAGNOSIS — I739 Peripheral vascular disease, unspecified: Secondary | ICD-10-CM | POA: Diagnosis not present

## 2019-10-24 ENCOUNTER — Other Ambulatory Visit: Payer: Self-pay

## 2019-10-24 ENCOUNTER — Ambulatory Visit (INDEPENDENT_AMBULATORY_CARE_PROVIDER_SITE_OTHER): Payer: PPO | Admitting: Counselor

## 2019-10-24 ENCOUNTER — Encounter: Payer: Self-pay | Admitting: Counselor

## 2019-10-24 DIAGNOSIS — F0391 Unspecified dementia with behavioral disturbance: Secondary | ICD-10-CM | POA: Diagnosis not present

## 2019-10-24 DIAGNOSIS — F22 Delusional disorders: Secondary | ICD-10-CM | POA: Diagnosis not present

## 2019-10-24 DIAGNOSIS — F03B18 Unspecified dementia, moderate, with other behavioral disturbance: Secondary | ICD-10-CM

## 2019-10-24 DIAGNOSIS — R441 Visual hallucinations: Secondary | ICD-10-CM

## 2019-10-24 DIAGNOSIS — G301 Alzheimer's disease with late onset: Secondary | ICD-10-CM | POA: Diagnosis not present

## 2019-10-24 DIAGNOSIS — F0281 Dementia in other diseases classified elsewhere with behavioral disturbance: Secondary | ICD-10-CM

## 2019-10-24 DIAGNOSIS — F02818 Dementia in other diseases classified elsewhere, unspecified severity, with other behavioral disturbance: Secondary | ICD-10-CM

## 2019-10-24 NOTE — Patient Instructions (Signed)
We discussed your mother Melissa Deleon's diagnosis, which is moderate dementia. At this stage, individuals are typically unable to make decisions for themselves and that is my impression in your mother's case. Her behavior is significantly influenced by delusions, she is not able to consistently express a preference or care for needs such as taking medications, and thus a higher level of care is warranted in my opinion.   Dementia refers to a group of syndromes where multiple areas of ability are damaged in the brain, such as memory, thinking, judgment, and behavior, and most commonly refers to age related causes of dementia that cause worsening in these abilities over time. Alzheimer's disease is the most common form of dementia in people over the age of 22. Not all dementias are Alzheimer's disease, but all Alzheimer's disease is dementia. When dementia is due to an underlying condition affecting the brain, such as Alzheimer's disease, there is progression over time, which typically procedes gradually over many years.   In Melissa Deleon's case, I think her dementia is likely due to a combination of Alzheimer's disease and Lewy Body Disease, although it could just be moderately advanced Alzheimer's disease. That distinction will not change her prognosis as she is expected to worsen over time.   We discussed safe levels of care for individuals at this stage of functioning, which include settings with near 24 hour supervision, such as skilled nursing or dementia care. You wanted to try assisted living, although I encouraged you to do so at a location where multiple levels of care are available because I think she likely needs more than an assisted living level of care.   If your mother is not amenable to transitioning to a safe placement, then you may need to pursue guardianship or otherwise make sure that her needs are met. I would recommend that you consult with an elder care attorney for legal advice about your options.  I can provide my strong recommendation for the [employer], who are located at [address] in [place], [place]. They can be reached at [phone]. They also have a website [email or web address].   We discussed ways of managing behaviors in dementia. Instead of medications, I recommend behavioral strategies for dealing with the behavioral and psychological issues that can accompany dementia. Things like agitation, wandering, and anxiety can often be improved or eliminated using the "three R's." Redirection (help distract your loved one by focusing their attention on something else, moving them to a new environment, or otherwise engaging them in something other than what is distressing to them), Reassurance (reassure them that you are there to take care of them and that there is nothing they need to be worried about), and Reconsidering (consider the situation from their perspective and try to identify if there is something about the situation or environment that may be triggering their reaction).  I have included a handout from the Lewy Body Dementia association on managing behavioral changes.

## 2019-10-24 NOTE — Progress Notes (Signed)
[facility]  Patient Name: DEJANEE THIBEAUX MRN: [id number] Date of Birth: 07-29-1933 Age: 86 y.o. Education: 9 years  Clinical Impressions & Recommendations  GIZELL DANSER is a 86 y.o., right-hand dominant, widowed woman with a history of advanced dementia with delusions. Her daughter stated that she first noticed changes last summer, although the patient stopped driving about 4 years ago and has personality changes going back as far as 10 years ago (e.e., being "mean and nasty") so it likely started before then. She has deteriorated significantly over the past year or so and in January, 2021 began to have delusions that people were coming into her house, stealing things, and on one occasion also thought that they left a child in her bed. She is completely fixated on these delusions as per her daughter and they are greatly disturbing to her. She does see these people, as per her daughter. She has significant functional impairment and hasn't been able to do things like manage finances or shop in the community independently for some time.   On neuropsychological assessment, Ms. Beulah Gandy presented as globally and significantly impaired, without any real areas of preserved performance. She has advanced memory storage problems, naming problems, and deficits in other areas. She achieved a 13 on the MMSE, which is a moderate dementia level, and also has remote memory loss with difficulties recalling details of her personal history. I was able to rate a CDR for her and her sum of boxes score is 11, her global score is 2, which is moderate dementia.   Presentation consistent with neurodegenerative dementia, currently at a late moderate level of progression. She has no neuroimaging and is quite progressed rendering the clinical picture less clear, although I favor mixed Alzheimer's and Lewy Body Disease given her demographics and clinical characteristics. Appropriate care  setting for individuals at this level of functioning include home with near 24 hour supervision and skilled nursing; I will counsel her daughter on the need for increased supervision. She is not safe to drive and should continue to refrain from driving. I will discuss behavior management strategies with her daughter, because she is quite touchy and easily aggravated. Would recommend starting Aricept and adjuvent Namenda could also be considered.   Diagnostic Impressions: Moderate dementia Alzheimer's disease, late onset, with behavioral disturbance Formed visual hallucination  Test Findings  Test scores are summarized in additional documentation associated with this encounter. Test scores are relative to age, gender, and educational history as available and appropriate. There were no concerns about performance validity as the patient had no external incentive for poor performance and appeared to be adequately engaged in the testing.   General Intellectual Functioning/Achievement:  Average to low average present as reasonable standards of comparison for this patient's cognitive test data given her demographics and level of premorbid educational/occupational attainment.   Attention and Processing Efficiency: Performance was impaired on measures of attention and processing efficiency with unusually low digit repetition forward and extremely low timed number-symbol coding. Simple numeric sequencing was also extremely low and that is a very easy test.   Language: Performance on language measures was impaired across the board with extremely low visual object confrontation naming and extremely low fluency both in response to the letters F-A-S and two different category prompts. Category fluency was marginally worse than phonemic fluency.   Visuospatial Function: Performance on visuospatial measures was markedly and significantly impaired with an extremely low score on the overall index. Qualitatively,  her  rendition on figure copy bore little resemblance to the figure she was asked to copy and was grossly distorted, despite being given the maximum amount of time to work on the task. Judgment of angular line orientations was also extremely low.   Learning and Memory: Performance on measures of learning and memory was markedly impaired with difficulty learning new information and rapid forgetting of information across time. Immediate and delayed recall were extremely low, both for visual and verbal material.   Executive Functions: Performance on measures of executive abilities was extremely low across the board on generation of words in response to the letters F-A-S and when reasoning with complex verbal information. Clock drawing was suggestive of "severe impairment" with no hands and marked errors in placement of the numbers with missing numbers.   Rating Scale(s): The patient's daughter characterized her as functioning anywhere from a mild to severe dementia level. I was able to rate a CDR for her and her global score is a 2, which is moderate dementia. She screened negative for the presence of depression.    Viviano Simas Nicole Kindred PsyD, [place] Clinical Neuropsychologist

## 2019-10-24 NOTE — Progress Notes (Signed)
NEUROPSYCHOLOGY FEEDBACK NOTE [facility]  Telemedicine statement:  I discussed the limitations of neuropsychological care via telemedicine and the availability of in person appointments. The patient's daughter expressed understanding and agreed to proceed.  The visit modality was: telephonic The patient location was: home The provider location was: office  The following individuals participated: Melissa Deleon  Feedback Note: I met with Melissa Deleon's daughter Melissa to review the findings resulting from her neuropsychological evaluation. Because of the patient's level of impairment, impaired decision making capacity and moderately advanced dementia, we elected not to include her in the feedback session so as to avoid agitation. Since the last appointment, her daughter has been looking at different placements. Time was spent reviewing the impressions and recommendations that are detailed in the evaluation report. I discussed the impression of moderate level dementia (perhaps late moderate) and the need for increased supervision. She was hoping for assisted living although I think at this patient's level of impairment with her delusions, she will require significant supervision and skilled nursing or dementia care may be more appropriate. They may consider trying a facility with multiple levels of care. We discussed strategies for managing problematic behavior in dementia, which was supplemented by a handout on the same. Also referred her to [facility] because she will likely need to go for guardianship if the patient resists placement. I took time to explain the findings and answer all the patient's questions. I encouraged Melissa Deleon to contact me should she have any further questions or if further follow up is desired.   Current Medications and Medical History   Current Outpatient Medications  Medication Sig Dispense Refill  . acetaminophen (TYLENOL) 500 MG tablet Take 500  mg by mouth every 6 (six) hours as needed.    . lisinopril-hydrochlorothiazide (PRINZIDE,ZESTORETIC) 20-25 MG per tablet Take 1 tablet by mouth daily.    . tolterodine (DETROL LA) 4 MG 24 hr capsule Take 1 capsule (4 mg total) by mouth daily. At bedtime (Patient not taking: Reported on 04/22/2019) 30 capsule 11   No current facility-administered medications for this visit.    Patient Active Problem List   Diagnosis Date Noted  . Ductal carcinoma in situ (DCIS) of right breast 08/13/2014  . Chronic renal disease, stage 3, moderately decreased glomerular filtration rate (GFR) between 30-59 mL/min/1.73 square meter 08/13/2014  . Urge incontinence 10/08/2012  . Uterovaginal prolapse, complete 10/08/2012  . HYPERTENSION, UNSPECIFIED 10/22/2008  . ARTHRITIS, CHRONIC 10/22/2008    Mental Status and Behavioral Observations  Mental status not obtained, because the patient is not competent to participate at this point.   Plan  Feedback provided regarding the patient's neuropsychological evaluation. This patient has lost her ability to independently make decisions and requires a higher level of care, which her daughter is working on. I will send her daughter a copy of the feedback note and recommendations in addition to referrals for [facility] and information about managing problematic behaviors in dementia. Melissa Deleon was encouraged to contact me if any questions arise or if further follow up is desired for her mother.    V. , PsyD, ABN Clinical Neuropsychologist  Service(s) Provided at This Encounter: 32 minutes ([id number]; Family therapy without patient present)   

## 2019-11-10 DIAGNOSIS — N814 Uterovaginal prolapse, unspecified: Secondary | ICD-10-CM | POA: Diagnosis not present

## 2019-11-10 DIAGNOSIS — I739 Peripheral vascular disease, unspecified: Secondary | ICD-10-CM | POA: Diagnosis not present

## 2019-11-10 DIAGNOSIS — R7303 Prediabetes: Secondary | ICD-10-CM | POA: Diagnosis not present

## 2019-11-10 DIAGNOSIS — R079 Chest pain, unspecified: Secondary | ICD-10-CM | POA: Diagnosis not present

## 2019-11-10 DIAGNOSIS — Z6828 Body mass index (BMI) 28.0-28.9, adult: Secondary | ICD-10-CM | POA: Diagnosis not present

## 2019-11-10 DIAGNOSIS — M25561 Pain in right knee: Secondary | ICD-10-CM | POA: Diagnosis not present

## 2019-11-10 DIAGNOSIS — B078 Other viral warts: Secondary | ICD-10-CM | POA: Diagnosis not present

## 2019-11-10 DIAGNOSIS — I10 Essential (primary) hypertension: Secondary | ICD-10-CM | POA: Diagnosis not present

## 2019-11-10 DIAGNOSIS — R1084 Generalized abdominal pain: Secondary | ICD-10-CM | POA: Diagnosis not present

## 2019-11-10 DIAGNOSIS — Z23 Encounter for immunization: Secondary | ICD-10-CM | POA: Diagnosis not present

## 2019-11-10 DIAGNOSIS — E782 Mixed hyperlipidemia: Secondary | ICD-10-CM | POA: Diagnosis not present

## 2019-11-10 DIAGNOSIS — N3281 Overactive bladder: Secondary | ICD-10-CM | POA: Diagnosis not present

## 2019-11-10 DIAGNOSIS — M810 Age-related osteoporosis without current pathological fracture: Secondary | ICD-10-CM | POA: Diagnosis not present

## 2019-11-10 DIAGNOSIS — Z712 Person consulting for explanation of examination or test findings: Secondary | ICD-10-CM | POA: Diagnosis not present

## 2019-11-10 DIAGNOSIS — F0391 Unspecified dementia with behavioral disturbance: Secondary | ICD-10-CM | POA: Diagnosis not present

## 2020-02-17 ENCOUNTER — Encounter: Payer: Self-pay | Admitting: Obstetrics & Gynecology

## 2020-02-17 ENCOUNTER — Ambulatory Visit: Payer: PPO | Admitting: Obstetrics & Gynecology

## 2020-02-17 VITALS — BP 150/85 | HR 98 | Ht <= 58 in | Wt 119.5 lb

## 2020-02-17 DIAGNOSIS — Z4689 Encounter for fitting and adjustment of other specified devices: Secondary | ICD-10-CM | POA: Diagnosis not present

## 2020-02-17 DIAGNOSIS — N813 Complete uterovaginal prolapse: Secondary | ICD-10-CM | POA: Diagnosis not present

## 2020-02-17 MED ORDER — TOLTERODINE TARTRATE ER 4 MG PO CP24: 4.0000 mg | ORAL_CAPSULE | Freq: Every day | ORAL | 11 refills | Status: AC

## 2020-02-17 NOTE — Addendum Note (Signed)
Addended by: Florian Buff on: 02/17/2020 02:35 PM   Modules accepted: Orders

## 2020-02-17 NOTE — Progress Notes (Signed)
Chief Complaint  Patient presents with  . Pessary Check    stoamch pain off and on; has noticed some vaginal spotting    Blood pressure (!) 150/85, pulse 98, height 4\' 10"  (1.473 m), weight 119 lb 8 oz (54.2 kg).  Melissa Deleon presents today for routine follow up related to her pessary.   She uses a Milex ring with suport #2 She reports no vaginal discharge or vaginal bleeding.  Exam reveals no undue vaginal mucosal pressure of breakdown, no discharge and no vaginal bleeding.  The pessary is removed, cleaned and replaced without difficulty.    Melissa Deleon will be sen back in 4 months for continued follow up.  Florian Buff, MD  02/17/2020 2:29 PM

## 2020-05-16 ENCOUNTER — Inpatient Hospital Stay (HOSPITAL_COMMUNITY): Payer: PPO

## 2020-05-16 ENCOUNTER — Observation Stay (HOSPITAL_COMMUNITY)
Admission: EM | Admit: 2020-05-16 | Discharge: 2020-05-17 | Disposition: A | Discharge status: Home / Self Care | Payer: PPO | Attending: Family Medicine | Admitting: Family Medicine

## 2020-05-16 ENCOUNTER — Emergency Department (HOSPITAL_COMMUNITY): Payer: PPO

## 2020-05-16 ENCOUNTER — Encounter (HOSPITAL_COMMUNITY): Payer: Self-pay | Admitting: Emergency Medicine

## 2020-05-16 ENCOUNTER — Other Ambulatory Visit: Payer: Self-pay

## 2020-05-16 DIAGNOSIS — Y9301 Activity, walking, marching and hiking: Secondary | ICD-10-CM | POA: Insufficient documentation

## 2020-05-16 DIAGNOSIS — I129 Hypertensive chronic kidney disease with stage 1 through stage 4 chronic kidney disease, or unspecified chronic kidney disease: Secondary | ICD-10-CM | POA: Insufficient documentation

## 2020-05-16 DIAGNOSIS — G9341 Metabolic encephalopathy: Secondary | ICD-10-CM | POA: Diagnosis not present

## 2020-05-16 DIAGNOSIS — S065XAA Traumatic subdural hemorrhage with loss of consciousness status unknown, initial encounter: Secondary | ICD-10-CM | POA: Insufficient documentation

## 2020-05-16 DIAGNOSIS — I10 Essential (primary) hypertension: Secondary | ICD-10-CM | POA: Diagnosis not present

## 2020-05-16 DIAGNOSIS — Z20822 Contact with and (suspected) exposure to covid-19: Secondary | ICD-10-CM | POA: Diagnosis not present

## 2020-05-16 DIAGNOSIS — G300 Alzheimer's disease with early onset: Secondary | ICD-10-CM | POA: Diagnosis not present

## 2020-05-16 DIAGNOSIS — W010XXA Fall on same level from slipping, tripping and stumbling without subsequent striking against object, initial encounter: Secondary | ICD-10-CM | POA: Diagnosis not present

## 2020-05-16 DIAGNOSIS — Y92010 Kitchen of single-family (private) house as the place of occurrence of the external cause: Secondary | ICD-10-CM | POA: Diagnosis not present

## 2020-05-16 DIAGNOSIS — S065X9A Traumatic subdural hemorrhage with loss of consciousness of unspecified duration, initial encounter: Secondary | ICD-10-CM | POA: Diagnosis not present

## 2020-05-16 DIAGNOSIS — S2241XA Multiple fractures of ribs, right side, initial encounter for closed fracture: Secondary | ICD-10-CM | POA: Diagnosis not present

## 2020-05-16 DIAGNOSIS — N183 Chronic kidney disease, stage 3 unspecified: Secondary | ICD-10-CM | POA: Diagnosis not present

## 2020-05-16 DIAGNOSIS — Z853 Personal history of malignant neoplasm of breast: Secondary | ICD-10-CM | POA: Insufficient documentation

## 2020-05-16 DIAGNOSIS — N390 Urinary tract infection, site not specified: Secondary | ICD-10-CM | POA: Diagnosis present

## 2020-05-16 DIAGNOSIS — F0280 Dementia in other diseases classified elsewhere without behavioral disturbance: Secondary | ICD-10-CM | POA: Diagnosis not present

## 2020-05-16 DIAGNOSIS — S0003XA Contusion of scalp, initial encounter: Secondary | ICD-10-CM | POA: Diagnosis present

## 2020-05-16 DIAGNOSIS — W19XXXA Unspecified fall, initial encounter: Secondary | ICD-10-CM | POA: Diagnosis not present

## 2020-05-16 DIAGNOSIS — S0990XA Unspecified injury of head, initial encounter: Secondary | ICD-10-CM | POA: Diagnosis present

## 2020-05-16 DIAGNOSIS — N3941 Urge incontinence: Secondary | ICD-10-CM | POA: Diagnosis present

## 2020-05-16 DIAGNOSIS — S065X0A Traumatic subdural hemorrhage without loss of consciousness, initial encounter: Secondary | ICD-10-CM | POA: Diagnosis not present

## 2020-05-16 DIAGNOSIS — G309 Alzheimer's disease, unspecified: Secondary | ICD-10-CM | POA: Diagnosis present

## 2020-05-16 DIAGNOSIS — M25561 Pain in right knee: Secondary | ICD-10-CM | POA: Diagnosis not present

## 2020-05-16 DIAGNOSIS — S2249XA Multiple fractures of ribs, unspecified side, initial encounter for closed fracture: Secondary | ICD-10-CM | POA: Diagnosis present

## 2020-05-16 DIAGNOSIS — S80919A Unspecified superficial injury of unspecified knee, initial encounter: Secondary | ICD-10-CM | POA: Diagnosis not present

## 2020-05-16 DIAGNOSIS — M25551 Pain in right hip: Secondary | ICD-10-CM | POA: Diagnosis not present

## 2020-05-16 DIAGNOSIS — I629 Nontraumatic intracranial hemorrhage, unspecified: Secondary | ICD-10-CM

## 2020-05-16 DIAGNOSIS — Z043 Encounter for examination and observation following other accident: Secondary | ICD-10-CM | POA: Diagnosis not present

## 2020-05-16 DIAGNOSIS — S0083XA Contusion of other part of head, initial encounter: Secondary | ICD-10-CM | POA: Diagnosis not present

## 2020-05-16 LAB — CBC
HCT: 42.9 % (ref 36.0–46.0)
Hemoglobin: 13.2 g/dL (ref 12.0–15.0)
MCH: 28.8 pg (ref 26.0–34.0)
MCHC: 30.8 g/dL (ref 30.0–36.0)
MCV: 93.7 fL (ref 80.0–100.0)
Platelets: 255 10*3/uL (ref 150–400)
RBC: 4.58 MIL/uL (ref 3.87–5.11)
RDW: 12.6 % (ref 11.5–15.5)
WBC: 7.7 10*3/uL (ref 4.0–10.5)
nRBC: 0 % (ref 0.0–0.2)

## 2020-05-16 LAB — COMPREHENSIVE METABOLIC PANEL
ALT: 19 U/L (ref 0–44)
AST: 26 U/L (ref 15–41)
Albumin: 3.8 g/dL (ref 3.5–5.0)
Alkaline Phosphatase: 87 U/L (ref 38–126)
Anion gap: 7 (ref 5–15)
BUN: 17 mg/dL (ref 8–23)
CO2: 28 mmol/L (ref 22–32)
Calcium: 9.5 mg/dL (ref 8.9–10.3)
Chloride: 102 mmol/L (ref 98–111)
Creatinine, Ser: 0.75 mg/dL (ref 0.44–1.00)
GFR, Estimated: >60 mL/min (ref >60)
Glucose, Bld: 86 mg/dL (ref 70–99)
Potassium: 4.2 mmol/L (ref 3.5–5.1)
Sodium: 137 mmol/L (ref 135–145)
Total Bilirubin: 0.4 mg/dL (ref 0.3–1.2)
Total Protein: 7 g/dL (ref 6.5–8.1)

## 2020-05-16 MED ORDER — FESOTERODINE FUMARATE ER 4 MG PO TB24
8.0000 mg | ORAL_TABLET | Freq: Every day | ORAL | Status: DC
  Administered 2020-05-16 – 2020-05-17 (×2): 8 mg via ORAL
  Filled 2020-05-16: qty 2
  Filled 2020-05-16 (×4): qty 1

## 2020-05-16 MED ORDER — DONEPEZIL HCL 5 MG PO TABS
5.0000 mg | ORAL_TABLET | Freq: Every day | ORAL | Status: DC
  Administered 2020-05-16: 5 mg via ORAL
  Filled 2020-05-16 (×2): qty 1

## 2020-05-16 MED ORDER — FENTANYL CITRATE (PF) 100 MCG/2ML IJ SOLN
50.0000 ug | INTRAMUSCULAR | Status: DC | PRN
Start: 2020-05-16 — End: 2020-05-17
  Administered 2020-05-16 – 2020-05-17 (×2): 50 ug via INTRAVENOUS
  Filled 2020-05-16 (×2): qty 2

## 2020-05-16 NOTE — H&P (Signed)
History and Physical  Melissa Deleon [id number] DOB: 1933-06-16 DOA: 05/16/2020  Referring physician: Aaron Edelman, MD PCP: Celene Squibb, MD  Patient coming from: Home  Chief Complaint: Fall  HPI: Melissa Deleon is a 87 y.o. female with medical history significant for history of breast cancer (2016-DCIS in situ), Alzheimer's disease and Urge incontinence who presents to the emergency department accompanied by daughter due to a fall sustained at home.  Patient was unable to provide a history due to dementia, history was obtained from daughter, ED physician and ED medical record.  Per report, patient lives alone, daughter has a video camera set up in the house to be able to check on the patient.  Patient went to the kitchen and while trying to make a turn, she slipped and landed on the right side of her face, ribs and hip, she was unable to get up, but she crawled to her cell phone, though she was unable to make a call due to her dementia.  Daughter lives in [place] and was coincidentally on her way to go see the patient, on arrival, she was seen sitting down on the floor without being in any significant distress, she has hematoma to right side of her forehead.  Patient denies loss of consciousness and she was not on any blood thinners.  She was taken to the ED for evaluation and management.  ED Course:  In the emergency department, she was hemodynamically stable.  Work-up in the ED showed normal CBC and CMP Right ribs and chest x-ray showed right seventh and eighth rib fractures without complicating factors. CT of head without contrast showed thin acute right frontal subdural hematoma measures 3 mm in thickness. No associated mass effect or midline shift. No skull fracture. 2. Right periorbital scalp hematoma. Right knee, right hip x-rays and CT cervical spine without contrast showed degenerative change without acute abnormality Neurosurgery was consulted and there was no indication for any surgical  intervention at this time, it was recommended for CT to be repeated 6 hours after initial CT of head per ED physician.  Hospitalist was asked to admit patient for further evaluation and management.  Review of Systems: This cannot be obtained at this time due to patient's dementia  Past Medical History:  Diagnosis Date  . Breast cancer ([place]) 05/2014  . Chronic renal disease, stage 3, moderately decreased glomerular filtration rate (GFR) between 30-59 mL/min/1.73 square meter (HCC) 08/13/2014  . Ductal carcinoma in situ (DCIS) of right breast 08/13/2014  . GERD (gastroesophageal reflux disease)   . Hypertension    Past Surgical History:  Procedure Laterality Date  . BACK SURGERY    . Bladder ring    . BREAST BIOPSY Right 06/10/2014   Procedure: RIGHT BREAST BIOPSY AFTER NEEDLE LOCALIZATION;  Surgeon: Jamesetta So, MD;  Location: [facility];  Service: General;  Laterality: Right;  . BREAST LUMPECTOMY Right 06/2014  . HYSTEROSCOPY WITH D & C N/A 06/02/2015   Procedure: DILATATION AND CURETTAGE /HYSTEROSCOPY;  Surgeon: Florian Buff, MD;  Location: [facility];  Service: Gynecology;  Laterality: N/A;  . KYPHOPLASTY N/A 06/11/2012   Procedure: KYPHOPLASTY (C-Arm x 2);  Surgeon: Otilio Connors, MD;  Location: [facility];  Service: Neurosurgery;  Laterality: N/A;  Lumbar two Kyphoplasty  . LUMBAR LAMINECTOMY/DECOMPRESSION MICRODISCECTOMY  03/07/2012   Procedure: LUMBAR LAMINECTOMY/DECOMPRESSION MICRODISCECTOMY 1 LEVEL;  Surgeon: Otilio Connors, MD;  Location: [facility] NEURO ORS;  Service: Neurosurgery;  Laterality: Left;  Left Lumbar four-five Laminectomy  for Synovial cyst  . NO PAST SURGERIES     RING PLACED TO HOLD BLADDER   . POLYPECTOMY N/A 06/02/2015   Procedure: Removal of endometrial Polyps;  Surgeon: Florian Buff, MD;  Location: [facility];  Service: Gynecology;  Laterality: N/A;    Social History:  reports that she has never smoked. She has never used smokeless tobacco. She reports that she does not drink  alcohol and does not use drugs.   Allergies  Allergen Reactions  . Sulfa Antibiotics Nausea Only    Family History  Problem Relation Age of Onset  . Diabetes Mother   . Heart attack Father   . Cancer Sister        breast     Prior to Admission medications   Medication Sig Start Date End Date Taking? Authorizing Provider  acetaminophen (TYLENOL) 500 MG tablet Take 500 mg by mouth every 6 (six) hours as needed.    [provider]  donepezil (ARICEPT) 5 MG tablet Take 5 mg by mouth at bedtime. 11/19/19   [provider]  tolterodine (DETROL LA) 4 MG 24 hr capsule Take 1 capsule (4 mg total) by mouth daily. At bedtime 02/17/20   Florian Buff, MD    Physical Exam: BP 135/71   Pulse 80   Temp 97.8 F (36.6 C) (Oral)   Resp 18   Ht 4\' 9"  (1.448 m)   Wt 50.8 kg   SpO2 98%   BMI 24.24 kg/m   . General: 87 y.o. year-old female pleasant and in no acute distress.  Alert and oriented x3. Marland Kitchen HEENT: Right periorbital and right forehead hematoma without tenderness, EOMI . Neck: Supple, trachea media . Cardiovascular: Regular rate and rhythm with no rubs or gallops.  No thyromegaly or JVD noted.  2/4 pulses in all 4 extremities. Marland Kitchen Respiratory: Clear to auscultation with no wheezes or rales. Good inspiratory effort. . Abdomen: Soft nontender nondistended with normal bowel sounds x4 quadrants. . Muskuloskeletal: Tender to palpation of right ribs. No cyanosis, clubbing or edema noted bilaterally . Neuro: Patient was able to follow commands, she has some memory loss, but was able to remember some of the events about her fall.   . Skin: No ulcerative lesions noted or rashes . Psychiatry: Mood is appropriate for condition and setting          Labs on Admission:  Basic Metabolic Panel: Recent Labs  Lab 05/16/20 1843  NA 137  K 4.2  CL 102  CO2 28  GLUCOSE 86  BUN 17  CREATININE 0.75  CALCIUM 9.5   Liver Function Tests: Recent Labs  Lab 05/16/20 1843  AST 26   ALT 19  ALKPHOS 87  BILITOT 0.4  PROT 7.0  ALBUMIN 3.8   No results for input(s): LIPASE, AMYLASE in the last 168 hours. No results for input(s): AMMONIA in the last 168 hours. CBC: Recent Labs  Lab 05/16/20 1843  WBC 7.7  HGB 13.2  HCT 42.9  MCV 93.7  PLT 255   Cardiac Enzymes: No results for input(s): CKTOTAL, CKMB, CKMBINDEX, TROPONINI in the last 168 hours.  BNP (last 3 results) No results for input(s): BNP in the last 8760 hours.  ProBNP (last 3 results) No results for input(s): PROBNP in the last 8760 hours.  CBG: No results for input(s): GLUCAP in the last 168 hours.  Radiological Exams on Admission: DG Ribs Unilateral W/Chest Right  Result Date: 05/16/2020 CLINICAL DATA:  Recent fall with right-sided chest  pain, initial encounter EXAM: RIGHT RIBS AND CHEST - 3+ VIEW COMPARISON:  None. FINDINGS: Mildly displaced fractures of the right seventh and eighth ribs are noted. Old rib fractures are noted with healing anteriorly. No pneumothorax is seen. The lungs are clear. Large hiatal hernia is noted. IMPRESSION: Right seventh and eighth rib fractures without complicating factors. Electronically Signed   By: Inez Catalina M.D.   On: 05/16/2020 19:06   CT Head Wo Contrast  Result Date: 05/16/2020 CLINICAL DATA:  Fall at home this morning with right forehead bruising. EXAM: CT HEAD WITHOUT CONTRAST TECHNIQUE: Contiguous axial images were obtained from the base of the skull through the vertex without intravenous contrast. COMPARISON:  None. FINDINGS: Brain: Thin acute right frontal subdural hematoma measures 3 mm in thickness. No associated mass effect. No midline shift. No hemorrhage elsewhere. Normal for age atrophy and chronic small vessel ischemia. No evidence of acute infarct. No hydrocephalus. Basilar cisterns are patent. Vascular: Atherosclerosis of skullbase vasculature without hyperdense vessel or abnormal calcification. Skull: No fracture or focal lesion. Sinuses/Orbits:  No acute findings. Mild mucosal thickening of right maxillary sinus. Mastoid air cells are clear. No acute fracture. Other: Right periorbital scalp hematoma. IMPRESSION: 1. Thin acute right frontal subdural hematoma measures 3 mm in thickness. No associated mass effect or midline shift. No skull fracture. 2. Right periorbital scalp hematoma. 3. Normal for age atrophy and chronic small vessel ischemia. Critical Value/emergent results were called by telephone at the time of interpretation on 05/16/2020 at 6:26 pm to provider [facility] , who verbally acknowledged these results. Electronically Signed   By: Keith Rake M.D.   On: 05/16/2020 18:27   CT Cervical Spine Wo Contrast  Result Date: 05/16/2020 CLINICAL DATA:  Fall at home with facial bruising. EXAM: CT CERVICAL SPINE WITHOUT CONTRAST TECHNIQUE: Multidetector CT imaging of the cervical spine was performed without intravenous contrast. Multiplanar CT image reconstructions were also generated. COMPARISON:  None. FINDINGS: Alignment: Exaggerated cervical lordosis.  No traumatic subluxation. Skull base and vertebrae: No acute fracture. Vertebral body heights are maintained. The dens and skull base are intact. Prominent degenerative changes C1-C2. Soft tissues and spinal canal: No prevertebral fluid or swelling. No visible canal hematoma. Disc levels: Multilevel endplate spurring with preservation of disc spaces. Upper chest: No acute or unexpected findings. Other: None. IMPRESSION: Degenerative change in the cervical spine without acute fracture or subluxation. Electronically Signed   By: Keith Rake M.D.   On: 05/16/2020 18:29   DG Knee Complete 4 Views Right  Result Date: 05/16/2020 CLINICAL DATA:  Recent fall with right knee pain, initial encounter EXAM: RIGHT KNEE - COMPLETE 4+ VIEW COMPARISON:  01/22/2017 FINDINGS: Tricompartmental degenerative changes are noted without acute fracture or dislocation. No joint effusion is seen. No soft tissue  abnormality is noted. IMPRESSION: Degenerative change without acute abnormality. Electronically Signed   By: Inez Catalina M.D.   On: 05/16/2020 19:08   DG Hip Unilat W or Wo Pelvis 2-3 Views Right  Result Date: 05/16/2020 CLINICAL DATA:  Recent fall with right hip pain, initial encounter EXAM: DG HIP (WITH OR WITHOUT PELVIS) 2-3V RIGHT COMPARISON:  None. FINDINGS: Pelvic ring is intact. Mild degenerative changes of the hip joints are noted bilaterally. No acute fracture or dislocation is noted. No soft tissue abnormality is seen. Degenerative changes of lumbar spine are noted. IMPRESSION: Degenerative changes without acute abnormality. Electronically Signed   By: Inez Catalina M.D.   On: 05/16/2020 19:07    EKG: I independently  viewed the EKG done and my findings are as followed: Normal sinus rhythm at rate of 96 bpm  Assessment/Plan Present on Admission: . Subdural hematoma ([place]) . Urge incontinence  Principal Problem:   Subdural hematoma (HCC) Active Problems:   Urge incontinence   Accidental fall   Alzheimer's dementia ([place])  Subdural hematoma secondary to accidental fall CT of head without contrast showed subdural hematoma.  Neurosurgery was consulted and recommended repeat CT 6 hours after initial CT of head and that there was no indication for any surgical intervention at this time per ED physician Repeat CT head without contrast will be done Continue IV fentanyl as needed Continue fall precaution and neurochecks Continue PT/OT eval and treat It may not be safe to discharge patient home at this time considering that she lives alone and with history of Alzheimer's dementia which affects some of her ADLs, she may benefit from SNF/placement based on PT/OT recommendations  Urge incontinence Continue Tovias  Alzheimer's dementia Aricept   DVT prophylaxis: SCDs  Code Status: Full code  Family Communication: Daughter at bedside (all questions answered to  satisfaction)  Disposition Plan:  Patient is from:                        home Anticipated DC to:                   SNF or family members home Anticipated DC date:               2-3 days Anticipated DC barriers:          Patient is unstable to be discharged home due to accidental fall resulting in subdural hematoma  Consults called: None  Admission status: Inpatient  Bernadette Hoit MD [facility]  05/16/2020, 9:44 PM

## 2020-05-16 NOTE — ED Triage Notes (Addendum)
Pt had a fall at home this morning and she has pain with obvious bruising to the right side of her forehead.  Pt also has pain in right ribs, hip, and knee.  Pt denies LOC and does not take blood thinners.

## 2020-05-16 NOTE — ED Notes (Signed)
Pt returned from CT Scan 

## 2020-05-16 NOTE — ED Provider Notes (Signed)
[facility] Provider Note   CSN: [id number] Arrival date & time: 05/16/20  1628     History Chief Complaint  Patient presents with  . Fall    Melissa Deleon is a 87 y.o. female.  HPI   This patient is an 87 year old female, she has a history of breast cancer in the past, she has stage III chronic kidney disease, she also has acid reflux and hypertension.  Her family lives in [place].  They were coming to the house today to visit with her when they found her on the ground.  They have a video camera set up in the house and were able to witness the fall that the patient had.  She was in the kitchen when she slipped, she landed on the right side of her face ribs and hip.  She was unable to get up to the standing position from the ground but was able to crawl approximately 30 feet towards where a phone was.  Because of her mild dementia she was unable to use the phone, they found her in no other significant distress, there is no active bleeding but she did have a hematoma on the right side of her forehead.  She does have some dementia, she is still able to live by herself but has difficulty with activities of daily living secondary to the dementia.  Level 5 caveat applies secondary to the patient's dementia  Past Medical History:  Diagnosis Date  . Breast cancer ([place]) 05/2014  . Chronic renal disease, stage 3, moderately decreased glomerular filtration rate (GFR) between 30-59 mL/min/1.73 square meter (HCC) 08/13/2014  . Ductal carcinoma in situ (DCIS) of right breast 08/13/2014  . GERD (gastroesophageal reflux disease)   . Hypertension     Patient Active Problem List   Diagnosis Date Noted  . Subdural hematoma ([place]) 05/16/2020  . Ductal carcinoma in situ (DCIS) of right breast 08/13/2014  . Chronic renal disease, stage 3, moderately decreased glomerular filtration rate (GFR) between 30-59 mL/min/1.73 square meter (HCC) 08/13/2014  . Urge incontinence 10/08/2012  .  Uterovaginal prolapse, complete 10/08/2012  . HYPERTENSION, UNSPECIFIED 10/22/2008  . ARTHRITIS, CHRONIC 10/22/2008    Past Surgical History:  Procedure Laterality Date  . BACK SURGERY    . Bladder ring    . BREAST BIOPSY Right 06/10/2014   Procedure: RIGHT BREAST BIOPSY AFTER NEEDLE LOCALIZATION;  Surgeon: Jamesetta So, MD;  Location: [facility];  Service: General;  Laterality: Right;  . BREAST LUMPECTOMY Right 06/2014  . HYSTEROSCOPY WITH D & C N/A 06/02/2015   Procedure: DILATATION AND CURETTAGE /HYSTEROSCOPY;  Surgeon: Florian Buff, MD;  Location: [facility];  Service: Gynecology;  Laterality: N/A;  . KYPHOPLASTY N/A 06/11/2012   Procedure: KYPHOPLASTY (C-Arm x 2);  Surgeon: Otilio Connors, MD;  Location: [facility] NEURO ORS;  Service: Neurosurgery;  Laterality: N/A;  Lumbar two Kyphoplasty  . LUMBAR LAMINECTOMY/DECOMPRESSION MICRODISCECTOMY  03/07/2012   Procedure: LUMBAR LAMINECTOMY/DECOMPRESSION MICRODISCECTOMY 1 LEVEL;  Surgeon: Otilio Connors, MD;  Location: [facility] NEURO ORS;  Service: Neurosurgery;  Laterality: Left;  Left Lumbar four-five Laminectomy for Synovial cyst  . NO PAST SURGERIES     RING PLACED TO HOLD BLADDER   . POLYPECTOMY N/A 06/02/2015   Procedure: Removal of endometrial Polyps;  Surgeon: Florian Buff, MD;  Location: [facility];  Service: Gynecology;  Laterality: N/A;     OB History    Gravida  1   Para  1   Term  1   Preterm      AB      Living  1     SAB      IAB      Ectopic      Multiple      Live Births  1           Family History  Problem Relation Age of Onset  . Diabetes Mother   . Heart attack Father   . Cancer Sister        breast    Social History   Tobacco Use  . Smoking status: Never Smoker  . Smokeless tobacco: Never Used  Vaping Use  . Vaping Use: Never used  Substance Use Topics  . Alcohol use: No  . Drug use: No    Home Medications Prior to Admission medications   Medication Sig Start Date End Date Taking? Authorizing  Provider  acetaminophen (TYLENOL) 500 MG tablet Take 500 mg by mouth every 6 (six) hours as needed.    [provider]  donepezil (ARICEPT) 5 MG tablet Take 5 mg by mouth at bedtime. 11/19/19   [provider]  tolterodine (DETROL LA) 4 MG 24 hr capsule Take 1 capsule (4 mg total) by mouth daily. At bedtime 02/17/20   Florian Buff, MD    Allergies    Sulfa antibiotics  Review of Systems   Review of Systems  All other systems reviewed and are negative.   Physical Exam Updated Vital Signs BP 135/71   Pulse 80   Temp 97.8 F (36.6 C) (Oral)   Resp 18   Ht 1.448 m (4\' 9" )   Wt 50.8 kg   SpO2 98%   BMI 24.24 kg/m   Physical Exam Vitals and nursing note reviewed.  Constitutional:      General: She is not in acute distress.    Appearance: She is well-developed and well-nourished.  HENT:     Head: Normocephalic.     Comments: Hematoma to the right forehead and right periorbital area, no tenderness    Mouth/Throat:     Mouth: Oropharynx is clear and moist.     Pharynx: No oropharyngeal exudate.  Eyes:     General: No scleral icterus.       Right eye: No discharge.        Left eye: No discharge.     Extraocular Movements: EOM normal.     Conjunctiva/sclera: Conjunctivae normal.     Pupils: Pupils are equal, round, and reactive to light.  Neck:     Thyroid: No thyromegaly.     Vascular: No JVD.  Cardiovascular:     Rate and Rhythm: Normal rate and regular rhythm.     Pulses: Intact distal pulses.     Heart sounds: Normal heart sounds. No murmur heard. No friction rub. No gallop.   Pulmonary:     Effort: Pulmonary effort is normal. No respiratory distress.     Breath sounds: Normal breath sounds. No wheezing or rales.  Abdominal:     General: Bowel sounds are normal. There is no distension.     Palpations: Abdomen is soft. There is no mass.     Tenderness: There is no abdominal tenderness.  Musculoskeletal:        General: No tenderness or edema.  Normal range of motion.     Cervical back: Normal range of motion and neck supple.     Comments: No tenderness over the cervical spine, able to move  all 4 extremities with normal range of motion, she does have some pain in her right knee with range of motion though no deformity, tenderness over the right ribs without crepitance or subcutaneous emphysema  Lymphadenopathy:     Cervical: No cervical adenopathy.  Skin:    General: Skin is warm and dry.     Findings: No erythema or rash.  Neurological:     Mental Status: She is alert.     Coordination: Coordination normal.     Comments: The patient is awake alert and able to follow commands, she has some memory loss but is able to remember most of what happened this morning.  She knows her daughter who is at the bedside  Psychiatric:        Mood and Affect: Mood and affect normal.        Behavior: Behavior normal.     ED Results / Procedures / Treatments   Labs (all labs ordered are listed, but only abnormal results are displayed) Labs Reviewed  SARS CORONAVIRUS 2 BY RT PCR [facility] ORDER, [place] LAB)  CBC  COMPREHENSIVE METABOLIC PANEL    EKG EKG Interpretation  Date/Time:  Sunday May 16 2020 17:48:05 EST Ventricular Rate:  96 PR Interval:    QRS Duration: 94 QT Interval:  344 QTC Calculation: 435 R Axis:   -4 Text Interpretation: Sinus rhythm Borderline low voltage, extremity leads Confirmed by Noemi Chapel [phone]) on 05/16/2020 5:52:12 PM   Radiology DG Ribs Unilateral W/Chest Right  Result Date: 05/16/2020 CLINICAL DATA:  Recent fall with right-sided chest pain, initial encounter EXAM: RIGHT RIBS AND CHEST - 3+ VIEW COMPARISON:  None. FINDINGS: Mildly displaced fractures of the right seventh and eighth ribs are noted. Old rib fractures are noted with healing anteriorly. No pneumothorax is seen. The lungs are clear. Large hiatal hernia is noted. IMPRESSION: Right seventh and eighth rib  fractures without complicating factors. Electronically Signed   By: Inez Catalina M.D.   On: 05/16/2020 19:06   CT Head Wo Contrast  Result Date: 05/16/2020 CLINICAL DATA:  Fall at home this morning with right forehead bruising. EXAM: CT HEAD WITHOUT CONTRAST TECHNIQUE: Contiguous axial images were obtained from the base of the skull through the vertex without intravenous contrast. COMPARISON:  None. FINDINGS: Brain: Thin acute right frontal subdural hematoma measures 3 mm in thickness. No associated mass effect. No midline shift. No hemorrhage elsewhere. Normal for age atrophy and chronic small vessel ischemia. No evidence of acute infarct. No hydrocephalus. Basilar cisterns are patent. Vascular: Atherosclerosis of skullbase vasculature without hyperdense vessel or abnormal calcification. Skull: No fracture or focal lesion. Sinuses/Orbits: No acute findings. Mild mucosal thickening of right maxillary sinus. Mastoid air cells are clear. No acute fracture. Other: Right periorbital scalp hematoma. IMPRESSION: 1. Thin acute right frontal subdural hematoma measures 3 mm in thickness. No associated mass effect or midline shift. No skull fracture. 2. Right periorbital scalp hematoma. 3. Normal for age atrophy and chronic small vessel ischemia. Critical Value/emergent results were called by telephone at the time of interpretation on 05/16/2020 at 6:26 pm to provider [facility] , who verbally acknowledged these results. Electronically Signed   By: Keith Rake M.D.   On: 05/16/2020 18:27   CT Cervical Spine Wo Contrast  Result Date: 05/16/2020 CLINICAL DATA:  Fall at home with facial bruising. EXAM: CT CERVICAL SPINE WITHOUT CONTRAST TECHNIQUE: Multidetector CT imaging of the cervical spine was performed without intravenous contrast. Multiplanar CT image reconstructions  were also generated. COMPARISON:  None. FINDINGS: Alignment: Exaggerated cervical lordosis.  No traumatic subluxation. Skull base and vertebrae:  No acute fracture. Vertebral body heights are maintained. The dens and skull base are intact. Prominent degenerative changes C1-C2. Soft tissues and spinal canal: No prevertebral fluid or swelling. No visible canal hematoma. Disc levels: Multilevel endplate spurring with preservation of disc spaces. Upper chest: No acute or unexpected findings. Other: None. IMPRESSION: Degenerative change in the cervical spine without acute fracture or subluxation. Electronically Signed   By: Keith Rake M.D.   On: 05/16/2020 18:29   DG Knee Complete 4 Views Right  Result Date: 05/16/2020 CLINICAL DATA:  Recent fall with right knee pain, initial encounter EXAM: RIGHT KNEE - COMPLETE 4+ VIEW COMPARISON:  01/22/2017 FINDINGS: Tricompartmental degenerative changes are noted without acute fracture or dislocation. No joint effusion is seen. No soft tissue abnormality is noted. IMPRESSION: Degenerative change without acute abnormality. Electronically Signed   By: Inez Catalina M.D.   On: 05/16/2020 19:08   DG Hip Unilat W or Wo Pelvis 2-3 Views Right  Result Date: 05/16/2020 CLINICAL DATA:  Recent fall with right hip pain, initial encounter EXAM: DG HIP (WITH OR WITHOUT PELVIS) 2-3V RIGHT COMPARISON:  None. FINDINGS: Pelvic ring is intact. Mild degenerative changes of the hip joints are noted bilaterally. No acute fracture or dislocation is noted. No soft tissue abnormality is seen. Degenerative changes of lumbar spine are noted. IMPRESSION: Degenerative changes without acute abnormality. Electronically Signed   By: Inez Catalina M.D.   On: 05/16/2020 19:07    Procedures .Critical Care Performed by: Noemi Chapel, MD Authorized by: Noemi Chapel, MD   Critical care provider statement:    Critical care time (minutes):  35   Critical care time was exclusive of:  Separately billable procedures and treating other patients and teaching time   Critical care was necessary to treat or prevent imminent or life-threatening  deterioration of the following conditions:  CNS failure or compromise and trauma   Critical care was time spent personally by me on the following activities:  Blood draw for specimens, development of treatment plan with patient or surrogate, discussions with consultants, evaluation of patient's response to treatment, examination of patient, obtaining history from patient or surrogate, ordering and performing treatments and interventions, ordering and review of laboratory studies, ordering and review of radiographic studies, pulse oximetry, re-evaluation of patient's condition and review of old charts     Medications Ordered in ED Medications  fentaNYL (SUBLIMAZE) injection 50 mcg (has no administration in time range)    ED Course  I have reviewed the triage vital signs and the nursing notes.  Pertinent labs & imaging results that were available during my care of the patient were reviewed by me and considered in my medical decision making (see chart for details).    MDM Rules/Calculators/A&P                          1.  Fall, rule out significant trauma 2.  Patient has dementia, will need some home health services 3.  The daughter is already looked into assisted living but they do not feel like they can afford it, case management will need to be consulted to contact him as an outpatient 4.  The patient does not appear severely ill, she has a normal mental status and vital signs are reassuring.  Unfortunately the patient does have what appears to be a subdural hematoma, additionally  there are 2 rib fractures.  I discussed the case with the neurosurgeon, they recommend a repeat CT scan in 6 hours.  The rib fractures can be treated supportive care, NSAID is prominent ordered, pain medication ordered, discussed with the hospitalist will admit.  Final Clinical Impression(s) / ED Diagnoses Final diagnoses:  Subdural hematoma ([place])  Closed fracture of multiple ribs of right side, initial  encounter      Noemi Chapel, MD 05/16/20 2022

## 2020-05-16 NOTE — ED Notes (Signed)
Patient transported to CT 

## 2020-05-17 DIAGNOSIS — G9341 Metabolic encephalopathy: Secondary | ICD-10-CM | POA: Diagnosis present

## 2020-05-17 DIAGNOSIS — I629 Nontraumatic intracranial hemorrhage, unspecified: Secondary | ICD-10-CM

## 2020-05-17 DIAGNOSIS — N390 Urinary tract infection, site not specified: Secondary | ICD-10-CM | POA: Diagnosis present

## 2020-05-17 DIAGNOSIS — S0003XA Contusion of scalp, initial encounter: Secondary | ICD-10-CM | POA: Diagnosis present

## 2020-05-17 DIAGNOSIS — S2249XA Multiple fractures of ribs, unspecified side, initial encounter for closed fracture: Secondary | ICD-10-CM | POA: Diagnosis present

## 2020-05-17 DIAGNOSIS — I10 Essential (primary) hypertension: Secondary | ICD-10-CM | POA: Insufficient documentation

## 2020-05-17 LAB — CBC
HCT: 46.7 % — ABNORMAL HIGH (ref 36.0–46.0)
Hemoglobin: 14.8 g/dL (ref 12.0–15.0)
MCH: 29.4 pg (ref 26.0–34.0)
MCHC: 31.7 g/dL (ref 30.0–36.0)
MCV: 92.8 fL (ref 80.0–100.0)
Platelets: 350 10*3/uL (ref 150–400)
RBC: 5.03 MIL/uL (ref 3.87–5.11)
RDW: 12.5 % (ref 11.5–15.5)
WBC: 6.7 10*3/uL (ref 4.0–10.5)
nRBC: 0 % (ref 0.0–0.2)

## 2020-05-17 LAB — COMPREHENSIVE METABOLIC PANEL
ALT: 19 U/L (ref 0–44)
AST: 24 U/L (ref 15–41)
Albumin: 4.1 g/dL (ref 3.5–5.0)
Alkaline Phosphatase: 88 U/L (ref 38–126)
Anion gap: 9 (ref 5–15)
BUN: 12 mg/dL (ref 8–23)
CO2: 28 mmol/L (ref 22–32)
Calcium: 9.8 mg/dL (ref 8.9–10.3)
Chloride: 100 mmol/L (ref 98–111)
Creatinine, Ser: 0.71 mg/dL (ref 0.44–1.00)
GFR, Estimated: >60 mL/min (ref >60)
Glucose, Bld: 110 mg/dL — ABNORMAL HIGH (ref 70–99)
Potassium: 3.2 mmol/L — ABNORMAL LOW (ref 3.5–5.1)
Sodium: 137 mmol/L (ref 135–145)
Total Bilirubin: 0.7 mg/dL (ref 0.3–1.2)
Total Protein: 7.4 g/dL (ref 6.5–8.1)

## 2020-05-17 LAB — PROTIME-INR
INR: 1 (ref 0.8–1.2)
Prothrombin Time: 12.3 seconds (ref 11.4–15.2)

## 2020-05-17 LAB — URINALYSIS, ROUTINE W REFLEX MICROSCOPIC
Bilirubin Urine: NEGATIVE
Glucose, UA: NEGATIVE mg/dL
Ketones, ur: NEGATIVE mg/dL
Nitrite: NEGATIVE
Protein, ur: 30 mg/dL — AB
RBC / HPF: >50 RBC/hpf — ABNORMAL HIGH (ref 0–5)
Specific Gravity, Urine: 1.008 (ref 1.005–1.030)
pH: 7 (ref 5.0–8.0)

## 2020-05-17 LAB — MAGNESIUM: Magnesium: 2.1 mg/dL (ref 1.7–2.4)

## 2020-05-17 LAB — APTT: aPTT: 28 seconds (ref 24–36)

## 2020-05-17 LAB — PHOSPHORUS: Phosphorus: 2 mg/dL — ABNORMAL LOW (ref 2.5–4.6)

## 2020-05-17 LAB — SARS CORONAVIRUS 2 BY RT PCR (HOSPITAL ORDER, PERFORMED IN ~~LOC~~ HOSPITAL LAB): SARS Coronavirus 2: NEGATIVE

## 2020-05-17 MED ORDER — TRAZODONE HCL 50 MG PO TABS: 50.0000 mg | ORAL_TABLET | Freq: Every day | ORAL | 3 refills | Status: AC

## 2020-05-17 MED ORDER — CEPHALEXIN 250 MG PO CAPS: 250.0000 mg | ORAL_CAPSULE | Freq: Three times a day (TID) | ORAL | 0 refills | Status: AC

## 2020-05-17 MED ORDER — SODIUM CHLORIDE 0.9 % IV SOLN
1.0000 g | Freq: Once | INTRAVENOUS | Status: AC
  Administered 2020-05-17: 1 g via INTRAVENOUS
  Filled 2020-05-17: qty 10

## 2020-05-17 MED ORDER — POTASSIUM CHLORIDE CRYS ER 20 MEQ PO TBCR
40.0000 meq | EXTENDED_RELEASE_TABLET | Freq: Once | ORAL | Status: AC
  Administered 2020-05-17: 40 meq via ORAL
  Filled 2020-05-17: qty 2

## 2020-05-17 NOTE — Evaluation (Signed)
Physical Therapy Evaluation Patient Details Name: Melissa Deleon MRN: [id number] DOB: 1933-12-24 Today's Date: 05/17/2020   History of Present Illness  Melissa Deleon is a 87 y.o. female with medical history significant for history of breast cancer (2016-DCIS in situ), Alzheimer's disease and Urge incontinence who presents to the emergency department accompanied by daughter due to a fall sustained at home.  Patient was unable to provide a history due to dementia, history was obtained from daughter, ED physician and ED medical record.  Per report, patient lives alone, daughter has a video camera set up in the house to be able to check on the patient.  Patient went to the kitchen and while trying to make a turn, she slipped and landed on the right side of her face, ribs and hip, she was unable to get up, but she crawled to her cell phone, though she was unable to make a call due to her dementia.  Daughter lives in [place] and was coincidentally on her way to go see the patient, on arrival, she was seen sitting down on the floor without being in any significant distress, she has hematoma to right side of her forehead.  Patient denies loss of consciousness and she was not on any blood thinners.  She was taken to the ED for evaluation and management.    Clinical Impression  Patient functioning near baseline for functional mobility and gait demonstrating slightly labored cadence without loss of balance during ambulation in room and hallway using RW.  Patient put back to bed after therapy.  Patient will benefit from continued physical therapy in hospital and recommended venue below to increase strength, balance, endurance for safe ADLs and gait.     Follow Up Recommendations Home health PT;Supervision - Intermittent;Supervision for mobility/OOB    Equipment Recommendations  None recommended by PT    Recommendations for Other Services       Precautions / Restrictions Precautions Precautions:  Fall Restrictions Weight Bearing Restrictions: No      Mobility  Bed Mobility Overal bed mobility: Needs Assistance Bed Mobility: Supine to Sit;Sit to Supine     Supine to sit: Supervision Sit to supine: Supervision   General bed mobility comments: increased time, slightly labored movement    Transfers Overall transfer level: Needs assistance Equipment used: Rolling walker (2 wheeled) Transfers: Sit to/from [facility] Sit to Stand: Supervision;Min guard Stand pivot transfers: Supervision;Min guard       General transfer comment: increased time, labored movement  Ambulation/Gait Ambulation/Gait assistance: Supervision Gait Distance (Feet): 100 Feet Assistive device: Rolling walker (2 wheeled) Gait Pattern/deviations: Decreased step length - right;Decreased step length - left;Decreased stride length Gait velocity: decreased   General Gait Details: slightly labored cadence without loss of balance, limited secondary to fatigue  Stairs            Wheelchair Mobility    Modified Rankin (Stroke Patients Only)       Balance Overall balance assessment: Needs assistance Sitting-balance support: Feet supported;No upper extremity supported Sitting balance-Leahy Scale: Good Sitting balance - Comments: seated at EOB   Standing balance support: During functional activity;Bilateral upper extremity supported Standing balance-Leahy Scale: Fair Standing balance comment: using RW                             Pertinent Vitals/Pain Pain Assessment: No/denies pain    Home Living Family/patient expects to be discharged to:: Private residence Living Arrangements: Alone Available  Help at Discharge: Family;Available PRN/intermittently Type of Home: House Home Access: Stairs to enter Entrance Stairs-Rails: Right;Left (to wide to reach both) Entrance Stairs-Number of Steps: 4-5 Home Layout: One level Home Equipment: Walker - 2 wheels;Shower  seat      Prior Function Level of Independence: Needs assistance   Gait / Transfers Assistance Needed: household ambulator using RW  ADL's / Homemaking Assistance Needed: assisted by family        Hand Dominance        Extremity/Trunk Assessment   Upper Extremity Assessment Upper Extremity Assessment: Defer to OT evaluation    Lower Extremity Assessment Lower Extremity Assessment: Generalized weakness    Cervical / Trunk Assessment Cervical / Trunk Assessment: Normal  Communication   Communication: Expressive difficulties (sometimes difficult to understand)  Cognition Arousal/Alertness: Awake/alert Behavior During Therapy: WFL for tasks assessed/performed Overall Cognitive Status: History of cognitive impairments - at baseline                                        General Comments      Exercises     Assessment/Plan    PT Assessment Patient needs continued PT services  PT Problem List Decreased strength;Decreased activity tolerance;Decreased balance;Decreased mobility       PT Treatment Interventions DME instruction;Gait training;Stair training;Functional mobility training;Therapeutic activities;Therapeutic exercise;Balance training;Patient/family education    PT Goals (Current goals can be found in the Care Plan section)  Acute Rehab PT Goals Patient Stated Goal: return home with family to assist PT Goal Formulation: With patient Time For Goal Achievement: 05/21/20 Potential to Achieve Goals: Good    Frequency Min 3X/week   Barriers to discharge        Co-evaluation               AM-PAC PT "6 Clicks" Mobility  Outcome Measure Help needed turning from your back to your side while in a flat bed without using bedrails?: None Help needed moving from lying on your back to sitting on the side of a flat bed without using bedrails?: None Help needed moving to and from a bed to a chair (including a wheelchair)?: A Little Help needed  standing up from a chair using your arms (e.g., wheelchair or bedside chair)?: A Little Help needed to walk in hospital room?: A Little Help needed climbing 3-5 steps with a railing? : A Little 6 Click Score: 20    End of Session         PT Visit Diagnosis: Unsteadiness on feet (R26.81);Other abnormalities of gait and mobility (R26.89);Muscle weakness (generalized) (M62.81)    Time: 4818-5631 PT Time Calculation (min) (ACUTE ONLY): 23 min   Charges:   PT Evaluation $PT Eval Moderate Complexity: 1 Mod PT Treatments $Therapeutic Activity: 23-37 mins        1:54 PM, 05/17/20 Lonell Grandchild, MPT Physical Therapist with [facility] [phone] office [phone] mobile phone

## 2020-05-17 NOTE — Discharge Instructions (Signed)
1) please take medications as prescribed  2) please call [phone] on Wednesday, 05/19/2020 to get results of the urine culture

## 2020-05-17 NOTE — Plan of Care (Signed)
  Problem: Acute Rehab PT Goals(only PT should resolve) Goal: Pt Will Go Supine/Side To Sit Outcome: Progressing Flowsheets (Taken 05/17/2020 1355) Pt will go Supine/Side to Sit: with modified independence Goal: Patient Will Transfer Sit To/From Stand Outcome: Progressing Flowsheets (Taken 05/17/2020 1355) Patient will transfer sit to/from stand:  with supervision  with modified independence Goal: Pt Will Transfer Bed To Chair/Chair To Bed Outcome: Progressing Flowsheets (Taken 05/17/2020 1355) Pt will Transfer Bed to Chair/Chair to Bed:  with modified independence  with supervision Goal: Pt Will Ambulate Outcome: Progressing Flowsheets (Taken 05/17/2020 1355) Pt will Ambulate:  > 125 feet  with modified independence  with supervision  with rolling walker   1:56 PM, 05/17/20 Lonell Grandchild, MPT Physical Therapist with [facility] [phone] office [phone] mobile phone

## 2020-05-17 NOTE — TOC Transition Note (Signed)
Transition of Care [facility]) - CM/SW Discharge Note   Patient Details  Name: Melissa Deleon MRN: [id number] Date of Birth: January 04, 1934  Transition of Care [facility]) CM/SW Contact:  [name], RN Phone Number: 05/17/2020, 3:18 PM   Clinical Narrative:   Patient medically ready for discharge. PT is recommending HHPT. Daughter- [name] is agreeable. Patient lives alone. Daughter has set up meals on wheels and had conversation with [facility]. Right now she does not intend to go with [facility], but look for other options. She has also visited 6 ALF's. She is requesting HHRN as well. [name] with [facility] accepted the referral. MD to place orders.    Final next level of care: [place] Barriers to Discharge: Barriers Resolved   Patient Goals and CMS Choice Patient states their goals for this hospitalization and ongoing recovery are:: to go home. CMS Medicare.gov Compare Post Acute Care list provided to:: Patient Represenative (must comment) Choice offered to / list presented to : Adult Children  Discharge Placement             Patient chooses bed at:  (Home) Patient to be transferred to facility by: [name] - daughter Name of family member notified: [name] Patient and family notified of of transfer: 05/17/20  Discharge Plan and Services      HH Arranged: RN,PT [facility] Agency: [place] ([place]) Date [place]: 05/17/20 Time [place]: [place] Representative spoke with at [place]: [name]   Readmission Risk Interventions Readmission Risk Prevention Plan 05/17/2020  Medication Screening Complete  Transportation Screening Complete  Some recent data might be hidden

## 2020-05-17 NOTE — ED Notes (Signed)
Entered the room and patient had removed all EKG lead, pulse oximeter, and BP cuff. Pt had also removed IV from right wrist/forearm. Bleeding was controlled.

## 2020-05-17 NOTE — Care Management Obs Status (Signed)
[place] NOTIFICATION   Patient Details  Name: Melissa Deleon MRN: [id number] Date of Birth: [birth year]   Medicare Observation Status Notification Given:  Yes    [name], RN 05/17/2020, 3:48 PM

## 2020-05-17 NOTE — ED Notes (Signed)
Pt noted to be crawling out of bed. Pt micturated on self and floor while RN was assisting Pt back to bed and into a supine position at 30 degrees. Pt still very confused at this time. Pt noted to have removed IV. This RN re-established IV access at this time. Pt noted to be complaining of being uncomfortable and in pain.

## 2020-05-17 NOTE — Care Management CC44 (Signed)
Condition Code 44 Documentation Completed  Patient Details  Name: Melissa Deleon MRN: [id number] Date of Birth: [birth year]   Condition Code 44 given:  Yes Patient signature on Condition Code 44 notice:  Yes Documentation of 2 MD's agreement:  Yes Code 44 added to claim:  Yes    [name], RN 05/17/2020, 3:48 PM

## 2020-05-17 NOTE — Discharge Summary (Addendum)
Melissa Deleon, is a 87 y.o. female  DOB 1934-02-17  MRN [id number].  Admission date:  05/16/2020  Admitting Physician  Roxan Hockey, MD  Discharge Date:  05/17/2020   Primary MD  Celene Squibb, MD  Recommendations for primary care physician for things to follow:    1) please take medications as prescribed  2) please call [phone] on Wednesday, 05/19/2020 to get results of the urine culture  Admission Diagnosis  Subdural hematoma ([place]) 99991111 Acute metabolic encephalopathy [id number]   Discharge Diagnosis  Subdural hematoma ([place]) 99991111 Acute metabolic encephalopathy [id number]    Principal Problem:   Acute metabolic encephalopathy Active Problems:   Accidental fall   Alzheimer's dementia ([place])   Scalp hematoma, initial encounter/Right frontal and supraorbital scalp swelling and hematoma   Intracranial hemorrhage/ tiny thin right frontal subdural hematoma that measures 20mm   Acute lower UTI   Rib fractures   Essential hypertension   Urge incontinence      Past Medical History:  Diagnosis Date  . Breast cancer ([place]) 05/2014  . Chronic renal disease, stage 3, moderately decreased glomerular filtration rate (GFR) between 30-59 mL/min/1.73 square meter (HCC) 08/13/2014  . Ductal carcinoma in situ (DCIS) of right breast 08/13/2014  . GERD (gastroesophageal reflux disease)   . Hypertension     Past Surgical History:  Procedure Laterality Date  . BACK SURGERY    . Bladder ring    . BREAST BIOPSY Right 06/10/2014   Procedure: RIGHT BREAST BIOPSY AFTER NEEDLE LOCALIZATION;  Surgeon: Jamesetta So, MD;  Location: [facility];  Service: General;  Laterality: Right;  . BREAST LUMPECTOMY Right 06/2014  . HYSTEROSCOPY WITH D & C N/A 06/02/2015   Procedure: DILATATION AND CURETTAGE /HYSTEROSCOPY;  Surgeon: Florian Buff, MD;  Location: [facility];  Service: Gynecology;  Laterality: N/A;  . KYPHOPLASTY  N/A 06/11/2012   Procedure: KYPHOPLASTY (C-Arm x 2);  Surgeon: Otilio Connors, MD;  Location[facility] NEURO ORS;  Service: Neurosurgery;  Laterality: N/A;  Lumbar two Kyphoplasty  . LUMBAR LAMINECTOMY/DECOMPRESSION MICRODISCECTOMY  03/07/2012   Procedure: LUMBAR LAMINECTOMY/DECOMPRESSION MICRODISCECTOMY 1 LEVEL;  Surgeon: Otilio Connors, MD;  Location: [facility] NEURO ORS;  Service: Neurosurgery;  Laterality: Left;  Left Lumbar four-five Laminectomy for Synovial cyst  . NO PAST SURGERIES     RING PLACED TO HOLD BLADDER   . POLYPECTOMY N/A 06/02/2015   Procedure: Removal of endometrial Polyps;  Surgeon: Florian Buff, MD;  Location: [facility];  Service: Gynecology;  Laterality: N/A;       HPI  from the history and physical done on the day of admission:    Patient coming from: Home  Chief Complaint: Fall  HPI: Melissa Deleon is a 87 y.o. female with medical history significant for history of breast cancer (2016-DCIS in situ), Alzheimer's disease and Urge incontinence who presents to the emergency department accompanied by daughter due to a fall sustained at home.  Patient was unable to provide a history due to dementia, history was obtained from  daughter, ED physician and ED medical record.  Per report, patient lives alone, daughter has a video camera set up in the house to be able to check on the patient.  Patient went to the kitchen and while trying to make a turn, she slipped and landed on the right side of her face, ribs and hip, she was unable to get up, but she crawled to her cell phone, though she was unable to make a call due to her dementia.  Daughter lives in [place] and was coincidentally on her way to go see the patient, on arrival, she was seen sitting down on the floor without being in any significant distress, she has hematoma to right side of her forehead.  Patient denies loss of consciousness and she was not on any blood thinners.  She was taken to the ED for evaluation and management.  ED  Course:  In the emergency department, she was hemodynamically stable.  Work-up in the ED showed normal CBC and CMP Right ribs and chest x-ray showed right seventh and eighth rib fractures without complicating factors. CT of head without contrast showed thin acute right frontal subdural hematoma measures 3 mm in thickness. No associated mass effect or midline shift. No skull fracture. 2. Right periorbital scalp hematoma. Right knee, right hip x-rays and CT cervical spine without contrast showed degenerative change without acute abnormality Neurosurgery was consulted and there was no indication for any surgical intervention at this time, it was recommended for CT to be repeated 6 hours after initial CT of head per ED physician.  Hospitalist was asked to admit patient for further evaluation and management.  Review of Systems: This cannot be obtained at this time due to patient's dementia     Hospital Course:     Principal Problem:   Acute metabolic encephalopathy Active Problems:   Accidental fall   Alzheimer's dementia ([place])   Scalp hematoma, initial encounter/Right frontal and supraorbital scalp swelling and hematoma   Acute lower UTI   Rib fractures   Essential hypertension   Urge incontinence   A/p 1) status post mechanical fall with closed head injury/subdural hematoma --- --CT head with Intracranial hemorrhage/ tiny thin right frontal subdural hematoma that measures 28mm and Right frontal and supraorbital scalp hematoma--- -Repeat CT head stable -physical therapy eval appreciated recommends home health PT -No new acute neuro deficits -Fall precaution discussed with patient's daughter who is primary caregiver  2) acute metabolic encephalopathy superimposed on baseline underlying dementia--- may be due to UTI--- patient received IV Rocephin for UTI, discharged on p.o. Keflex pending urine culture results  3) possible UTI--manage as above #2  4) Alzheimer's dementia--advanced with  behavioral disturbance and psychosis--- continue Aricept -May use  trazodone nightly for sleep  5)intermittent hematuria with urge incontinence--continue Detrol, keep appointment with urologist  6)Rt sided rib fractures--- status post mechanical fall, conservative and supportive management  -Social/Ethics--plan of care discussed with patient's daughter,, patient's daughter will try and get additional help at home -Discharge home with home health PT and RN  Discharge Condition: stable  Follow UP--PCP and urologist   Follow-up [place]-Home Follow up.   Specialty: Home Health Services Why:   RN/ PT - they will call within 48 hours to set up first appointment.             Diet and Activity recommendation:  As advised  Discharge Instructions    Discharge Instructions    Call MD for:  difficulty breathing, headache or visual disturbances   Complete by: As directed    Call MD for:  persistant dizziness or light-headedness   Complete by: As directed    Call MD for:  severe uncontrolled pain   Complete by: As directed    Call MD for:  temperature >100.4   Complete by: As directed    Diet general   Complete by: As directed    Discharge instructions   Complete by: As directed    1) please take medications as prescribed  2) please call [phone] on Wednesday, 05/19/2020 to get results of the urine culture   Walk with assistance   Complete by: As directed    Walker    Complete by: As directed        Discharge Medications     Allergies as of 05/17/2020      Reactions   Sulfa Antibiotics Nausea Only      Medication List    TAKE these medications   acetaminophen 500 MG tablet Commonly known as: TYLENOL Take 500 mg by mouth every 6 (six) hours as needed.   cephALEXin 250 MG capsule Commonly known as: Keflex Take 1 capsule (250 mg total) by mouth 3 (three) times daily for 5 days.   donepezil 5 MG tablet Commonly known as:  ARICEPT Take 5 mg by mouth at bedtime.   tolterodine 4 MG 24 hr capsule Commonly known as: DETROL LA Take 1 capsule (4 mg total) by mouth daily. At bedtime   traZODone 50 MG tablet Commonly known as: DESYREL Take 1 tablet (50 mg total) by mouth at bedtime. For Sleep       Major procedures and Radiology Reports - PLEASE review detailed and final reports for all details, in brief -   DG Ribs Unilateral W/Chest Right  Result Date: 05/16/2020 CLINICAL DATA:  Recent fall with right-sided chest pain, initial encounter EXAM: RIGHT RIBS AND CHEST - 3+ VIEW COMPARISON:  None. FINDINGS: Mildly displaced fractures of the right seventh and eighth ribs are noted. Old rib fractures are noted with healing anteriorly. No pneumothorax is seen. The lungs are clear. Large hiatal hernia is noted. IMPRESSION: Right seventh and eighth rib fractures without complicating factors. Electronically Signed   By: Inez Catalina M.D.   On: 05/16/2020 19:06   CT Head Wo Contrast  Addendum Date: 05/17/2020   ADDENDUM REPORT: 05/17/2020 16:11 ADDENDUM: Reviewed this exam on 4:09 p.m. on 05/17/2020 with Dr. Denton Brick. There is a tiny thin right frontal subdural hematoma that measures 2 mm, not reported on original exam. This is best appreciated on series 2, image 11. This is not significantly changed or possibly diminished in size from exam earlier that day. Findings discussed with referring clinician. Electronically Signed   By: Keith Rake M.D.   On: 05/17/2020 16:11   Result Date: 05/17/2020 CLINICAL DATA:  Fall with bruising to the right side of the forehead EXAM: CT HEAD WITHOUT CONTRAST TECHNIQUE: Contiguous axial images were obtained from the base of the skull through the vertex without intravenous contrast. COMPARISON:  Radiograph 10/14/2020 FINDINGS: Brain: No evidence of acute infarction, hemorrhage, hydrocephalus, extra-axial collection, visible mass lesion or mass effect. Symmetric prominence of the ventricles,  cisterns and sulci compatible with parenchymal volume loss. Patchy areas of white matter hypoattenuation are most compatible with chronic microvascular angiopathy. Vascular: Atherosclerotic calcification of the carotid siphons and intradural vertebral arteries. No hyperdense vessel. Skull: Right frontal and supraorbital scalp swelling and hematoma measuring up to 6  mm maximal thickness. No subjacent calvarial fracture or discernible facial bone fracture within the margins of imaging. No other significant sites of scalp swelling or hematoma. Sinuses/Orbits: Mild mural thickening in the paranasal sinuses predominantly the ethmoids and right maxillary sinus. No pneumatized secretions or air-fluid levels. Mastoid air cells and middle ear cavities are clear. Some mild right periorbital swelling associated with scalp swelling above. Swelling is confined to the preseptal tissues. No retro septal gas, stranding or hemorrhage. Included orbital structures are otherwise unremarkable. Other: None. IMPRESSION: 1. Right frontal and supraorbital scalp swelling and hematoma measuring up to 6 mm maximal thickness. No subjacent calvarial fracture or discernible facial bone fracture within the margins of imaging. 2. No acute intracranial abnormality. 3. Chronic microvascular angiopathy and parenchymal volume loss. Electronically Signed: By: Lovena Le M.D. On: 05/17/2020 00:05   CT Head Wo Contrast  Result Date: 05/16/2020 CLINICAL DATA:  Fall at home this morning with right forehead bruising. EXAM: CT HEAD WITHOUT CONTRAST TECHNIQUE: Contiguous axial images were obtained from the base of the skull through the vertex without intravenous contrast. COMPARISON:  None. FINDINGS: Brain: Thin acute right frontal subdural hematoma measures 3 mm in thickness. No associated mass effect. No midline shift. No hemorrhage elsewhere. Normal for age atrophy and chronic small vessel ischemia. No evidence of acute infarct. No hydrocephalus.  Basilar cisterns are patent. Vascular: Atherosclerosis of skullbase vasculature without hyperdense vessel or abnormal calcification. Skull: No fracture or focal lesion. Sinuses/Orbits: No acute findings. Mild mucosal thickening of right maxillary sinus. Mastoid air cells are clear. No acute fracture. Other: Right periorbital scalp hematoma. IMPRESSION: 1. Thin acute right frontal subdural hematoma measures 3 mm in thickness. No associated mass effect or midline shift. No skull fracture. 2. Right periorbital scalp hematoma. 3. Normal for age atrophy and chronic small vessel ischemia. Critical Value/emergent results were called by telephone at the time of interpretation on 05/16/2020 at 6:26 pm to provider [facility] , who verbally acknowledged these results. Electronically Signed   By: Keith Rake M.D.   On: 05/16/2020 18:27   CT Cervical Spine Wo Contrast  Result Date: 05/16/2020 CLINICAL DATA:  Fall at home with facial bruising. EXAM: CT CERVICAL SPINE WITHOUT CONTRAST TECHNIQUE: Multidetector CT imaging of the cervical spine was performed without intravenous contrast. Multiplanar CT image reconstructions were also generated. COMPARISON:  None. FINDINGS: Alignment: Exaggerated cervical lordosis.  No traumatic subluxation. Skull base and vertebrae: No acute fracture. Vertebral body heights are maintained. The dens and skull base are intact. Prominent degenerative changes C1-C2. Soft tissues and spinal canal: No prevertebral fluid or swelling. No visible canal hematoma. Disc levels: Multilevel endplate spurring with preservation of disc spaces. Upper chest: No acute or unexpected findings. Other: None. IMPRESSION: Degenerative change in the cervical spine without acute fracture or subluxation. Electronically Signed   By: Keith Rake M.D.   On: 05/16/2020 18:29   DG Knee Complete 4 Views Right  Result Date: 05/16/2020 CLINICAL DATA:  Recent fall with right knee pain, initial encounter EXAM: RIGHT  KNEE - COMPLETE 4+ VIEW COMPARISON:  01/22/2017 FINDINGS: Tricompartmental degenerative changes are noted without acute fracture or dislocation. No joint effusion is seen. No soft tissue abnormality is noted. IMPRESSION: Degenerative change without acute abnormality. Electronically Signed   By: Inez Catalina M.D.   On: 05/16/2020 19:08   DG Hip Unilat W or Wo Pelvis 2-3 Views Right  Result Date: 05/16/2020 CLINICAL DATA:  Recent fall with right hip pain, initial encounter EXAM: DG HIP (  WITH OR WITHOUT PELVIS) 2-3V RIGHT COMPARISON:  None. FINDINGS: Pelvic ring is intact. Mild degenerative changes of the hip joints are noted bilaterally. No acute fracture or dislocation is noted. No soft tissue abnormality is seen. Degenerative changes of lumbar spine are noted. IMPRESSION: Degenerative changes without acute abnormality. Electronically Signed   By: Inez Catalina M.D.   On: 05/16/2020 19:07    Micro Results    Recent Results (from the past 240 hour(s))  SARS Coronavirus 2 by RT PCR (hospital order, performed in [facility] hospital lab) Nasopharyngeal Nasopharyngeal Swab     Status: None   Collection Time: 05/16/20  8:21 PM   Specimen: Nasopharyngeal Swab  Result Value Ref Range Status   SARS Coronavirus 2 NEGATIVE NEGATIVE Final    Comment: (NOTE) SARS-CoV-2 target nucleic acids are NOT DETECTED.  The SARS-CoV-2 RNA is generally detectable in upper and lower respiratory specimens during the acute phase of infection. The lowest concentration of SARS-CoV-2 viral copies this assay can detect is 250 copies / mL. A negative result does not preclude SARS-CoV-2 infection and should not be used as the sole basis for treatment or other patient management decisions.  A negative result may occur with improper specimen collection / handling, submission of specimen other than nasopharyngeal swab, presence of viral mutation(s) within the areas targeted by this assay, and inadequate number of viral  copies (<250 copies / mL). A negative result must be combined with clinical observations, patient history, and epidemiological information.  Fact Sheet for Patients:   StrictlyIdeas.no  Fact Sheet for Healthcare Providers: BankingDealers.co.za  This test is not yet approved or  cleared by the [place] FDA and has been authorized for detection and/or diagnosis of SARS-CoV-2 by FDA under an Emergency Use Authorization (EUA).  This EUA will remain in effect (meaning this test can be used) for the duration of the COVID-19 declaration under Section 564(b)(1) of the Act, 21 U.S.C. section 360bbb-3(b)(1), unless the authorization is terminated or revoked sooner.  Performed at [facility], [address], [place], [place] [address]     Today   Subjective    Melissa Deleon today has no new complaints No fever  Or chills   No Nausea, Vomiting or Diarrhea       Patient has been seen and examined prior to discharge   Objective   Blood pressure (!) 150/78, pulse 84, temperature 97.8 F (36.6 C), temperature source Oral, resp. rate 19, height 4\' 9"  (1.448 m), weight 50.8 kg, SpO2 98 %.  No intake or output data in the 24 hours ending 05/17/20 1619  Exam Gen:- Awake Alert, no acute distress  HEENT:-Right-sided scalp hematoma/ecchymosis including the periorbital and right forehead areas Neck-Supple Neck,No JVD,.  Lungs-  CTAB , good air movement bilaterally  CV- S1, S2 normal, regular Abd-  +ve B.Sounds, Abd Soft, No tenderness,    Extremity/Skin:- No  edema,   good pulses Psych-affect is appropriate, patient is disoriented, baseline cognitive and memory deficits noted  neuro-no new focal deficits, no tremors    Data Review   CBC w Diff:  Lab Results  Component Value Date   WBC 6.7 05/17/2020   HGB 14.8 05/17/2020   HCT 46.7 (H) 05/17/2020   PLT 350 05/17/2020   LYMPHOPCT 40 01/06/2016   MONOPCT 9 01/06/2016   EOSPCT 5  01/06/2016   BASOPCT 1 01/06/2016    CMP:  Lab Results  Component Value Date   NA 137 05/17/2020   K 3.2 (L)  05/17/2020   CL 100 05/17/2020   CO2 28 05/17/2020   BUN 12 05/17/2020   CREATININE 0.71 05/17/2020   PROT 7.4 05/17/2020   ALBUMIN 4.1 05/17/2020   BILITOT 0.7 05/17/2020   ALKPHOS 88 05/17/2020   AST 24 05/17/2020   ALT 19 05/17/2020  .   Total Discharge time is about 33 minutes  Roxan Hockey M.D on 05/17/2020 at 4:19 PM  Go to [email or web address] -  for contact info  [facility] - Office  [phone]

## 2020-05-19 LAB — URINE CULTURE
Culture: >=100000 — AB
Special Requests: NORMAL

## 2020-07-06 ENCOUNTER — Ambulatory Visit: Payer: Self-pay | Admitting: Obstetrics & Gynecology

## 2020-07-16 ENCOUNTER — Ambulatory Visit (INDEPENDENT_AMBULATORY_CARE_PROVIDER_SITE_OTHER): Payer: PPO | Admitting: Obstetrics & Gynecology

## 2020-07-16 ENCOUNTER — Other Ambulatory Visit: Payer: Self-pay

## 2020-07-16 ENCOUNTER — Encounter: Payer: Self-pay | Admitting: Obstetrics & Gynecology

## 2020-07-16 VITALS — BP 159/80 | HR 66 | Ht <= 58 in | Wt 114.0 lb

## 2020-07-16 DIAGNOSIS — N813 Complete uterovaginal prolapse: Secondary | ICD-10-CM | POA: Diagnosis not present

## 2020-07-16 DIAGNOSIS — Z4689 Encounter for fitting and adjustment of other specified devices: Secondary | ICD-10-CM

## 2020-07-16 NOTE — Progress Notes (Signed)
Chief Complaint  Patient presents with  . Pessary Check    Blood pressure (!) 159/80, pulse 66, height 4\' 10"  (1.473 m), weight 114 lb (51.7 kg).  Melissa Deleon presents today for routine follow up related to her pessary.   She uses a Milex ring with support #2 She reports no vaginal discharge and no vaginal bleeding   Likert scale(1 not bothersome -5 very bothersome)  :  1  Exam reveals no undue vaginal mucosal pressure of breakdown, no discharge and no vaginal bleeding.  Vaginal Epithelial Abnormality Classification System:   0 0    No abnormalities 1    Epithelial erythema 2    Granulation tissue 3    Epithelial break or erosion, 1 cm or less 4    Epithelial break or erosion, 1 cm or greater  The pessary is removed, cleaned and replaced without difficulty.      ICD-10-CM   1. Pessary maintenance, Milex ring with support #2  Z46.89   2. Uterovaginal prolapse, complete  N81.3      Melissa Deleon will be sen back in 4 months for continued follow up.  Florian Buff, MD  07/16/2020 11:43 AM

## 2020-07-31 DIAGNOSIS — M79604 Pain in right leg: Secondary | ICD-10-CM | POA: Diagnosis not present

## 2020-07-31 DIAGNOSIS — M7122 Synovial cyst of popliteal space [Baker], left knee: Secondary | ICD-10-CM | POA: Diagnosis not present

## 2020-07-31 DIAGNOSIS — R2242 Localized swelling, mass and lump, left lower limb: Secondary | ICD-10-CM | POA: Diagnosis not present

## 2020-07-31 DIAGNOSIS — M19071 Primary osteoarthritis, right ankle and foot: Secondary | ICD-10-CM | POA: Diagnosis not present

## 2020-07-31 DIAGNOSIS — M170 Bilateral primary osteoarthritis of knee: Secondary | ICD-10-CM | POA: Diagnosis not present

## 2020-07-31 DIAGNOSIS — M19072 Primary osteoarthritis, left ankle and foot: Secondary | ICD-10-CM | POA: Diagnosis not present

## 2020-07-31 DIAGNOSIS — M79661 Pain in right lower leg: Secondary | ICD-10-CM | POA: Diagnosis not present

## 2020-07-31 DIAGNOSIS — M79662 Pain in left lower leg: Secondary | ICD-10-CM | POA: Diagnosis not present

## 2020-11-02 ENCOUNTER — Ambulatory Visit (HOSPITAL_COMMUNITY)
Admission: RE | Admit: 2020-11-02 | Discharge: 2020-11-02 | Disposition: A | Discharge status: Home / Self Care | Payer: PPO | Source: Ambulatory Visit | Attending: Internal Medicine | Admitting: Internal Medicine

## 2020-11-02 ENCOUNTER — Other Ambulatory Visit (HOSPITAL_COMMUNITY): Payer: Self-pay | Admitting: Internal Medicine

## 2020-11-02 ENCOUNTER — Other Ambulatory Visit: Payer: Self-pay

## 2020-11-02 ENCOUNTER — Other Ambulatory Visit: Payer: Self-pay | Admitting: Internal Medicine

## 2020-11-02 DIAGNOSIS — R188 Other ascites: Secondary | ICD-10-CM | POA: Diagnosis not present

## 2020-11-02 DIAGNOSIS — R109 Unspecified abdominal pain: Secondary | ICD-10-CM

## 2020-11-02 LAB — POCT I-STAT CREATININE: Creatinine, Ser: 0.7 mg/dL (ref 0.44–1.00)

## 2020-11-02 MED ORDER — IOHEXOL 9 MG/ML PO SOLN
ORAL | Status: AC
  Filled 2020-11-02: qty 1000

## 2020-11-02 MED ORDER — IOHEXOL 300 MG/ML  SOLN
100.0000 mL | Freq: Once | INTRAMUSCULAR | Status: AC | PRN
  Administered 2020-11-02: 100 mL via INTRAVENOUS

## 2020-11-15 ENCOUNTER — Ambulatory Visit: Payer: PPO | Admitting: Obstetrics & Gynecology

## 2020-11-15 ENCOUNTER — Encounter: Payer: Self-pay | Admitting: Obstetrics & Gynecology

## 2020-11-15 ENCOUNTER — Other Ambulatory Visit: Payer: Self-pay

## 2020-11-15 VITALS — BP 149/80 | HR 79 | Ht <= 58 in | Wt 113.0 lb

## 2020-11-15 DIAGNOSIS — N813 Complete uterovaginal prolapse: Secondary | ICD-10-CM

## 2020-11-15 DIAGNOSIS — Z4689 Encounter for fitting and adjustment of other specified devices: Secondary | ICD-10-CM | POA: Diagnosis not present

## 2020-11-15 DIAGNOSIS — K5901 Slow transit constipation: Secondary | ICD-10-CM

## 2020-11-15 MED ORDER — POLYETHYLENE GLYCOL 3350 17 GM/SCOOP PO POWD: ORAL | 11 refills | Status: AC

## 2020-11-15 NOTE — Progress Notes (Signed)
Chief Complaint  Patient presents with   Pessary Check    Blood pressure (!) 149/80, pulse 79, height '4\' 8"'$  (1.422 m), weight 113 lb (51.3 kg).  Melissa Deleon presents today for routine follow up related to her pessary.   She uses a Milex ring with support #2 She reports no vaginal discharge and no vaginal bleeding   Likert scale(1 not bothersome -5 very bothersome)  :  1 1 Exam reveals no undue vaginal mucosal pressure of breakdown, no discharge and no vaginal bleeding.  Vaginal Epithelial Abnormality Classification System:   0 0    No abnormalities 1    Epithelial erythema 2    Granulation tissue 3    Epithelial break or erosion, 1 cm or less 4    Epithelial break or erosion, 1 cm or greater  The pessary is removed, cleaned and replaced without difficulty.      ICD-10-CM   1. Pessary maintenance, Milex ring with support #2  Z46.89     2. Uterovaginal prolapse, complete  N81.3     3. Slow transit constipation  K59.01       Severe constipation: water/prunes/Miralax/enema to get started  Melissa Deleon will be sen back in 4 months for continued follow up.  Florian Buff, MD  11/15/2020 2:07 PM

## 2020-11-17 ENCOUNTER — Encounter: Payer: Self-pay | Admitting: Internal Medicine

## 2021-01-06 ENCOUNTER — Other Ambulatory Visit: Payer: Self-pay

## 2021-01-06 ENCOUNTER — Ambulatory Visit: Payer: PPO | Admitting: Internal Medicine

## 2021-01-06 ENCOUNTER — Encounter: Payer: Self-pay | Admitting: Internal Medicine

## 2021-01-06 VITALS — BP 144/82 | HR 78 | Temp 97.5°F | Ht <= 58 in | Wt 106.8 lb

## 2021-01-06 DIAGNOSIS — R1031 Right lower quadrant pain: Secondary | ICD-10-CM | POA: Diagnosis not present

## 2021-01-06 DIAGNOSIS — K5900 Constipation, unspecified: Secondary | ICD-10-CM | POA: Diagnosis not present

## 2021-01-06 DIAGNOSIS — G8929 Other chronic pain: Secondary | ICD-10-CM

## 2021-01-06 NOTE — Progress Notes (Signed)
Primary Care Physician:  Celene Squibb, MD Primary Gastroenterologist:  Dr. Abbey Chatters  Chief Complaint  Patient presents with   Abdominal Pain   Constipation   Hiatal Hernia    HPI:   Melissa Deleon is a 87 y.o. female who presents to the clinic today for evaluation by referral from her PCP Dr. Nevada Crane for evaluation.  She has multiple complaints for me today.  She is accompanied by her daughter.  States that she has had right lower quadrant abdominal pain.  Intermittent in nature.  Also notes chronic constipation.  She was given MiraLAX and states this helps when she takes it though patient does not take it unless her daughter goes to her house which is difficult as she lives 50 miles away.  Plan is to get patient to assisted living soon.  She had a CT abdomen pelvis on 11/02/2020 which showed moderate retained stool in the rectosigmoid colon consistent with constipation, sigmoid diverticulosis, large hiatal hernia.  Patient denies any upper GI symptoms including heartburn, reflux, dysphagia, odynophagia, epigastric pain.  Last colonoscopy greater than 10 years ago which she states was unremarkable.  No family history of colorectal malignancy.  Past Medical History:  Diagnosis Date   Breast cancer ([place]) 05/18/2014   Chronic renal disease, stage 3, moderately decreased glomerular filtration rate (GFR) between 30-59 mL/min/1.73 square meter (HCC) 08/13/2014   Dementia ([place])    Ductal carcinoma in situ (DCIS) of right breast 08/13/2014   GERD (gastroesophageal reflux disease)    Hypertension     Past Surgical History:  Procedure Laterality Date   BACK SURGERY     Bladder ring     BREAST BIOPSY Right 06/10/2014   Procedure: RIGHT BREAST BIOPSY AFTER NEEDLE LOCALIZATION;  Surgeon: Jamesetta So, MD;  Location: [facility];  Service: General;  Laterality: Right;   BREAST LUMPECTOMY Right 06/2014   HYSTEROSCOPY WITH D & C N/A 06/02/2015   Procedure: DILATATION AND CURETTAGE /HYSTEROSCOPY;  Surgeon:  Florian Buff, MD;  Location: [facility];  Service: Gynecology;  Laterality: N/A;   KYPHOPLASTY N/A 06/11/2012   Procedure: KYPHOPLASTY (C-Arm x 2);  Surgeon: Otilio Connors, MD;  Location: [facility];  Service: Neurosurgery;  Laterality: N/A;  Lumbar two Kyphoplasty   LUMBAR LAMINECTOMY/DECOMPRESSION MICRODISCECTOMY  03/07/2012   Procedure: LUMBAR LAMINECTOMY/DECOMPRESSION MICRODISCECTOMY 1 LEVEL;  Surgeon: Otilio Connors, MD;  Location: [place] [facility]UR[facility] ORS;  Service: Neurosurgery;  Laterality: Left;  Left Lumbar four-five Laminectomy for Synovial cyst   NO PAST SURGERIES     RING PLACED TO HOLD BLADDER    POLYPECTOMY N/A 06/02/2015   Procedure: Removal of endometrial Polyps;  Surgeon: Florian Buff, MD;  Location: [facility];  Service: Gynecology;  Laterality: N/A;    Current Outpatient Medications  Medication Sig Dispense Refill   acetaminophen (TYLENOL) 500 MG tablet Take 500 mg by mouth every 6 (six) hours as needed.     donepezil (ARICEPT) 5 MG tablet Take 5 mg by mouth at bedtime.     polyethylene glycol powder (GLYCOLAX/MIRALAX) 17 GM/SCOOP powder 1 scoop daily or as needed 255 g 11   tolterodine (DETROL LA) 4 MG 24 hr capsule Take 1 capsule (4 mg total) by mouth daily. At bedtime 30 capsule 11   traZODone (DESYREL) 50 MG tablet Take 1 tablet (50 mg total) by mouth at bedtime. For Sleep 30 tablet 3   ibuprofen (ADVIL) 600 MG tablet Take by mouth. (Patient not taking: Reported on 01/06/2021)  No current facility-administered medications for this visit.    Allergies as of 01/06/2021 - Review Complete 01/06/2021  Allergen Reaction Noted   Sulfa antibiotics Nausea Only 03/05/2012    Family History  Problem Relation Age of Onset   Diabetes Mother    Heart attack Father    Cancer Sister        breast    Social History   Socioeconomic History   Marital status: Widowed    Spouse name: Not on file   Number of children: 1   Years of education: Not on file   Highest education level: Not on  file  Occupational History   Not on file  Tobacco Use   Smoking status: Never   Smokeless tobacco: Never  Vaping Use   Vaping Use: Never used  Substance and Sexual Activity   Alcohol use: No   Drug use: No   Sexual activity: Not Currently    Birth control/protection: Post-menopausal  Other Topics Concern   Not on file  Social History Narrative   Not on file   Social Determinants of Health   Financial Resource Strain: Not on file  Food Insecurity: Not on file  Transportation Needs: Not on file  Physical Activity: Not on file  Stress: Not on file  Social Connections: Not on file  Intimate Partner Violence: Not on file    Subjective: Review of Systems  Constitutional:  Negative for chills and fever.  HENT:  Negative for congestion and hearing loss.   Eyes:  Negative for blurred vision and double vision.  Respiratory:  Negative for cough and shortness of breath.   Cardiovascular:  Negative for chest pain and palpitations.  Gastrointestinal:  Positive for abdominal pain and constipation. Negative for blood in stool, diarrhea, heartburn, melena and vomiting.  Genitourinary:  Negative for dysuria and urgency.  Musculoskeletal:  Negative for joint pain and myalgias.  Skin:  Negative for itching and rash.  Neurological:  Negative for dizziness and headaches.  Psychiatric/Behavioral:  Negative for depression. The patient is not nervous/anxious.       Objective: BP (!) 144/82   Pulse 78   Temp (!) 97.5 F (36.4 C) (Temporal)   Ht 4\' 10"  (1.473 m)   Wt 106 lb 12.8 oz (48.4 kg)   BMI 22.32 kg/m  Physical Exam Constitutional:      Appearance: Normal appearance.  HENT:     Head: Normocephalic and atraumatic.  Eyes:     Extraocular Movements: Extraocular movements intact.     Conjunctiva/sclera: Conjunctivae normal.  Cardiovascular:     Rate and Rhythm: Normal rate and regular rhythm.  Pulmonary:     Effort: Pulmonary effort is normal.     Breath sounds: Normal  breath sounds.  Abdominal:     General: Bowel sounds are normal.     Palpations: Abdomen is soft.  Musculoskeletal:        General: No swelling. Normal range of motion.     Cervical back: Normal range of motion and neck supple.  Skin:    General: Skin is warm and dry.     Coloration: Skin is not jaundiced.  Neurological:     General: No focal deficit present.     Mental Status: She is alert and oriented to person, place, and time.  Psychiatric:        Mood and Affect: Mood normal.        Behavior: Behavior normal.     Assessment: *Constipation *Right lower quadrant abdominal pain  Plan: Patient presenting with irritable bowel syndrome-constipation predominant.  Believe her abdominal pain is associated with this.  Recommended that we be more aggressive about treating her constipation.  Recommend she start taking 1 capful of MiraLAX every morning.  If this is not adequate she can take 2 capfuls every day.  If this still is not adequate then I would recommend she add over-the-counter Dulcolax once a day as well.  If above regimen does not work, we can consider Linzess.  Given her age and comorbidities, I would avoid colonoscopy unless necessary.  Patient agrees.  Patient to be enrolled in assisted living soon which will likely help with her medication compliance.  Counseled on the vast importance of drinking at least 4 to 6 glasses of water daily.  Follow-up in 3 to 4 months.  Thank you Dr. Nevada Crane for the kind referral.  01/06/2021 2:44 PM   Disclaimer: This note was dictated with voice recognition software. Similar sounding words can inadvertently be transcribed and may not be corrected upon review.

## 2021-01-06 NOTE — Patient Instructions (Signed)
For your constipation, recommend adding MiraLAX (polyethylene glycol) 1 capful daily.  If this is not adequate you can go up to 2 capfuls daily.  If this still is not adequate then I would add 1 dose of Dulcolax (bisacodyl) daily.  We can consider trialing on Linzess if above regimen does not work.  It is important that you drink at least 4 to 6 glasses of water daily.  Follow-up with GI in 3 to 4 months.  It was very nice meeting both you today.  Dr. Abbey Chatters  At [facility] we value your feedback. You may receive a survey about your visit today. Please share your experience as we strive to create trusting relationships with our patients to provide genuine, compassionate, quality care.  We appreciate your understanding and patience as we review any laboratory studies, imaging, and other diagnostic tests that are ordered as we care for you. Our office policy is 5 business days for review of these results, and any emergent or urgent results are addressed in a timely manner for your best interest. If you do not hear from our office in 1 week, please contact us.   We also encourage the use of MyChart, which contains your medical information for your review as well. If you are not enrolled in this feature, an access code is on this after visit summary for your convenience. Thank you for allowing [place] to be involved in your care.  It was great to see you today!  I hope you have a great rest of your summer!!    Elon Alas. Abbey Chatters, D.O. Gastroenterology and Hepatology [facility]

## 2021-01-11 DIAGNOSIS — Z23 Encounter for immunization: Secondary | ICD-10-CM | POA: Diagnosis not present

## 2021-01-11 DIAGNOSIS — Z111 Encounter for screening for respiratory tuberculosis: Secondary | ICD-10-CM | POA: Diagnosis not present

## 2021-01-11 DIAGNOSIS — G3184 Mild cognitive impairment, so stated: Secondary | ICD-10-CM | POA: Diagnosis not present

## 2021-01-17 DIAGNOSIS — E43 Unspecified severe protein-calorie malnutrition: Secondary | ICD-10-CM | POA: Diagnosis not present

## 2021-01-17 DIAGNOSIS — N3281 Overactive bladder: Secondary | ICD-10-CM | POA: Diagnosis not present

## 2021-01-17 DIAGNOSIS — F5104 Psychophysiologic insomnia: Secondary | ICD-10-CM | POA: Diagnosis not present

## 2021-01-17 DIAGNOSIS — G301 Alzheimer's disease with late onset: Secondary | ICD-10-CM | POA: Diagnosis not present

## 2021-01-27 DIAGNOSIS — F99 Mental disorder, not otherwise specified: Secondary | ICD-10-CM | POA: Diagnosis not present

## 2021-01-27 DIAGNOSIS — M6281 Muscle weakness (generalized): Secondary | ICD-10-CM | POA: Diagnosis not present

## 2021-01-27 DIAGNOSIS — R269 Unspecified abnormalities of gait and mobility: Secondary | ICD-10-CM | POA: Diagnosis not present

## 2021-02-08 DIAGNOSIS — F99 Mental disorder, not otherwise specified: Secondary | ICD-10-CM | POA: Diagnosis not present

## 2021-02-08 DIAGNOSIS — L603 Nail dystrophy: Secondary | ICD-10-CM | POA: Diagnosis not present

## 2021-02-08 DIAGNOSIS — F0390 Unspecified dementia without behavioral disturbance: Secondary | ICD-10-CM | POA: Diagnosis not present

## 2021-02-08 DIAGNOSIS — I739 Peripheral vascular disease, unspecified: Secondary | ICD-10-CM | POA: Diagnosis not present

## 2021-02-10 DIAGNOSIS — F02B3 Dementia in other diseases classified elsewhere, moderate, with mood disturbance: Secondary | ICD-10-CM | POA: Diagnosis not present

## 2021-02-10 DIAGNOSIS — G301 Alzheimer's disease with late onset: Secondary | ICD-10-CM | POA: Diagnosis not present

## 2021-02-15 DIAGNOSIS — F99 Mental disorder, not otherwise specified: Secondary | ICD-10-CM | POA: Diagnosis not present

## 2021-02-15 DIAGNOSIS — F0390 Unspecified dementia without behavioral disturbance: Secondary | ICD-10-CM | POA: Diagnosis not present

## 2021-03-03 DIAGNOSIS — F0390 Unspecified dementia without behavioral disturbance: Secondary | ICD-10-CM | POA: Diagnosis not present

## 2021-03-03 DIAGNOSIS — F99 Mental disorder, not otherwise specified: Secondary | ICD-10-CM | POA: Diagnosis not present

## 2021-03-06 DIAGNOSIS — F0390 Unspecified dementia without behavioral disturbance: Secondary | ICD-10-CM | POA: Diagnosis not present

## 2021-03-06 DIAGNOSIS — R296 Repeated falls: Secondary | ICD-10-CM | POA: Diagnosis not present

## 2021-03-06 DIAGNOSIS — Z043 Encounter for examination and observation following other accident: Secondary | ICD-10-CM | POA: Diagnosis not present

## 2021-03-08 DIAGNOSIS — F0390 Unspecified dementia without behavioral disturbance: Secondary | ICD-10-CM | POA: Diagnosis not present

## 2021-03-08 DIAGNOSIS — F99 Mental disorder, not otherwise specified: Secondary | ICD-10-CM | POA: Diagnosis not present

## 2021-03-15 DIAGNOSIS — F0390 Unspecified dementia without behavioral disturbance: Secondary | ICD-10-CM | POA: Diagnosis not present

## 2021-03-15 DIAGNOSIS — F99 Mental disorder, not otherwise specified: Secondary | ICD-10-CM | POA: Diagnosis not present

## 2021-03-16 DIAGNOSIS — R319 Hematuria, unspecified: Secondary | ICD-10-CM | POA: Diagnosis not present

## 2021-03-16 DIAGNOSIS — F0390 Unspecified dementia without behavioral disturbance: Secondary | ICD-10-CM | POA: Diagnosis not present

## 2021-03-17 ENCOUNTER — Other Ambulatory Visit: Payer: Self-pay

## 2021-03-17 ENCOUNTER — Ambulatory Visit: Payer: PPO | Admitting: Obstetrics & Gynecology

## 2021-03-17 ENCOUNTER — Encounter: Payer: Self-pay | Admitting: Obstetrics & Gynecology

## 2021-03-17 VITALS — BP 143/78 | HR 86 | Ht <= 58 in | Wt 125.0 lb

## 2021-03-17 DIAGNOSIS — Z4689 Encounter for fitting and adjustment of other specified devices: Secondary | ICD-10-CM | POA: Diagnosis not present

## 2021-03-17 DIAGNOSIS — R269 Unspecified abnormalities of gait and mobility: Secondary | ICD-10-CM | POA: Diagnosis not present

## 2021-03-17 DIAGNOSIS — F99 Mental disorder, not otherwise specified: Secondary | ICD-10-CM | POA: Diagnosis not present

## 2021-03-17 DIAGNOSIS — N813 Complete uterovaginal prolapse: Secondary | ICD-10-CM

## 2021-03-17 DIAGNOSIS — M6281 Muscle weakness (generalized): Secondary | ICD-10-CM | POA: Diagnosis not present

## 2021-03-17 NOTE — Progress Notes (Signed)
     GYN VISIT Patient name: Melissa Deleon MRN [id number]  Date of birth: 12-Sep-1933 Chief Complaint:    Chief Complaint  Patient presents with   Pessary Check   History of Present Illness:   Melissa Deleon is a 87 y.o. G1P1001 PM female being seen today for pessary maintenance.     Blood pressure (!) 143/78, pulse 86, height 4\' 10"  (1.473 m), weight 125 lb (56.7 kg).  Melissa Deleon presents today for routine follow up related to her pessary.   She uses a ring #2 She reports no vaginal discharge and little vaginal bleeding.  Of note, pt struggles with Alzheimer's dementia.  She denies any issues currently.  Presents with daughter, pt is currently in a nursing facility.  Per daughter, pt was brought to ER due to dark red blood- unclear if urine or vaginal.  Urine culture was obtained- results pending.  She states no further work up was completed.   Likert scale(1 not bothersome -5 very bothersome)  :  1  Review of Systems:   Pertinent items are noted in HPI Denies fever/chills, dizziness, headaches, visual disturbances, fatigue, shortness of breath, chest pain, abdominal pain. Pertinent History Reviewed:  Reviewed past medical,surgical, social, obstetrical and family history.  Reviewed problem list, medications and allergies. Physical Assessment:   Vitals:   03/17/21 1414  BP: (!) 143/78  Pulse: 86  Weight: 125 lb (56.7 kg)  Height: 4\' 10"  (1.473 m)  Body mass index is 26.13 kg/m.       Physical Examination:   General appearance: alert, well appearing, and in no distress  Psych: mood appropriate, normal affect  Skin: warm & dry   Cardiovascular: normal heart rate noted  Respiratory: normal respiratory effort, no distress  Abdomen: soft, non-tender   Exam reveals no undue vaginal mucosal pressure of breakdown, little discharge and little vaginal bleeding.  Atrophic changes with light spotting noted- periurethral mucosa and perineum.  Pt denies tenderness/discomfort  during exam  Vaginal Epithelial Abnormality Classification System:   1 0    No abnormalities 1    Epithelial erythema 2    Granulation tissue 3    Epithelial break or erosion, 1 cm or less 4    Epithelial break or erosion, 1 cm or greater  The pessary is removed, cleaned and replaced without difficulty.    Extremities: 2+ edema   Chaperone: Angel Neas    Assessment & Plan:     ICD-10-CM   1. Pessary maintenance, Milex ring with support #2  Z46.89     2. Uterovaginal prolapse, complete  N81.3        -While pt would benefit from vaginal estrogen therapy- concern regarding ability to apply cream.  For now will continue pessary and monitor vaginal epithelium   Return in about 4 months (around 07/16/2021) for pessary follow up Dr. Nelda Marseille or Elonda Husky.   Janyth Pupa, DO Attending [place], [facility] for [employer], [place]

## 2021-03-24 DIAGNOSIS — F5105 Insomnia due to other mental disorder: Secondary | ICD-10-CM | POA: Diagnosis not present

## 2021-03-24 DIAGNOSIS — G301 Alzheimer's disease with late onset: Secondary | ICD-10-CM | POA: Diagnosis not present

## 2021-03-24 DIAGNOSIS — F02C0 Dementia in other diseases classified elsewhere, severe, without behavioral disturbance, psychotic disturbance, mood disturbance, and anxiety: Secondary | ICD-10-CM | POA: Diagnosis not present

## 2021-05-12 ENCOUNTER — Encounter: Payer: Self-pay | Admitting: Internal Medicine

## 2021-07-12 ENCOUNTER — Ambulatory Visit: Payer: PPO | Admitting: Obstetrics & Gynecology

## 2021-08-03 ENCOUNTER — Ambulatory Visit: Payer: PPO | Admitting: Obstetrics & Gynecology

## 2021-08-03 ENCOUNTER — Encounter: Payer: Self-pay | Admitting: Obstetrics & Gynecology

## 2021-08-03 VITALS — BP 135/84 | HR 74 | Ht 59.0 in | Wt 123.6 lb

## 2021-08-03 DIAGNOSIS — G300 Alzheimer's disease with early onset: Secondary | ICD-10-CM

## 2021-08-03 DIAGNOSIS — F0280 Dementia in other diseases classified elsewhere without behavioral disturbance: Secondary | ICD-10-CM

## 2021-08-03 DIAGNOSIS — N813 Complete uterovaginal prolapse: Secondary | ICD-10-CM

## 2021-08-03 DIAGNOSIS — T8389XA Other specified complication of genitourinary prosthetic devices, implants and grafts, initial encounter: Secondary | ICD-10-CM

## 2021-08-03 DIAGNOSIS — N898 Other specified noninflammatory disorders of vagina: Secondary | ICD-10-CM

## 2021-08-03 NOTE — Progress Notes (Signed)
? ? ? ?  GYN VISIT ?Patient name: Melissa Deleon MRN [id number]  Date of birth: [birth year] ?Chief Complaint:   ? ?Chief Complaint  ?Patient presents with  ? Pessary Check  ?  Having some bleeding with pessary  ? ?History of Present Illness:   ?Melissa Deleon is a 88 y.o. G1P1001 PM female being seen today for pessary concerns ? ?She is currently in a nursing facility due to dementia.  Over the weekend, her daughter was contacted due to worsening vaginal bleeding.  They reports that this was more than just light bleeding and felt that it was running down her leg and significant bleeding noted in the toilet.   ? ?Pt is unable to provide any history and states that "she doesn't believe them." ? ?She uses a Milex ring #2 ? ?Review of Systems:   ?Pertinent items are noted in HPI ? ?Pertinent History Reviewed:  ?Reviewed past medical,surgical, social, obstetrical and family history.  ?Reviewed problem list, medications and allergies. ?Physical Assessment:  ? ?Vitals:  ? 08/03/21 1133  ?BP: 135/84  ?Pulse: 74  ?Weight: 123 lb 9.6 oz (56.1 kg)  ?Height: '4\' 11"'$  (1.499 m)  ?Body mass index is 24.96 kg/m?. ? ?     Physical Examination:  ? General appearance: alert, not oriented to time or place ? Psych: confused, uncooperative ? Skin: warm & dry  ? Cardiovascular: normal heart rate noted ? Respiratory: normal respiratory effort, no distress ? Abdomen: soft, non-tender  ?GU: Examination difficult due to patient not wanting to cooperate.  Device removed.  Moderate brown and bright red blood noted on pessary and glove.  Difficulty examining vaginal mucosa, suspect area of erosion due to extent of bleeding ? ? Extremities: no edema  ? ?Chaperone: Melissa Deleon and patient's daughter   ? ?Assessment & Plan:  ? ?  ICD-10-CM   ?1. Vaginal erosion secondary to pessary use, initial encounter Melissa Deleon)  T83.89XA   ? N89.8   ?  ?2. Uterovaginal prolapse, complete  N81.3   ?  ?3. Early onset Alzheimer's dementia, unspecified dementia severity,  unspecified whether behavioral, psychotic, or mood disturbance or anxiety ([place])  G30.0   ? F02.80   ?  ?  ? ?-plan for short pessary break ?-based on change in daily activity, if she is able to void on her own without considerable discomfort, urinary retention or recurrent UTIs may be able to completely discontinue ?-at next visit, if asymptomatic may consider plan for UA and culture and possible PVR OR re-inserting pessary if symptomatic ?-Questions and concerns and above information reviewed with her daughter ? ? ?Return in about 4 weeks (around 08/31/2021) for pessary maintenance (Melissa Deleon or Melissa Deleon). ? ? ?Janyth Pupa, DO ?Attending [place], Faculty Practice ?Center for [place] ? ? ? ? ? ?  ?

## 2021-08-23 ENCOUNTER — Ambulatory Visit: Payer: PPO | Admitting: Obstetrics & Gynecology

## 2022-02-22 IMAGING — CT CT ABD-PELV W/ CM
2 of 5 series · 16 of 46 positions shown, 18 images · IV contrast (Omnipaque or Isovue)
Comparison: 04/19/2012

CLINICAL DATA: Left-sided abdominal pain for 1 week

EXAM:
CT ABDOMEN AND PELVIS WITH CONTRAST
TECHNIQUE: Multidetector CT imaging of the abdomen and pelvis was performed
using the standard protocol following bolus administration of
intravenous contrast.
CONTRAST:  100mL OMNIPAQUE IOHEXOL 300 MG/ML  SOLN

[Series 2: axial st · axial · 0.70mm/px · z∈[+986,+1331]mm · 13 of 79 slices shown, 15 images]
[im 5/79  soft-tissue]
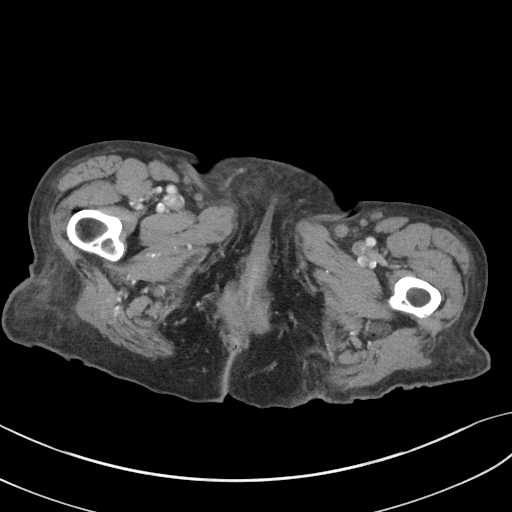
[im 5/79  bone]
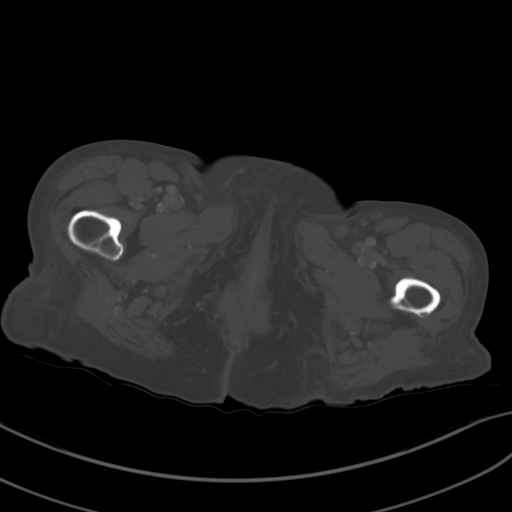
[im 9/79  soft-tissue]
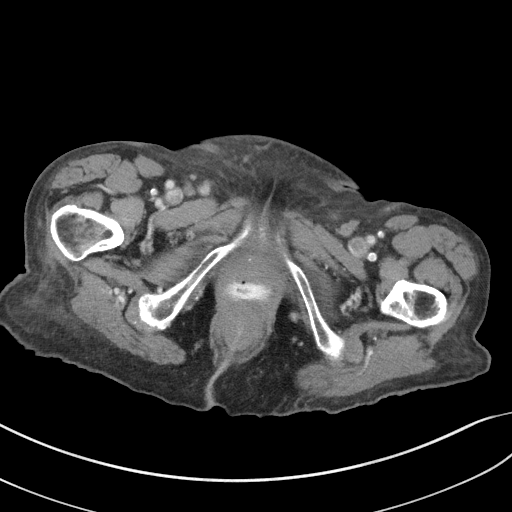
[im 18/79  soft-tissue]
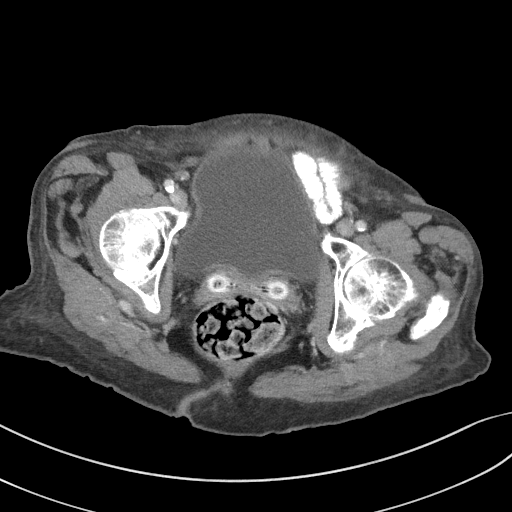
[im 22/79  soft-tissue]
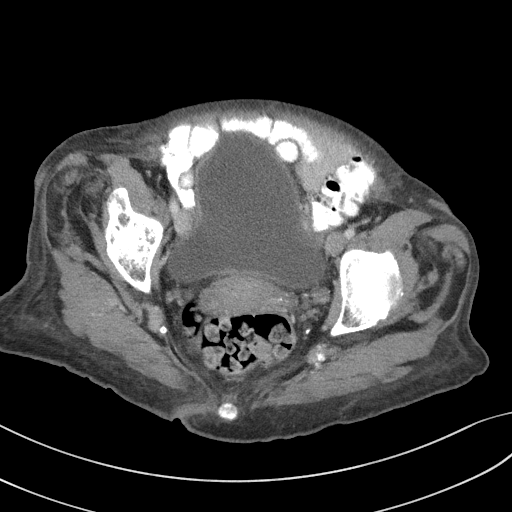
[im 27/79  soft-tissue]
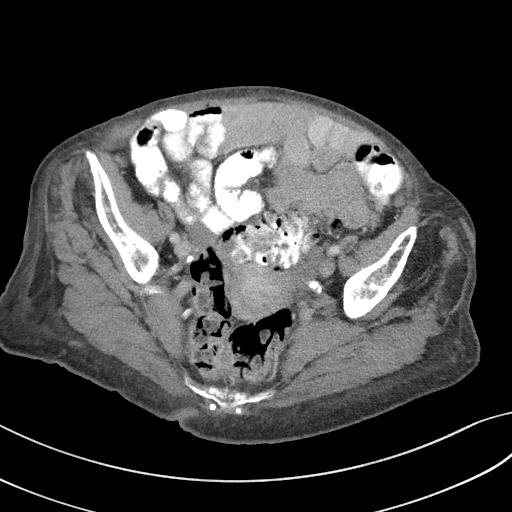
[im 35/79  soft-tissue]
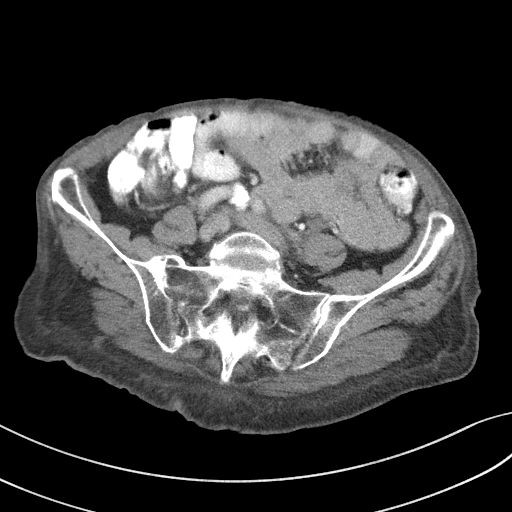
[im 40/79  soft-tissue]
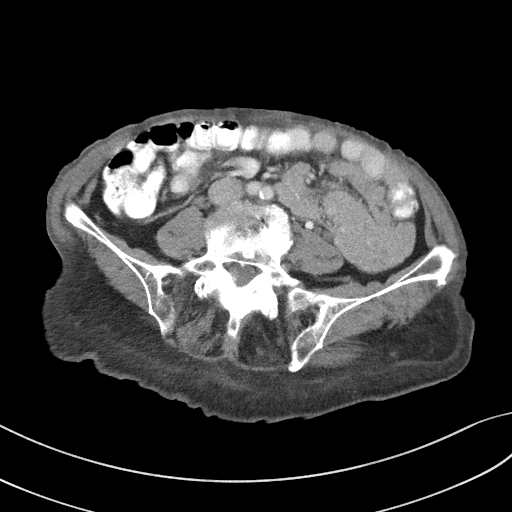
[im 44/79  soft-tissue]
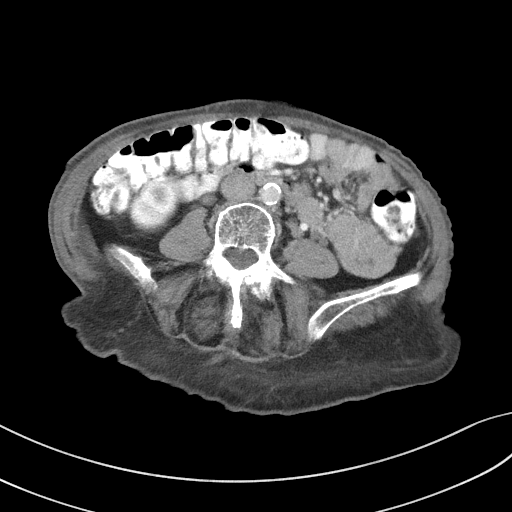
[im 53/79  soft-tissue]
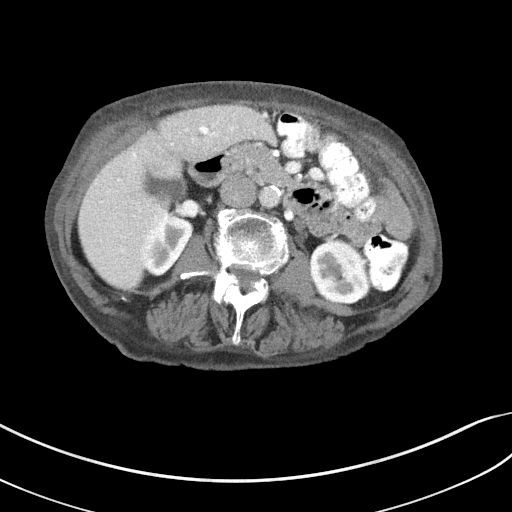
[im 53/79  bone]
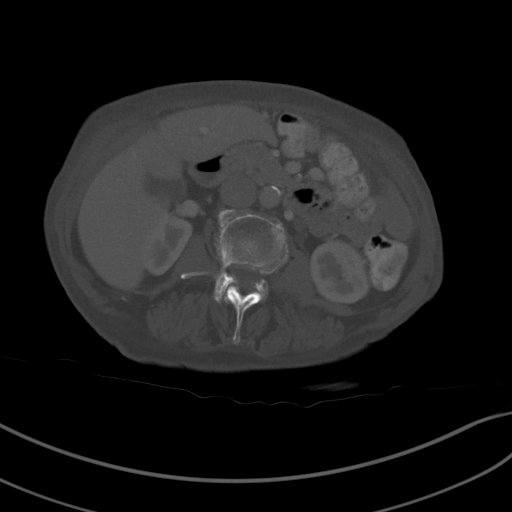
[im 57/79  soft-tissue]
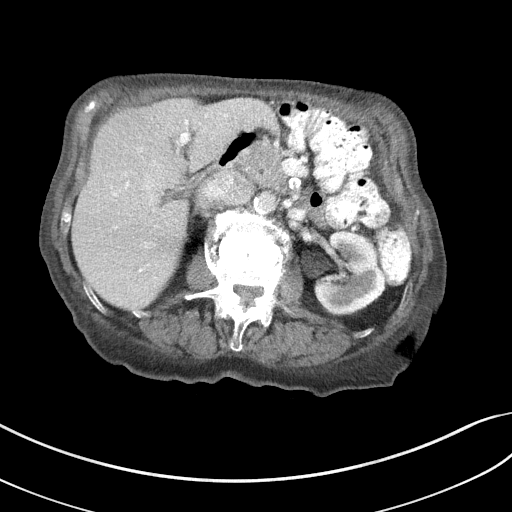
[im 61/79  soft-tissue]
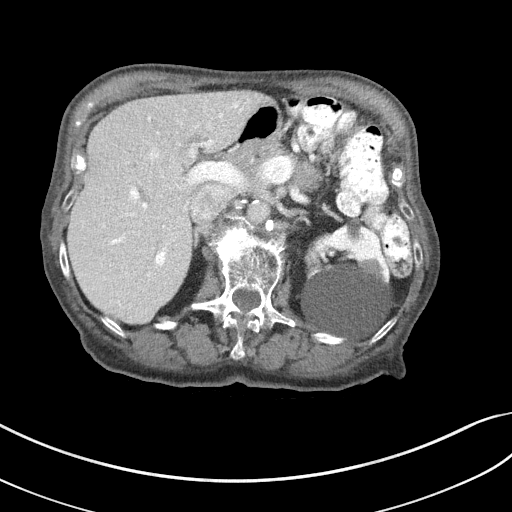
[im 70/79  soft-tissue]
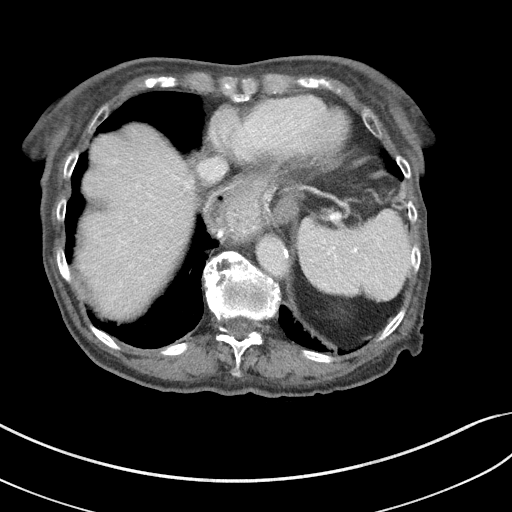
[im 74/79  soft-tissue]
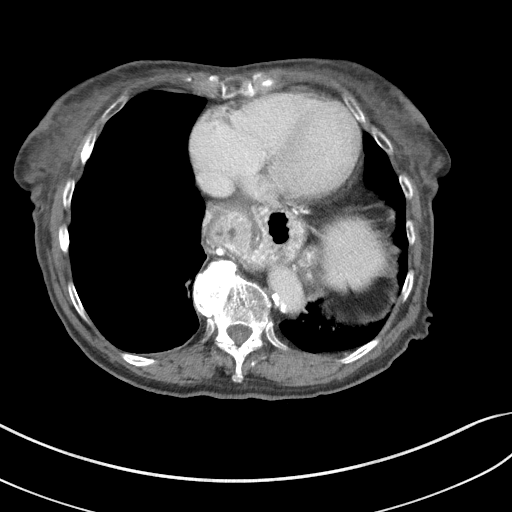

[Series 5: coronal st · coronal · 0.69mm/px · 3 of 94 slices shown]
[im 32/94  soft-tissue]
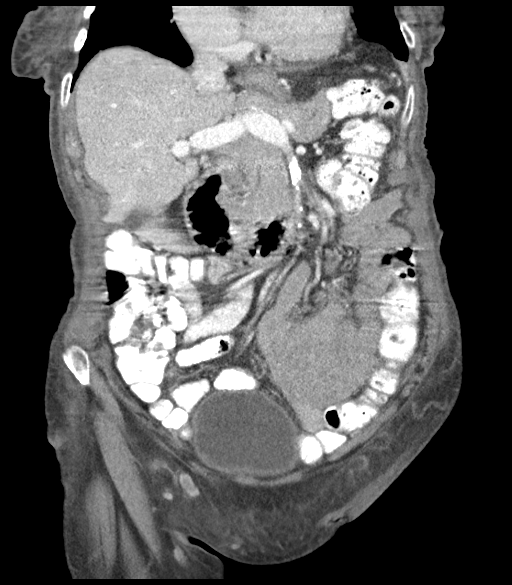
[im 42/94  soft-tissue]
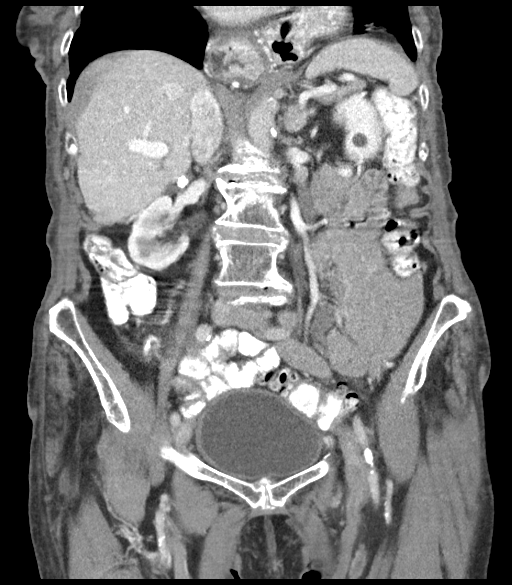
[im 52/94  soft-tissue]
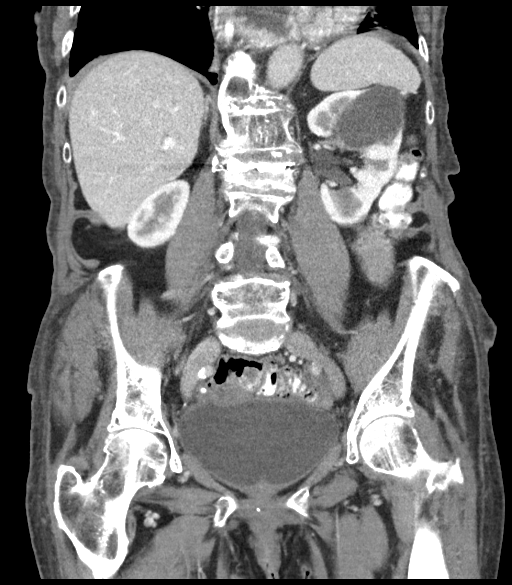

[16 of 46 positions shown; findings below may reference images not displayed]

FINDINGS: Lower chest: No acute pleural or parenchymal lung disease. Large
hiatal hernia is noted.

Hepatobiliary: Calcified gallstones are identified without
cholecystitis. The liver is unremarkable without focal abnormality.
No biliary duct dilation.

Pancreas: Unremarkable. No pancreatic ductal dilatation or
surrounding inflammatory changes.

Spleen: Normal in size without focal abnormality.

Adrenals/Urinary Tract: Simple left renal cysts, largest measuring
up to 7 cm. No urinary tract calculi or obstructive uropathy within
either kidney. The adrenals and bladder are unremarkable.

Stomach/Bowel: No bowel obstruction or ileus. Normal appendix right
lower quadrant. Significant retained stool within the rectosigmoid
colon consistent with constipation. Diverticulosis of the sigmoid
colon without diverticulitis.

Vascular/Lymphatic: Aortic atherosclerosis. No enlarged abdominal or
pelvic lymph nodes.

Reproductive: Uterus and bilateral adnexa are unremarkable. Pessary
is identified.

Other: No free fluid or free gas.  No abdominal wall hernia.

Musculoskeletal: No acute or destructive bony lesions. Chronic L2
compression fracture in vertebra plana, with evidence of prior
vertebral augmentation. Grade 1 anterolisthesis of L4 on L5 due to
facet hypertrophy. No pars defect. Reconstructed images demonstrate
no additional findings.
IMPRESSION: 1. Moderate retained stool within the rectosigmoid colon consistent
with constipation.
2. Sigmoid diverticulosis without diverticulitis.
3. Large hiatal hernia.
4.  Aortic Atherosclerosis ([id number].B).
